# Patient Record
Sex: Male | Born: 1935 | Race: White | Hispanic: No | Marital: Married | Smoking: Former smoker
Health system: Southern US, Community
[De-identification: ages and names within clinical notes are randomized; demographics above are authoritative.]

## PROBLEM LIST (undated history)

## (undated) DIAGNOSIS — E119 Type 2 diabetes mellitus without complications: Secondary | ICD-10-CM

## (undated) DIAGNOSIS — R112 Nausea with vomiting, unspecified: Secondary | ICD-10-CM

## (undated) DIAGNOSIS — I714 Abdominal aortic aneurysm, without rupture, unspecified: Secondary | ICD-10-CM

## (undated) DIAGNOSIS — I639 Cerebral infarction, unspecified: Secondary | ICD-10-CM

## (undated) DIAGNOSIS — I10 Essential (primary) hypertension: Secondary | ICD-10-CM

## (undated) DIAGNOSIS — C801 Malignant (primary) neoplasm, unspecified: Secondary | ICD-10-CM

## (undated) DIAGNOSIS — I219 Acute myocardial infarction, unspecified: Secondary | ICD-10-CM

## (undated) DIAGNOSIS — I2510 Atherosclerotic heart disease of native coronary artery without angina pectoris: Secondary | ICD-10-CM

## (undated) DIAGNOSIS — Z9889 Other specified postprocedural states: Secondary | ICD-10-CM

## (undated) DIAGNOSIS — N189 Chronic kidney disease, unspecified: Secondary | ICD-10-CM

## (undated) DIAGNOSIS — I739 Peripheral vascular disease, unspecified: Secondary | ICD-10-CM

## (undated) DIAGNOSIS — S02609A Fracture of mandible, unspecified, initial encounter for closed fracture: Secondary | ICD-10-CM

## (undated) DIAGNOSIS — I701 Atherosclerosis of renal artery: Secondary | ICD-10-CM

## (undated) HISTORY — PX: MANDIBLE FRACTURE SURGERY: SHX706

## (undated) HISTORY — PX: RENAL ARTERY STENT: SHX2321

---

## 1974-11-14 DIAGNOSIS — C801 Malignant (primary) neoplasm, unspecified: Secondary | ICD-10-CM

## 1974-11-14 HISTORY — DX: Malignant (primary) neoplasm, unspecified: C80.1

## 2008-11-14 HISTORY — PX: CORONARY ARTERY BYPASS GRAFT: SHX141

## 2009-06-11 ENCOUNTER — Inpatient Hospital Stay: Payer: Self-pay | Admitting: Internal Medicine

## 2009-09-23 ENCOUNTER — Encounter: Payer: Self-pay | Admitting: Internal Medicine

## 2009-10-14 ENCOUNTER — Encounter: Payer: Self-pay | Admitting: Internal Medicine

## 2009-11-14 ENCOUNTER — Encounter: Payer: Self-pay | Admitting: Internal Medicine

## 2009-11-14 DIAGNOSIS — N189 Chronic kidney disease, unspecified: Secondary | ICD-10-CM

## 2009-11-14 HISTORY — DX: Chronic kidney disease, unspecified: N18.9

## 2012-04-04 DIAGNOSIS — I739 Peripheral vascular disease, unspecified: Secondary | ICD-10-CM | POA: Insufficient documentation

## 2012-04-04 DIAGNOSIS — M1990 Unspecified osteoarthritis, unspecified site: Secondary | ICD-10-CM | POA: Insufficient documentation

## 2012-04-04 DIAGNOSIS — E785 Hyperlipidemia, unspecified: Secondary | ICD-10-CM | POA: Insufficient documentation

## 2012-04-04 DIAGNOSIS — Z85820 Personal history of malignant melanoma of skin: Secondary | ICD-10-CM | POA: Insufficient documentation

## 2012-04-04 DIAGNOSIS — I2510 Atherosclerotic heart disease of native coronary artery without angina pectoris: Secondary | ICD-10-CM | POA: Insufficient documentation

## 2012-04-04 DIAGNOSIS — J329 Chronic sinusitis, unspecified: Secondary | ICD-10-CM | POA: Insufficient documentation

## 2012-04-04 DIAGNOSIS — I714 Abdominal aortic aneurysm, without rupture: Secondary | ICD-10-CM | POA: Insufficient documentation

## 2012-04-04 DIAGNOSIS — K5790 Diverticulosis of intestine, part unspecified, without perforation or abscess without bleeding: Secondary | ICD-10-CM | POA: Insufficient documentation

## 2012-04-04 DIAGNOSIS — I701 Atherosclerosis of renal artery: Secondary | ICD-10-CM | POA: Insufficient documentation

## 2012-04-04 DIAGNOSIS — Z87891 Personal history of nicotine dependence: Secondary | ICD-10-CM | POA: Insufficient documentation

## 2012-05-14 ENCOUNTER — Other Ambulatory Visit: Payer: Self-pay | Admitting: Sports Medicine

## 2012-05-14 LAB — SYNOVIAL FLUID, CRYSTAL: Crystals, Joint Fluid: NONE SEEN

## 2012-05-14 LAB — BODY FLUID CELL COUNT WITH DIFFERENTIAL
Basophil: 0 %
Eosinophil: 0 %
Lymphocytes: 9 %
Neutrophils: 78 %
Nucleated Cell Count: 918 /mm3
Other Cells BF: 0 %
Other Mononuclear Cells: 13 %

## 2012-05-18 LAB — BODY FLUID CULTURE

## 2012-09-24 ENCOUNTER — Other Ambulatory Visit: Payer: Self-pay | Admitting: Sports Medicine

## 2012-09-28 LAB — WOUND CULTURE

## 2012-11-14 HISTORY — PX: CATARACT EXTRACTION: SUR2

## 2012-12-04 ENCOUNTER — Other Ambulatory Visit: Payer: Self-pay | Admitting: Podiatry

## 2012-12-08 LAB — WOUND CULTURE

## 2015-07-02 ENCOUNTER — Encounter
Admission: RE | Admit: 2015-07-02 | Discharge: 2015-07-02 | Disposition: A | Discharge status: Home / Self Care | Payer: Medicare Other | Source: Ambulatory Visit | Attending: Surgery | Admitting: Surgery

## 2015-07-02 DIAGNOSIS — K4090 Unilateral inguinal hernia, without obstruction or gangrene, not specified as recurrent: Secondary | ICD-10-CM | POA: Insufficient documentation

## 2015-07-02 DIAGNOSIS — Z01818 Encounter for other preprocedural examination: Secondary | ICD-10-CM | POA: Diagnosis not present

## 2015-07-02 HISTORY — DX: Abdominal aortic aneurysm, without rupture: I71.4

## 2015-07-02 HISTORY — DX: Type 2 diabetes mellitus without complications: E11.9

## 2015-07-02 HISTORY — DX: Other specified postprocedural states: Z98.890

## 2015-07-02 HISTORY — DX: Fracture of mandible, unspecified, initial encounter for closed fracture: S02.609A

## 2015-07-02 HISTORY — DX: Acute myocardial infarction, unspecified: I21.9

## 2015-07-02 HISTORY — DX: Peripheral vascular disease, unspecified: I73.9

## 2015-07-02 HISTORY — DX: Malignant (primary) neoplasm, unspecified: C80.1

## 2015-07-02 HISTORY — DX: Essential (primary) hypertension: I10

## 2015-07-02 HISTORY — DX: Chronic kidney disease, unspecified: N18.9

## 2015-07-02 HISTORY — DX: Nausea with vomiting, unspecified: R11.2

## 2015-07-02 HISTORY — DX: Atherosclerotic heart disease of native coronary artery without angina pectoris: I25.10

## 2015-07-02 HISTORY — DX: Atherosclerosis of renal artery: I70.1

## 2015-07-02 HISTORY — DX: Abdominal aortic aneurysm, without rupture, unspecified: I71.40

## 2015-07-02 LAB — CBC
HCT: 45.8 % (ref 40.0–52.0)
HEMOGLOBIN: 15 g/dL (ref 13.0–18.0)
MCH: 29.4 pg (ref 26.0–34.0)
MCHC: 32.8 g/dL (ref 32.0–36.0)
MCV: 89.7 fL (ref 80.0–100.0)
PLATELETS: 141 10*3/uL — AB (ref 150–440)
RBC: 5.11 MIL/uL (ref 4.40–5.90)
RDW: 15.9 % — ABNORMAL HIGH (ref 11.5–14.5)
WBC: 6.7 10*3/uL (ref 3.8–10.6)

## 2015-07-02 LAB — BASIC METABOLIC PANEL
Anion gap: 7 (ref 5–15)
BUN: 25 mg/dL — AB (ref 6–20)
CHLORIDE: 106 mmol/L (ref 101–111)
CO2: 28 mmol/L (ref 22–32)
Calcium: 9.3 mg/dL (ref 8.9–10.3)
Creatinine, Ser: 1.55 mg/dL — ABNORMAL HIGH (ref 0.61–1.24)
GFR calc non Af Amer: 42 mL/min — ABNORMAL LOW (ref >60)
GFR, EST AFRICAN AMERICAN: 48 mL/min — AB (ref >60)
Glucose, Bld: 129 mg/dL — ABNORMAL HIGH (ref 65–99)
POTASSIUM: 4.2 mmol/L (ref 3.5–5.1)
SODIUM: 141 mmol/L (ref 135–145)

## 2015-07-02 LAB — DIFFERENTIAL
Basophils Absolute: 0 10*3/uL (ref 0–0.1)
Basophils Relative: 1 %
Eosinophils Absolute: 0.1 10*3/uL (ref 0–0.7)
Eosinophils Relative: 2 %
LYMPHS PCT: 27 %
Lymphs Abs: 1.8 10*3/uL (ref 1.0–3.6)
MONO ABS: 0.7 10*3/uL (ref 0.2–1.0)
Monocytes Relative: 10 %
NEUTROS ABS: 4.1 10*3/uL (ref 1.4–6.5)
Neutrophils Relative %: 60 %

## 2015-07-02 NOTE — Patient Instructions (Signed)
  Your procedure is scheduled on: @ADMITDT2 @ August 25 Report to .[facility] Entrance To find out your arrival time please call [phone] between 1PM - 3PM on August 24.  Remember: Instructions that are not followed completely may result in serious medical risk, up to and including death, or upon the discretion of your surgeon and anesthesiologist your surgery may need to be rescheduled.    ___x_ 1. Do not eat food or drink liquids after midnight. No gum chewing or hard candies.     ___x_ 2. No Alcohol for 24 hours before or after surgery.   ____ 3. Bring all medications with you on the day of surgery if instructed.    ___x_ 4. Notify your doctor if there is any change in your medical condition     (cold, fever, infections).     Do not wear jewelry, make-up, hairpins, clips or nail polish.  Do not wear lotions, powders, or perfumes. You may wear deodorant.  Do not shave 48 hours prior to surgery. Men may shave face and neck.  Do not bring valuables to the hospital.    [facility] is not responsible for any belongings or valuables.               Contacts, dentures or bridgework may not be worn into surgery.  Leave your suitcase in the car. After surgery it may be brought to your room.  For patients admitted to the hospital, discharge time is determined by your                treatment team.   Patients discharged the day of surgery will not be allowed to drive home.   Please read over the following fact sheets that you were given:      ___x_ Take these medicines the morning of surgery with A SIP OF WATER:    1. Amlodipine  2. Carvedilol  3.   4.  5.  6.  ____ Fleet Enema (as directed)   __x__ Use CHG Soap as directed  ____ Use inhalers on the day of surgery  ____ Stop metformin 2 days prior to surgery    ____ Take 1/2 of usual insulin dose the night before surgery and none on the morning of surgery.   ___x_ Stop Coumadin/Plavix/aspirin on July 02 2015  ___x_  Stop Anti-inflammatories on July 02 2015   __x__ Stop supplements until after surgery.  Ocuvite  ____ Bring C-Pap to the hospital.

## 2015-07-02 NOTE — OR Nursing (Signed)
CLEARED BY DR Ubaldo Glassing 06/19/15

## 2015-07-09 ENCOUNTER — Encounter: Payer: Self-pay | Admitting: *Deleted

## 2015-07-09 ENCOUNTER — Ambulatory Visit: Payer: Medicare Other | Admitting: Anesthesiology

## 2015-07-09 ENCOUNTER — Ambulatory Visit
Admission: RE | Admit: 2015-07-09 | Discharge: 2015-07-09 | Disposition: A | Discharge status: Home / Self Care | Payer: Medicare Other | Source: Ambulatory Visit | Attending: Surgery | Admitting: Surgery

## 2015-07-09 ENCOUNTER — Encounter
Admission: RE | Disposition: A | Discharge status: Home / Self Care | Payer: Self-pay | Source: Ambulatory Visit | Attending: Surgery

## 2015-07-09 DIAGNOSIS — E119 Type 2 diabetes mellitus without complications: Secondary | ICD-10-CM | POA: Insufficient documentation

## 2015-07-09 DIAGNOSIS — I2510 Atherosclerotic heart disease of native coronary artery without angina pectoris: Secondary | ICD-10-CM | POA: Insufficient documentation

## 2015-07-09 DIAGNOSIS — K4090 Unilateral inguinal hernia, without obstruction or gangrene, not specified as recurrent: Secondary | ICD-10-CM | POA: Diagnosis not present

## 2015-07-09 DIAGNOSIS — Z87891 Personal history of nicotine dependence: Secondary | ICD-10-CM | POA: Diagnosis not present

## 2015-07-09 DIAGNOSIS — I10 Essential (primary) hypertension: Secondary | ICD-10-CM | POA: Insufficient documentation

## 2015-07-09 DIAGNOSIS — I252 Old myocardial infarction: Secondary | ICD-10-CM | POA: Insufficient documentation

## 2015-07-09 DIAGNOSIS — I701 Atherosclerosis of renal artery: Secondary | ICD-10-CM | POA: Insufficient documentation

## 2015-07-09 HISTORY — PX: INGUINAL HERNIA REPAIR: SHX194

## 2015-07-09 LAB — GLUCOSE, CAPILLARY
GLUCOSE-CAPILLARY: 81 mg/dL (ref 65–99)
GLUCOSE-CAPILLARY: 111 mg/dL — AB (ref 65–99)

## 2015-07-09 SURGERY — REPAIR, HERNIA, INGUINAL, ADULT
Anesthesia: General | Site: Groin | Laterality: Left | Wound class: Clean

## 2015-07-09 MED ORDER — BUPIVACAINE-EPINEPHRINE (PF) 0.5% -1:200000 IJ SOLN
INTRAMUSCULAR | Status: AC
  Filled 2015-07-09: qty 30

## 2015-07-09 MED ORDER — ROCURONIUM BROMIDE 100 MG/10ML IV SOLN
INTRAVENOUS | Status: DC | PRN
  Administered 2015-07-09: 5 mg via INTRAVENOUS

## 2015-07-09 MED ORDER — EPHEDRINE SULFATE 50 MG/ML IJ SOLN
INTRAMUSCULAR | Status: DC | PRN
  Administered 2015-07-09: 5 mg via INTRAVENOUS

## 2015-07-09 MED ORDER — FAMOTIDINE 20 MG PO TABS
ORAL_TABLET | ORAL | Status: AC
  Administered 2015-07-09: 20 mg via ORAL
  Filled 2015-07-09: qty 1

## 2015-07-09 MED ORDER — FAMOTIDINE 20 MG PO TABS
20.0000 mg | ORAL_TABLET | Freq: Once | ORAL | Status: AC
  Administered 2015-07-09: 20 mg via ORAL

## 2015-07-09 MED ORDER — ONDANSETRON HCL 4 MG/2ML IJ SOLN
INTRAMUSCULAR | Status: DC | PRN
  Administered 2015-07-09: 4 mg via INTRAVENOUS

## 2015-07-09 MED ORDER — SODIUM CHLORIDE 0.9 % IV SOLN
INTRAVENOUS | Status: DC
  Administered 2015-07-09: 14:00:00 via INTRAVENOUS

## 2015-07-09 MED ORDER — BUPIVACAINE-EPINEPHRINE 0.5% -1:200000 IJ SOLN
INTRAMUSCULAR | Status: DC | PRN
  Administered 2015-07-09: 20 mL

## 2015-07-09 MED ORDER — CEFAZOLIN SODIUM 1-5 GM-% IV SOLN
INTRAVENOUS | Status: AC
  Filled 2015-07-09: qty 50

## 2015-07-09 MED ORDER — PROPOFOL 10 MG/ML IV BOLUS
INTRAVENOUS | Status: DC | PRN
  Administered 2015-07-09: 40 mg via INTRAVENOUS
  Administered 2015-07-09: 120 mg via INTRAVENOUS

## 2015-07-09 MED ORDER — SUCCINYLCHOLINE CHLORIDE 20 MG/ML IJ SOLN
INTRAMUSCULAR | Status: DC | PRN
  Administered 2015-07-09: 100 mg via INTRAVENOUS

## 2015-07-09 MED ORDER — FENTANYL CITRATE (PF) 100 MCG/2ML IJ SOLN
25.0000 ug | INTRAMUSCULAR | Status: DC | PRN
  Administered 2015-07-09 (×4): 25 ug via INTRAVENOUS

## 2015-07-09 MED ORDER — GLYCOPYRROLATE 0.2 MG/ML IJ SOLN
INTRAMUSCULAR | Status: DC | PRN
  Administered 2015-07-09: 0.1 mg via INTRAVENOUS

## 2015-07-09 MED ORDER — FENTANYL CITRATE (PF) 100 MCG/2ML IJ SOLN
INTRAMUSCULAR | Status: AC
  Administered 2015-07-09: 25 ug via INTRAVENOUS
  Filled 2015-07-09: qty 2

## 2015-07-09 MED ORDER — ONDANSETRON HCL 4 MG/2ML IJ SOLN: 4.0000 mg | Freq: Once | INTRAMUSCULAR | Status: DC | PRN

## 2015-07-09 MED ORDER — LIDOCAINE HCL (CARDIAC) 20 MG/ML IV SOLN
INTRAVENOUS | Status: DC | PRN
  Administered 2015-07-09: 100 mg via INTRAVENOUS

## 2015-07-09 MED ORDER — FENTANYL CITRATE (PF) 100 MCG/2ML IJ SOLN
INTRAMUSCULAR | Status: DC | PRN
  Administered 2015-07-09: 25 ug via INTRAVENOUS
  Administered 2015-07-09: 50 ug via INTRAVENOUS
  Administered 2015-07-09: 25 ug via INTRAVENOUS
  Administered 2015-07-09: 50 ug via INTRAVENOUS

## 2015-07-09 MED ORDER — HYDROCODONE-ACETAMINOPHEN 5-325 MG PO TABS: 1.0000 | ORAL_TABLET | ORAL | Status: DC | PRN

## 2015-07-09 MED ORDER — MIDAZOLAM HCL 2 MG/2ML IJ SOLN
INTRAMUSCULAR | Status: DC | PRN
  Administered 2015-07-09: 2 mg via INTRAVENOUS

## 2015-07-09 MED ORDER — DEXTROSE 5 % IV SOLN
1000.0000 mg | Freq: Once | INTRAVENOUS | Status: AC
  Administered 2015-07-09: 1000 mg via INTRAVENOUS

## 2015-07-09 SURGICAL SUPPLY — 24 items
BLADE SURG 15 STRL LF DISP TIS (BLADE) ×2 IMPLANT
BLADE SURG 15 STRL SS (BLADE) ×4
CANISTER SUCT 1200ML W/VALVE (MISCELLANEOUS) ×3 IMPLANT
CHLORAPREP W/TINT 26ML (MISCELLANEOUS) ×3 IMPLANT
DRAIN PENROSE 5/8X18 LTX STRL (WOUND CARE) ×3 IMPLANT
DRAPE PED LAPAROTOMY (DRAPES) ×3 IMPLANT
GLOVE BIO SURGEON STRL SZ7.5 (GLOVE) ×3 IMPLANT
GOWN STRL REUS W/ TWL LRG LVL3 (GOWN DISPOSABLE) ×2 IMPLANT
GOWN STRL REUS W/TWL LRG LVL3 (GOWN DISPOSABLE) ×4
KIT RM TURNOVER STRD PROC AR (KITS) ×3 IMPLANT
LABEL OR SOLS (LABEL) ×3 IMPLANT
LIQUID BAND (GAUZE/BANDAGES/DRESSINGS) ×3 IMPLANT
MESH SYNTHETIC 4X6 SOFT BARD (Mesh General) ×1 IMPLANT
MESH SYNTHETIC SOFT BARD 4X6 (Mesh General) ×2 IMPLANT
NEEDLE HYPO 25X1 1.5 SAFETY (NEEDLE) ×3 IMPLANT
NS IRRIG 500ML POUR BTL (IV SOLUTION) ×3 IMPLANT
PACK BASIN MINOR ARMC (MISCELLANEOUS) ×3 IMPLANT
PAD GROUND ADULT SPLIT (MISCELLANEOUS) ×3 IMPLANT
SUT CHROMIC 4 0 RB 1X27 (SUTURE) ×6 IMPLANT
SUT MNCRL AB 4-0 PS2 18 (SUTURE) ×3 IMPLANT
SUT SURGILON 0 30 BLK (SUTURE) ×9 IMPLANT
SUT VIC AB 4-0 SH 27 (SUTURE) ×4
SUT VIC AB 4-0 SH 27XANBCTRL (SUTURE) ×2 IMPLANT
SYRINGE 10CC LL (SYRINGE) ×3 IMPLANT

## 2015-07-09 NOTE — Transfer of Care (Signed)
Immediate Anesthesia Transfer of Care Note  Patient: Nathaniel Gamble  Procedure(s) Performed: Procedure(s): HERNIA REPAIR INGUINAL ADULT (Left)  Patient Location: PACU  Anesthesia Type:General  Level of Consciousness: awake, alert , oriented and patient cooperative  Airway & Oxygen Therapy: Patient Spontanous Breathing and Patient connected to nasal cannula oxygen  Post-op Assessment: Report given to RN and Post -op Vital signs reviewed and stable  Post vital signs: Reviewed and stable  Last Vitals:  Filed Vitals:   07/09/15 1804  BP: 166/94  Pulse: 68  Temp:   Resp: 13    Complications: No apparent anesthesia complications

## 2015-07-09 NOTE — Anesthesia Postprocedure Evaluation (Signed)
  Anesthesia Post-op Note  Patient: Nathaniel Gamble  Procedure(s) Performed: Procedure(s): HERNIA REPAIR INGUINAL ADULT (Left)  Anesthesia type:General  Patient location: PACU  Post pain: Pain level controlled  Post assessment: Post-op Vital signs reviewed, Patient's Cardiovascular Status Stable, Respiratory Function Stable, Patent Airway and No signs of Nausea or vomiting  Post vital signs: Reviewed and stable  Last Vitals:  Filed Vitals:   07/09/15 1756  BP:   Pulse: 70  Temp:   Resp: 12    Level of consciousness: awake, alert  and patient cooperative  Complications: No apparent anesthesia complications

## 2015-07-09 NOTE — Op Note (Signed)
OPERATIVE REPORT  PREOPERATIVE DIAGNOSIS: left inguinal hernia  POSTOPERATIVE DIAGNOSIS:left  inguinal hernia  PROCEDURE:  left inguinal hernia repair  ANESTHESIA:  General  SURGEON:  Rochel Brome M.D.  INDICATIONS: He reports bulging in the left groin. A left inguinal hernia was demonstrated on physical exam and repair was recommended for definitive treatment.  With the patient on the operating table in the supine position the left lower quadrant was prepared with clippers and with ChloraPrep and draped in a sterile manner. A transversely oriented suprapubic incision was made and carried down through subcutaneous tissues. Electrocautery was used for hemostasis. The Scarpa's fascia was incised. The external oblique aponeurosis was incised along the course of its fibers to open the external ring and expose the inguinal cord structures. The cord structures were mobilized. A Penrose drain was passed around the cord structures for traction. Inspection revealed there was a large direct inguinal hernia. There was no indirect sac. The attenuated transversalis fascia was incised circumferentially and removed. The sac was approximately 7 cm in length and was inverted the fascial defect in the floor of the inguinal canal was repaired with interrupted 0 Surgilon sutures.. The conjoined tendon was sutured to the shelving edge of the inguinal ligament incorporating transversalis fascia into the repair. The last stitch led to satisfactory narrowing of the internal ring. The fascia was somewhat thin. A relaxing incision was made medially.  Bard soft mesh was cut to create an oval shape and was placed over the repair. This was sutured to the repair with interrupted 0 Surgilon sutures and also sutured medially to the deep fascia and on both sides of the internal ring. Next after seeing hemostasis was intact the cord structures were replaced along the floor of the inguinal canal. The cut edges of the external oblique  aponeurosis were closed with a running 4-0 Vicryl suture to re-create the external ring. The deep fascia superior and lateral to the repair site was infiltrated with half percent Sensorcaine with epinephrine. Subcutaneous tissues were also infiltrated. The Scarpa's fascia was closed with interrupted 4-0 Vicryl sutures. The skin was closed with running 4-0 Monocryl subcuticular suture and LiquiBand. The testicle remained in the scrotum  The patient appeared to be in satisfactory condition and was prepared for transfer to the recovery room.  Rochel Brome M.D.

## 2015-07-09 NOTE — Anesthesia Procedure Notes (Signed)
Procedure Name: Intubation Date/Time: 07/09/2015 3:50 PM Performed by: Silvana Newness Pre-anesthesia Checklist: Patient identified, Emergency Drugs available, Suction available, Patient being monitored and Timeout performed Patient Re-evaluated:Patient Re-evaluated prior to inductionOxygen Delivery Method: Circle system utilized Preoxygenation: Pre-oxygenation with 100% oxygen Intubation Type: IV induction Ventilation: Two handed mask ventilation required Laryngoscope Size: Glidescope and 4 Grade View: Grade I Tube type: Oral Tube size: 7.5 mm Number of attempts: 1 Airway Equipment and Method: Rigid stylet Placement Confirmation: ETT inserted through vocal cords under direct vision,  positive ETCO2 and breath sounds checked- equal and bilateral Secured at: 20 cm Tube secured with: Tape Dental Injury: Teeth and Oropharynx as per pre-operative assessment

## 2015-07-09 NOTE — H&P (Signed)
  He reports no change in overall condition since the day of the office visit.  The left inguinal hernia was demonstrated on physical exam.  The left side was marked yes  Plan for surgery was discussed

## 2015-07-09 NOTE — Anesthesia Preprocedure Evaluation (Addendum)
Anesthesia Evaluation  Patient identified by MRN, date of birth, ID band Patient awake    Reviewed: Allergy & Precautions, NPO status , Patient's Chart, lab work & pertinent test results, reviewed documented beta blocker date and time   History of Anesthesia Complications (+) PONV  Airway Mallampati: III  TM Distance: >3 FB   Mouth opening: Limited Mouth Opening  Dental no notable dental hx.    Pulmonary former smoker,  breath sounds clear to auscultation  Pulmonary exam normal       Cardiovascular hypertension, Pt. on medications and Pt. on home beta blockers + CAD, + Past MI and + Peripheral Vascular Disease Normal cardiovascular exam    Neuro/Psych negative neurological ROS  negative psych ROS   GI/Hepatic negative GI ROS, Neg liver ROS,   Endo/Other  diabetes, Well Controlled, Type 2  Renal/GU Renal InsufficiencyRenal diseaseRenal artery stenosis  negative genitourinary   Musculoskeletal negative musculoskeletal ROS (+)   Abdominal Normal abdominal exam  (+)   Peds negative pediatric ROS (+)  Hematology negative hematology ROS (+)   Anesthesia Other Findings   Reproductive/Obstetrics                            Anesthesia Physical Anesthesia Plan  ASA: III  Anesthesia Plan: General   Post-op Pain Management:    Induction: Intravenous  Airway Management Planned: Oral ETT  Additional Equipment:   Intra-op Plan:   Post-operative Plan: Extubation in OR  Informed Consent: I have reviewed the patients History and Physical, chart, labs and discussed the procedure including the risks, benefits and alternatives for the proposed anesthesia with the patient or authorized representative who has indicated his/her understanding and acceptance.   Dental advisory given  Plan Discussed with: CRNA and Surgeon  Anesthesia Plan Comments:         Anesthesia Quick Evaluation

## 2015-07-09 NOTE — Discharge Instructions (Addendum)
Take Tylenol or Norco if needed for pain.  Resume aspirin on Monday.  May shower.  Avoid straining and heavy lifting.general anesGeneral Anesthesia, Care After Refer to this sheet in the next few weeks. These instructions provide you with information on caring for yourself after your procedure. Your health care provider may also give you more specific instructions. Your treatment has been planned according to current medical practices, but problems sometimes occur. Call your health care provider if you have any problems or questions after your procedure. WHAT TO EXPECT AFTER THE PROCEDURE After the procedure, it is typical to experience:  Sleepiness.  Nausea and vomiting. HOME CARE INSTRUCTIONS  For the first 24 hours after general anesthesia:  Have a responsible person with you.  Do not drive a car. If you are alone, do not take public transportation.  Do not drink alcohol.  Do not take medicine that has not been prescribed by your health care provider.  Do not sign important papers or make important decisions.  You may resume a normal diet and activities as directed by your health care provider.  Change bandages (dressings) as directed.  If you have questions or problems that seem related to general anesthesia, call the hospital and ask for the anesthetist or anesthesiologist on call. SEEK MEDICAL CARE IF:  You have nausea and vomiting that continue the day after anesthesia.  You develop a rash. SEEK IMMEDIATE MEDICAL CARE IF:   You have difficulty breathing.  You have chest pain.  You have any allergic problems. Document Released: 02/06/2001 Document Revised: 11/05/2013 Document Reviewed: 05/16/2013 [facility] Patient Information 2015 [place], [place]. This information is not intended to replace advice given to you by your health care provider. Make sure you discuss any questions you have with your health care provider.

## 2015-07-10 ENCOUNTER — Encounter: Payer: Self-pay | Admitting: Surgery

## 2016-10-31 ENCOUNTER — Other Ambulatory Visit: Payer: Self-pay | Admitting: Nephrology

## 2016-10-31 DIAGNOSIS — N2889 Other specified disorders of kidney and ureter: Secondary | ICD-10-CM

## 2016-11-04 ENCOUNTER — Ambulatory Visit
Admission: RE | Admit: 2016-11-04 | Discharge: 2016-11-04 | Disposition: A | Discharge status: Home / Self Care | Payer: BLUE CROSS/BLUE SHIELD | Source: Ambulatory Visit | Attending: Nephrology | Admitting: Nephrology

## 2016-11-04 DIAGNOSIS — I728 Aneurysm of other specified arteries: Secondary | ICD-10-CM | POA: Diagnosis not present

## 2016-11-04 DIAGNOSIS — I714 Abdominal aortic aneurysm, without rupture: Secondary | ICD-10-CM | POA: Diagnosis not present

## 2016-11-04 DIAGNOSIS — K229 Disease of esophagus, unspecified: Secondary | ICD-10-CM | POA: Insufficient documentation

## 2016-11-04 DIAGNOSIS — N2889 Other specified disorders of kidney and ureter: Secondary | ICD-10-CM | POA: Diagnosis not present

## 2018-05-10 ENCOUNTER — Inpatient Hospital Stay: Payer: BLUE CROSS/BLUE SHIELD

## 2018-05-10 ENCOUNTER — Inpatient Hospital Stay
Admission: EM | Admit: 2018-05-10 | Discharge: 2018-05-11 | DRG: 066 | Disposition: A | Discharge status: Home Health | Payer: BLUE CROSS/BLUE SHIELD | Attending: Internal Medicine | Admitting: Internal Medicine

## 2018-05-10 ENCOUNTER — Encounter: Payer: Self-pay | Admitting: Emergency Medicine

## 2018-05-10 ENCOUNTER — Other Ambulatory Visit: Payer: Self-pay

## 2018-05-10 ENCOUNTER — Emergency Department: Payer: BLUE CROSS/BLUE SHIELD

## 2018-05-10 DIAGNOSIS — I2510 Atherosclerotic heart disease of native coronary artery without angina pectoris: Secondary | ICD-10-CM | POA: Diagnosis present

## 2018-05-10 DIAGNOSIS — G8324 Monoplegia of upper limb affecting left nondominant side: Secondary | ICD-10-CM | POA: Diagnosis present

## 2018-05-10 DIAGNOSIS — N183 Chronic kidney disease, stage 3 (moderate): Secondary | ICD-10-CM | POA: Diagnosis present

## 2018-05-10 DIAGNOSIS — I252 Old myocardial infarction: Secondary | ICD-10-CM

## 2018-05-10 DIAGNOSIS — E1151 Type 2 diabetes mellitus with diabetic peripheral angiopathy without gangrene: Secondary | ICD-10-CM | POA: Diagnosis present

## 2018-05-10 DIAGNOSIS — E1122 Type 2 diabetes mellitus with diabetic chronic kidney disease: Secondary | ICD-10-CM | POA: Diagnosis present

## 2018-05-10 DIAGNOSIS — Z823 Family history of stroke: Secondary | ICD-10-CM | POA: Diagnosis not present

## 2018-05-10 DIAGNOSIS — Z85820 Personal history of malignant melanoma of skin: Secondary | ICD-10-CM

## 2018-05-10 DIAGNOSIS — I10 Essential (primary) hypertension: Secondary | ICD-10-CM | POA: Diagnosis not present

## 2018-05-10 DIAGNOSIS — Z7982 Long term (current) use of aspirin: Secondary | ICD-10-CM

## 2018-05-10 DIAGNOSIS — Z87891 Personal history of nicotine dependence: Secondary | ICD-10-CM | POA: Diagnosis not present

## 2018-05-10 DIAGNOSIS — R29810 Facial weakness: Secondary | ICD-10-CM | POA: Diagnosis present

## 2018-05-10 DIAGNOSIS — E119 Type 2 diabetes mellitus without complications: Secondary | ICD-10-CM | POA: Diagnosis not present

## 2018-05-10 DIAGNOSIS — I701 Atherosclerosis of renal artery: Secondary | ICD-10-CM | POA: Diagnosis present

## 2018-05-10 DIAGNOSIS — Z9841 Cataract extraction status, right eye: Secondary | ICD-10-CM | POA: Diagnosis not present

## 2018-05-10 DIAGNOSIS — Z951 Presence of aortocoronary bypass graft: Secondary | ICD-10-CM | POA: Diagnosis not present

## 2018-05-10 DIAGNOSIS — R29702 NIHSS score 2: Secondary | ICD-10-CM | POA: Diagnosis present

## 2018-05-10 DIAGNOSIS — Z888 Allergy status to other drugs, medicaments and biological substances status: Secondary | ICD-10-CM | POA: Diagnosis not present

## 2018-05-10 DIAGNOSIS — Z9842 Cataract extraction status, left eye: Secondary | ICD-10-CM

## 2018-05-10 DIAGNOSIS — Z8679 Personal history of other diseases of the circulatory system: Secondary | ICD-10-CM | POA: Diagnosis not present

## 2018-05-10 DIAGNOSIS — E785 Hyperlipidemia, unspecified: Secondary | ICD-10-CM | POA: Diagnosis present

## 2018-05-10 DIAGNOSIS — M6281 Muscle weakness (generalized): Secondary | ICD-10-CM | POA: Diagnosis not present

## 2018-05-10 DIAGNOSIS — Z887 Allergy status to serum and vaccine status: Secondary | ICD-10-CM

## 2018-05-10 DIAGNOSIS — I129 Hypertensive chronic kidney disease with stage 1 through stage 4 chronic kidney disease, or unspecified chronic kidney disease: Secondary | ICD-10-CM | POA: Diagnosis present

## 2018-05-10 DIAGNOSIS — I639 Cerebral infarction, unspecified: Principal | ICD-10-CM | POA: Diagnosis present

## 2018-05-10 DIAGNOSIS — I6521 Occlusion and stenosis of right carotid artery: Secondary | ICD-10-CM | POA: Diagnosis not present

## 2018-05-10 LAB — COMPREHENSIVE METABOLIC PANEL
ALT: 19 U/L (ref 0–44)
AST: 25 U/L (ref 15–41)
Albumin: 4.1 g/dL (ref 3.5–5.0)
Alkaline Phosphatase: 74 U/L (ref 38–126)
Anion gap: 9 (ref 5–15)
BUN: 32 mg/dL — AB (ref 8–23)
CHLORIDE: 107 mmol/L (ref 98–111)
CO2: 21 mmol/L — ABNORMAL LOW (ref 22–32)
Calcium: 9.1 mg/dL (ref 8.9–10.3)
Creatinine, Ser: 1.3 mg/dL — ABNORMAL HIGH (ref 0.61–1.24)
GFR calc Af Amer: 59 mL/min — ABNORMAL LOW (ref >60)
GFR calc non Af Amer: 51 mL/min — ABNORMAL LOW (ref >60)
GLUCOSE: 108 mg/dL — AB (ref 70–99)
POTASSIUM: 4.3 mmol/L (ref 3.5–5.1)
Sodium: 137 mmol/L (ref 135–145)
Total Bilirubin: 0.8 mg/dL (ref 0.3–1.2)
Total Protein: 7.3 g/dL (ref 6.5–8.1)

## 2018-05-10 LAB — URINE DRUG SCREEN, QUALITATIVE (ARMC ONLY)
Amphetamines, Ur Screen: NOT DETECTED
Benzodiazepine, Ur Scrn: NOT DETECTED
COCAINE METABOLITE, UR ~~LOC~~: NOT DETECTED
Cannabinoid 50 Ng, Ur ~~LOC~~: NOT DETECTED
MDMA (ECSTASY) UR SCREEN: NOT DETECTED
METHADONE SCREEN, URINE: NOT DETECTED
Opiate, Ur Screen: NOT DETECTED
Phencyclidine (PCP) Ur S: NOT DETECTED
Tricyclic, Ur Screen: NOT DETECTED

## 2018-05-10 LAB — DIFFERENTIAL
BASOS ABS: 0 10*3/uL (ref 0–0.1)
BASOS PCT: 1 %
EOS ABS: 0.1 10*3/uL (ref 0–0.7)
Eosinophils Relative: 2 %
Lymphocytes Relative: 29 %
Lymphs Abs: 2.1 10*3/uL (ref 1.0–3.6)
Monocytes Absolute: 0.9 10*3/uL (ref 0.2–1.0)
Monocytes Relative: 12 %
NEUTROS PCT: 56 %
Neutro Abs: 4.1 10*3/uL (ref 1.4–6.5)

## 2018-05-10 LAB — PROTIME-INR
INR: 1.05
Prothrombin Time: 13.6 seconds (ref 11.4–15.2)

## 2018-05-10 LAB — URINALYSIS, ROUTINE W REFLEX MICROSCOPIC
BACTERIA UA: NONE SEEN
BILIRUBIN URINE: NEGATIVE
GLUCOSE, UA: NEGATIVE mg/dL
HGB URINE DIPSTICK: NEGATIVE
KETONES UR: NEGATIVE mg/dL
LEUKOCYTES UA: NEGATIVE
NITRITE: NEGATIVE
PROTEIN: 30 mg/dL — AB
Specific Gravity, Urine: 1.013 (ref 1.005–1.030)
Squamous Epithelial / LPF: NONE SEEN (ref 0–5)
pH: 5 (ref 5.0–8.0)

## 2018-05-10 LAB — CBC
HCT: 43.7 % (ref 40.0–52.0)
Hemoglobin: 14.8 g/dL (ref 13.0–18.0)
MCH: 30.9 pg (ref 26.0–34.0)
MCHC: 34 g/dL (ref 32.0–36.0)
MCV: 91.1 fL (ref 80.0–100.0)
PLATELETS: 138 10*3/uL — AB (ref 150–440)
RBC: 4.79 MIL/uL (ref 4.40–5.90)
RDW: 14.8 % — ABNORMAL HIGH (ref 11.5–14.5)
WBC: 7.1 10*3/uL (ref 3.8–10.6)

## 2018-05-10 LAB — APTT: APTT: 29 s (ref 24–36)

## 2018-05-10 LAB — GLUCOSE, CAPILLARY
GLUCOSE-CAPILLARY: 99 mg/dL (ref 70–99)
Glucose-Capillary: 98 mg/dL (ref 70–99)

## 2018-05-10 LAB — TROPONIN I

## 2018-05-10 MED ORDER — SIMVASTATIN 20 MG PO TABS
40.0000 mg | ORAL_TABLET | Freq: Every day | ORAL | Status: DC
  Administered 2018-05-10: 40 mg via ORAL
  Filled 2018-05-10: qty 2

## 2018-05-10 MED ORDER — AMLODIPINE BESYLATE 10 MG PO TABS
10.0000 mg | ORAL_TABLET | Freq: Every day | ORAL | Status: DC
  Administered 2018-05-11: 10:00:00 10 mg via ORAL
  Filled 2018-05-10: qty 1

## 2018-05-10 MED ORDER — ALLOPURINOL 100 MG PO TABS
100.0000 mg | ORAL_TABLET | Freq: Every day | ORAL | Status: DC
  Administered 2018-05-11: 10:00:00 100 mg via ORAL
  Filled 2018-05-10: qty 1

## 2018-05-10 MED ORDER — ASPIRIN EC 81 MG PO TBEC
81.0000 mg | DELAYED_RELEASE_TABLET | Freq: Every day | ORAL | Status: DC
  Administered 2018-05-11: 81 mg via ORAL
  Filled 2018-05-10: qty 1

## 2018-05-10 MED ORDER — IOHEXOL 350 MG/ML SOLN
40.0000 mL | Freq: Once | INTRAVENOUS | Status: AC | PRN
  Administered 2018-05-10: 40 mL via INTRAVENOUS

## 2018-05-10 MED ORDER — ASPIRIN 81 MG PO CHEW
324.0000 mg | CHEWABLE_TABLET | Freq: Once | ORAL | Status: AC
  Administered 2018-05-10: 324 mg via ORAL
  Filled 2018-05-10: qty 4

## 2018-05-10 MED ORDER — LOSARTAN POTASSIUM 50 MG PO TABS
50.0000 mg | ORAL_TABLET | Freq: Every day | ORAL | Status: DC
  Administered 2018-05-11: 50 mg via ORAL
  Filled 2018-05-10: qty 1

## 2018-05-10 MED ORDER — POLYETHYLENE GLYCOL 3350 17 G PO PACK: 17.0000 g | PACK | Freq: Every day | ORAL | Status: DC | PRN

## 2018-05-10 MED ORDER — INSULIN ASPART 100 UNIT/ML ~~LOC~~ SOLN: 0.0000 [IU] | Freq: Three times a day (TID) | SUBCUTANEOUS | Status: DC

## 2018-05-10 MED ORDER — ACETAMINOPHEN 325 MG PO TABS
650.0000 mg | ORAL_TABLET | Freq: Four times a day (QID) | ORAL | Status: DC | PRN
  Administered 2018-05-10: 650 mg via ORAL
  Filled 2018-05-10: qty 2

## 2018-05-10 MED ORDER — ACETAMINOPHEN 650 MG RE SUPP: 650.0000 mg | Freq: Four times a day (QID) | RECTAL | Status: DC | PRN

## 2018-05-10 MED ORDER — INSULIN ASPART 100 UNIT/ML ~~LOC~~ SOLN: 0.0000 [IU] | Freq: Every day | SUBCUTANEOUS | Status: DC

## 2018-05-10 MED ORDER — ENOXAPARIN SODIUM 40 MG/0.4ML ~~LOC~~ SOLN
40.0000 mg | SUBCUTANEOUS | Status: DC
  Administered 2018-05-10: 22:00:00 40 mg via SUBCUTANEOUS
  Filled 2018-05-10: qty 0.4

## 2018-05-10 MED ORDER — IOHEXOL 350 MG/ML SOLN
75.0000 mL | Freq: Once | INTRAVENOUS | Status: AC | PRN
  Administered 2018-05-10: 75 mL via INTRAVENOUS

## 2018-05-10 MED ORDER — ALBUTEROL SULFATE (2.5 MG/3ML) 0.083% IN NEBU: 2.5000 mg | INHALATION_SOLUTION | RESPIRATORY_TRACT | Status: DC | PRN

## 2018-05-10 MED ORDER — CARVEDILOL 25 MG PO TABS
25.0000 mg | ORAL_TABLET | Freq: Two times a day (BID) | ORAL | Status: DC
  Administered 2018-05-10 – 2018-05-11 (×2): 25 mg via ORAL
  Filled 2018-05-10 (×2): qty 1

## 2018-05-10 MED ORDER — SODIUM CHLORIDE 0.9% FLUSH
3.0000 mL | Freq: Two times a day (BID) | INTRAVENOUS | Status: DC
  Administered 2018-05-10 – 2018-05-11 (×3): 3 mL via INTRAVENOUS

## 2018-05-10 NOTE — ED Provider Notes (Signed)
[facility] Department Provider Note    First MD Initiated Contact with Patient 05/10/18 1109     (approximate)  I have reviewed the triage vital signs and the nursing notes.   HISTORY  Chief Complaint Weakness    HPI Nathaniel Gamble is a 82 y.o. male with a history of AAA, CAD status post CABG presents the ER with left upper extremity weakness feeling like a "wet noodle."  Patient states last time he felt strength in his arm was last night when he was watching ball games.  Denies any pain.  Still has sensation but is having difficulty gripping anything.  Does take an aspirin.  Denies any history of A. fib.  No neck pain.  No blurry vision.  No headache.  This never happened before.   Past Medical History:  Diagnosis Date  . Abdominal aortic aneurysm ([place])    monitored by dr. Ubaldo Glassing  . Abdominal aortic aneurysm ([place])   . Cancer ([place]) 1976   melanoma, under right arm, removed  . Chronic kidney disease 2011   stent right kidney  . Coronary artery disease   . Diabetes mellitus without complication ([place])   . Hypertension   . Jaw fracture ([place])   . Myocardial infarction ([place])   . Peripheral vascular disease ([place])   . PONV (postoperative nausea and vomiting)   . Renal artery stenosis ([facility])    No family history on file. Past Surgical History:  Procedure Laterality Date  . CATARACT EXTRACTION Bilateral 2014  . CORONARY ARTERY BYPASS GRAFT  2010  . INGUINAL HERNIA REPAIR Left 07/09/2015   Procedure: HERNIA REPAIR INGUINAL ADULT;  Surgeon: Leonie Green, MD;  Location: [facility] ORS;  Service: General;  Laterality: Left;  [name] MANDIBLE FRACTURE SURGERY Left   . RENAL ARTERY STENT Right    There are no active problems to display for this patient.     Prior to Admission medications   Medication Sig Start Date End Date Taking? Authorizing Provider  allopurinol (ZYLOPRIM) 100 MG tablet Take 100 mg by mouth daily.   Yes [provider]    amLODipine (NORVASC) 10 MG tablet Take 10 mg by mouth daily.   Yes [provider]  aspirin EC 81 MG tablet Take 81 mg by mouth daily.   Yes [provider]  azelastine (ASTELIN) 0.1 % nasal spray Place into both nostrils every 12 (twelve) hours as needed for rhinitis. Use in each nostril as directed   Yes [provider]  beta carotene w/minerals (OCUVITE) tablet Take 1 tablet by mouth daily.   Yes [provider]  carvedilol (COREG) 25 MG tablet Take 25 mg by mouth 2 (two) times daily with a meal.   Yes [provider]  losartan (COZAAR) 50 MG tablet Take 50 mg by mouth daily.   Yes [provider]  simvastatin (ZOCOR) 40 MG tablet Take 40 mg by mouth at bedtime.   Yes [provider]    Allergies Lisinopril and Tetanus toxoids    Social History Social History   Tobacco Use  . Smoking status: Former Research scientist (life sciences)  . Smokeless tobacco: Former Network engineer Use Topics  . Alcohol use: Yes    Alcohol/week: 0.6 oz    Types: 1 Glasses of wine per week  . Drug use: No    Review of Systems Patient denies headaches, rhinorrhea, blurry vision, numbness, shortness of breath, chest pain, edema, cough, abdominal pain, nausea, vomiting, diarrhea, dysuria, fevers,  rashes or hallucinations unless otherwise stated above in HPI. ____________________________________________   PHYSICAL EXAM:  VITAL SIGNS: Vitals:   05/10/18 1149 05/10/18 1308  BP: (!) 157/83   Pulse: 63   Resp: 17   Temp:  98.1 F (36.7 C)  SpO2: 98%     Constitutional: Alert and oriented.  Eyes: Conjunctivae are normal.  Head: Atraumatic. Nose: No congestion/rhinnorhea. Mouth/Throat: Mucous membranes are moist.   Neck: No stridor. Painless ROM.  Cardiovascular: Normal rate, regular rhythm. Grossly normal heart sounds.  Good peripheral circulation. Respiratory: Normal respiratory effort.  No retractions. Lungs CTAB. Gastrointestinal: Soft and nontender. No  distention. No abdominal bruits. No CVA tenderness. Genitourinary:  Musculoskeletal: No lower extremity tenderness nor edema.  No joint effusions. Neurologic:  Normal speech and language. 3/5 LUE grip, bicep and tricep strenght, 5/5 RUE.  SILT No facial droop.  CNi, no neglect Skin:  Skin is warm, dry and intact. No rash noted. Psychiatric: Mood and affect are normal. Speech and behavior are normal.  ____________________________________________   LABS (all labs ordered are listed, but only abnormal results are displayed)  Results for orders placed or performed during the hospital encounter of 05/10/18 (from the past 24 hour(s))  Protime-INR     Status: None   Collection Time: 05/10/18 10:42 AM  Result Value Ref Range   Prothrombin Time 13.6 11.4 - 15.2 seconds   INR 1.05   APTT     Status: None   Collection Time: 05/10/18 10:42 AM  Result Value Ref Range   aPTT 29 24 - 36 seconds  CBC     Status: Abnormal   Collection Time: 05/10/18 10:42 AM  Result Value Ref Range   WBC 7.1 3.8 - 10.6 K/uL   RBC 4.79 4.40 - 5.90 MIL/uL   Hemoglobin 14.8 13.0 - 18.0 g/dL   HCT 43.7 40.0 - 52.0 %   MCV 91.1 80.0 - 100.0 fL   MCH 30.9 26.0 - 34.0 pg   MCHC 34.0 32.0 - 36.0 g/dL   RDW 14.8 (H) 11.5 - 14.5 %   Platelets 138 (L) 150 - 440 K/uL  Differential     Status: None   Collection Time: 05/10/18 10:42 AM  Result Value Ref Range   Neutrophils Relative % 56 %   Neutro Abs 4.1 1.4 - 6.5 K/uL   Lymphocytes Relative 29 %   Lymphs Abs 2.1 1.0 - 3.6 K/uL   Monocytes Relative 12 %   Monocytes Absolute 0.9 0.2 - 1.0 K/uL   Eosinophils Relative 2 %   Eosinophils Absolute 0.1 0 - 0.7 K/uL   Basophils Relative 1 %   Basophils Absolute 0.0 0 - 0.1 K/uL  Comprehensive metabolic panel     Status: Abnormal   Collection Time: 05/10/18 10:42 AM  Result Value Ref Range   Sodium 137 135 - 145 mmol/L   Potassium 4.3 3.5 - 5.1 mmol/L   Chloride 107 98 - 111 mmol/L   CO2 21 (L) 22 - 32 mmol/L    Glucose, Bld 108 (H) 70 - 99 mg/dL   BUN 32 (H) 8 - 23 mg/dL   Creatinine, Ser 1.30 (H) 0.61 - 1.24 mg/dL   Calcium 9.1 8.9 - 10.3 mg/dL   Total Protein 7.3 6.5 - 8.1 g/dL   Albumin 4.1 3.5 - 5.0 g/dL   AST 25 15 - 41 U/L   ALT 19 0 - 44 U/L   Alkaline Phosphatase 74 38 - 126 U/L   Total Bilirubin 0.8 0.3 -  1.2 mg/dL   GFR calc non Af Amer 51 (L) >60 mL/min   GFR calc Af Amer 59 (L) >60 mL/min   Anion gap 9 5 - 15  Troponin I     Status: None   Collection Time: 05/10/18 10:42 AM  Result Value Ref Range   Troponin I <0.03 <0.03 ng/mL  Urine Drug Screen, Qualitative     Status: Abnormal   Collection Time: 05/10/18 12:41 PM  Result Value Ref Range   Tricyclic, Ur Screen NONE DETECTED NONE DETECTED   Amphetamines, Ur Screen NONE DETECTED NONE DETECTED   MDMA (Ecstasy)Ur Screen NONE DETECTED NONE DETECTED   Cocaine Metabolite,Ur [place] NONE DETECTED NONE DETECTED   Opiate, Ur Screen NONE DETECTED NONE DETECTED   Phencyclidine (PCP) Ur S NONE DETECTED NONE DETECTED   Cannabinoid 50 Ng, Ur [place] NONE DETECTED NONE DETECTED   Barbiturates, Ur Screen (A) NONE DETECTED    Result not available. Reagent lot number recalled by manufacturer.   Benzodiazepine, Ur Scrn NONE DETECTED NONE DETECTED   Methadone Scn, Ur NONE DETECTED NONE DETECTED  Urinalysis, Routine w reflex microscopic     Status: Abnormal   Collection Time: 05/10/18 12:41 PM  Result Value Ref Range   Color, Urine YELLOW (A) YELLOW   APPearance HAZY (A) CLEAR   Specific Gravity, Urine 1.013 1.005 - 1.030   pH 5.0 5.0 - 8.0   Glucose, UA NEGATIVE NEGATIVE mg/dL   Hgb urine dipstick NEGATIVE NEGATIVE   Bilirubin Urine NEGATIVE NEGATIVE   Ketones, ur NEGATIVE NEGATIVE mg/dL   Protein, ur 30 (A) NEGATIVE mg/dL   Nitrite NEGATIVE NEGATIVE   Leukocytes, UA NEGATIVE NEGATIVE   RBC / HPF 0-5 0 - 5 RBC/hpf   WBC, UA 0-5 0 - 5 WBC/hpf   Bacteria, UA NONE SEEN NONE SEEN   Squamous Epithelial / LPF NONE SEEN 0 - 5   Mucus PRESENT     Hyaline Casts, UA PRESENT    ____________________________________________  EKG My review and personal interpretation at Time: 10:52   Indication: weakness  Rate: 60  Rhythm: sinus Axis: left Other: normal intervals, no stemi ____________________________________________  RADIOLOGY  I personally reviewed all radiographic images ordered to evaluate for the above acute complaints and reviewed radiology reports and findings.  These findings were personally discussed with the patient.  Please see medical record for radiology report.  ____________________________________________   PROCEDURES  Procedure(s) performed:  Procedures    Critical Care performed: no ____________________________________________   INITIAL IMPRESSION / ASSESSMENT AND PLAN / ED COURSE  Pertinent labs & imaging results that were available during my care of the patient were reviewed by me and considered in my medical decision making (see chart for details).   DDX: cva, tia, hypoglycemia, dehydration, electrolyte abnormality, dissection, sepsis   Nathaniel Gamble is a 82 y.o. who presents to the ED with letter extremity weakness as described above.  Last known well was at 10 PM for not a candidate for systemic TPA.  Also reporting some neck pain and therefore also concerning for dissection.  CT head does not show any evidence of acute abnormality but does show significant microvascular change therefore CTA ordered.  Denies any chest pain.  Does not seem radicular.  The patient will be placed on continuous pulse oximetry and telemetry for monitoring.  Laboratory evaluation will be sent to evaluate for the above complaints.     Clinical Course as of May 10 1438  Thu May 10, 2018  1333 I  discussed case with Dr.Reynolds who has recommended CT perfusion scan to further evaluate.  On further discussion with patient feels like he might be getting a little bit more strength though on exam it does not seem to be any  different.  Is no evidence of dissection on CTA.  Further questioning patient states that he felt like he was still having some strength at 2 AM this morning.   [PR]  1439 Perfusion CT reviewed.  Patient evaluated by Dr. Doy Mince at bedside.  At this point patient stable for admission to this facility for further stroke work-up.  No evidence of LVO.  Will need evaluation by vascular at some point as well.  Patient given aspirin.  Remains [place] dynamically stable.  Have discussed with the patient and available family all diagnostics and treatments performed thus far and all questions were answered to the best of my ability. The patient demonstrates understanding and agreement with plan.    [PR]    Clinical Course User Index [PR] Merlyn Lot, MD     As part of my medical decision making, I reviewed the following data within the [facility] notes reviewed and incorporated, Labs reviewed, notes from prior ED visits and [place] Controlled Substance Database   ____________________________________________   FINAL CLINICAL IMPRESSION(S) / ED DIAGNOSES  Final diagnoses:  Cerebrovascular accident (CVA), unspecified mechanism ([place])  Muscle weakness of left upper extremity      NEW MEDICATIONS STARTED DURING THIS VISIT:  New Prescriptions   No medications on file     Note:  This document was prepared using Dragon voice recognition software and may include unintentional dictation errors.    Merlyn Lot, MD 05/10/18 [id number]

## 2018-05-10 NOTE — ED Notes (Signed)
Patient transported to CT 

## 2018-05-10 NOTE — ED Triage Notes (Signed)
Left arm weakness. Noticed about 4am when  He got up during night. Last known well about 10pm.  Says no problem with walking or legs.  Facial--he may have very slight droop on left side. No diff with speech.

## 2018-05-10 NOTE — Consult Note (Addendum)
Referring Physician: Quentin Cornwall    Chief Complaint: LUE weakness  HPI: Nathaniel Gamble is an 82 y.o. male with a history of CAD who reports going to bed last evening and being at baseline.  Awakened at 0400 to use the bathroom and noted that his left arm was heavy and hard to use.  With no improvement as the day progressed presented for evaluation.  Initial NIHSS of 2.  Date last known well: Date: 05/09/2018 Time last known well: Time: 23:30 tPA Given: No: Outside time window  Past Medical History:  Diagnosis Date  . Abdominal aortic aneurysm ([place])    monitored by dr. Ubaldo Glassing  . Abdominal aortic aneurysm ([place])   . Cancer ([place]) 1976   melanoma, under right arm, removed  . Chronic kidney disease 2011   stent right kidney  . Coronary artery disease   . Diabetes mellitus without complication ([place])   . Hypertension   . Jaw fracture ([place])   . Myocardial infarction ([place])   . Peripheral vascular disease ([place])   . PONV (postoperative nausea and vomiting)   . Renal artery stenosis [facility])     Past Surgical History:  Procedure Laterality Date  . CATARACT EXTRACTION Bilateral 2014  . CORONARY ARTERY BYPASS GRAFT  2010  . INGUINAL HERNIA REPAIR Left 07/09/2015   Procedure: HERNIA REPAIR INGUINAL ADULT;  Surgeon: Leonie Green, MD;  Location: [facility] ORS;  Service: General;  Laterality: Left;  [name] MANDIBLE FRACTURE SURGERY Left   . RENAL ARTERY STENT Right     Family history: Parents deceased.  Mother with a stroke.   Social History:  reports that he has quit smoking. He has quit using smokeless tobacco. He reports that he drinks about 0.6 oz of alcohol per week. He reports that he does not use drugs.  Allergies:  Allergies  Allergen Reactions  . Lisinopril Swelling  . Tetanus Toxoids Swelling    Medications:  I have reviewed the patient's current medications. Prior to Admission:  Medications Prior to Admission  Medication Sig Dispense Refill Last Dose  . allopurinol (ZYLOPRIM) 100  MG tablet Take 100 mg by mouth daily.   05/10/2018 at am  . amLODipine (NORVASC) 10 MG tablet Take 10 mg by mouth daily.   05/10/2018 at am  . aspirin EC 81 MG tablet Take 81 mg by mouth daily.   05/10/2018 at 0800  . azelastine (ASTELIN) 0.1 % nasal spray Place into both nostrils every 12 (twelve) hours as needed for rhinitis. Use in each nostril as directed   PRN at PRN  . beta carotene w/minerals (OCUVITE) tablet Take 1 tablet by mouth daily.   05/10/2018 at am  . carvedilol (COREG) 25 MG tablet Take 25 mg by mouth 2 (two) times daily with a meal.   05/10/2018 at 0800  . losartan (COZAAR) 50 MG tablet Take 50 mg by mouth daily.   05/10/2018 at am  . simvastatin (ZOCOR) 40 MG tablet Take 40 mg by mouth at bedtime.   05/09/2018 at pm   Scheduled: . allopurinol  100 mg Oral Daily  . amLODipine  10 mg Oral Daily  . [START ON 05/11/2018] aspirin EC  81 mg Oral Daily  . carvedilol  25 mg Oral BID WC  . enoxaparin (LOVENOX) injection  40 mg Subcutaneous Q24H  . insulin aspart  0-5 Units Subcutaneous QHS  . insulin aspart  0-9 Units Subcutaneous TID WC  . [START ON 05/11/2018] losartan  50 mg Oral Daily  . simvastatin  40 mg Oral QHS  . sodium chloride flush  3 mL Intravenous Q12H    ROS: History obtained from the patient  General ROS: negative for - chills, fatigue, fever, night sweats, weight gain or weight loss Psychological ROS: negative for - behavioral disorder, hallucinations, memory difficulties, mood swings or suicidal ideation Ophthalmic ROS: negative for - blurry vision, double vision, eye pain or loss of vision ENT ROS: negative for - epistaxis, nasal discharge, oral lesions, sore throat, tinnitus or vertigo Allergy and Immunology ROS: negative for - hives or itchy/watery eyes Hematological and Lymphatic ROS: negative for - bleeding problems, bruising or swollen lymph nodes Endocrine ROS: negative for - galactorrhea, hair pattern changes, polydipsia/polyuria or temperature  intolerance Respiratory ROS: negative for - cough, hemoptysis, shortness of breath or wheezing Cardiovascular ROS: left leg swelling Gastrointestinal ROS: negative for - abdominal pain, diarrhea, hematemesis, nausea/vomiting or stool incontinence Genito-Urinary ROS: negative for - dysuria, hematuria, incontinence or urinary frequency/urgency Musculoskeletal ROS: negative for - joint swelling or muscular weakness Neurological ROS: as noted in HPI Dermatological ROS: negative for rash and skin lesion changes  Physical Examination: Blood pressure (!) 174/72, pulse 60, temperature (!) 97.5 F (36.4 C), temperature source Oral, resp. rate 18, height 5\' 10"  (1.778 m), weight 86.2 kg (190 lb), SpO2 97 %.  HEENT-  Normocephalic, no lesions, without obvious abnormality.  Normal external eye and conjunctiva.  Normal TM's bilaterally.  Normal auditory canals and external ears. Normal external nose, mucus membranes and septum.  Normal pharynx. Cardiovascular- S1, S2 normal, pulses palpable throughout   Lungs- chest clear, no wheezing, rales, normal symmetric air entry Abdomen- soft, non-tender; bowel sounds normal; no masses,  no organomegaly Extremities- mild LLE edema Lymph-no adenopathy palpable Musculoskeletal-no joint tenderness, deformity or swelling Skin-warm and dry, no hyperpigmentation, vitiligo, or suspicious lesions  Neurological Examination   Mental Status: Alert, oriented, thought content appropriate.  Speech fluent without evidence of aphasia.  Able to follow 3 step commands without difficulty. Cranial Nerves: II: Discs flat bilaterally; Visual fields grossly normal, pupils equal, round, reactive to light and accommodation III,IV, VI: ptosis not present, extra-ocular motions intact bilaterally V,VII: mild left facial droop, facial light touch sensation normal bilaterally VIII: hearing normal bilaterally IX,X: gag reflex present XI: bilateral shoulder shrug XII: midline tongue  extension Motor: Right : Upper extremity   5/5    Left:     Upper extremity   2-3/5  Lower extremity   5/5     Lower extremity   5/5 Tone and bulk:normal tone throughout; no atrophy noted Sensory: Pinprick and light touch intact throughout, bilaterally Deep Tendon Reflexes: 2+ and symmetric with absent AJ's bilaterally Plantars: Right: downgoing   Left: downgoing Cerebellar: Normal finger-to-nose testing on the right and normal heel-to-shin testing bilaterally.  Unable to perform on the left due to weakness Gait: not tested due to safety concerns    Laboratory Studies:  Basic Metabolic Panel: Recent Labs  Lab 05/10/18 1042  NA 137  K 4.3  CL 107  CO2 21*  GLUCOSE 108*  BUN 32*  CREATININE 1.30*  CALCIUM 9.1    Liver Function Tests: Recent Labs  Lab 05/10/18 1042  AST 25  ALT 19  ALKPHOS 74  BILITOT 0.8  PROT 7.3  ALBUMIN 4.1   No results for input(s): LIPASE, AMYLASE in the last 168 hours. No results for input(s): AMMONIA in the last 168 hours.  CBC: Recent Labs  Lab 05/10/18 1042  WBC 7.1  NEUTROABS 4.1  HGB  14.8  HCT 43.7  MCV 91.1  PLT 138*    Cardiac Enzymes: Recent Labs  Lab 05/10/18 1042  TROPONINI <0.03    BNP: Invalid input(s): POCBNP  CBG: No results for input(s): GLUCAP in the last 168 hours.  Microbiology: No results found for this or any previous visit.  Coagulation Studies: Recent Labs    05/10/18 1042  LABPROT 13.6  INR 1.05    Urinalysis:  Recent Labs  Lab 05/10/18 1241  COLORURINE YELLOW*  LABSPEC 1.013  PHURINE 5.0  GLUCOSEU NEGATIVE  HGBUR NEGATIVE  BILIRUBINUR NEGATIVE  KETONESUR NEGATIVE  PROTEINUR 30*  NITRITE NEGATIVE  LEUKOCYTESUR NEGATIVE    Lipid Panel: No results found for: CHOL, TRIG, HDL, CHOLHDL, VLDL, LDLCALC  HgbA1C: No results found for: HGBA1C  Urine Drug Screen:      Component Value Date/Time   LABOPIA NONE DETECTED 05/10/2018 1241   COCAINSCRNUR NONE DETECTED 05/10/2018 1241    LABBENZ NONE DETECTED 05/10/2018 1241   AMPHETMU NONE DETECTED 05/10/2018 1241   THCU NONE DETECTED 05/10/2018 1241   LABBARB (A) 05/10/2018 1241    Result not available. Reagent lot number recalled by manufacturer.    Alcohol Level: No results for input(s): ETH in the last 168 hours.  Other results: EKG: sinus rhythm at 59 bpm.  Imaging: Ct Angio Head W Or Wo Contrast  Result Date: 05/10/2018 CLINICAL DATA:  Left arm weakness beginning 0400 hours EXAM: CT ANGIOGRAPHY HEAD AND NECK TECHNIQUE: Multidetector CT imaging of the head and neck was performed using the standard protocol during bolus administration of intravenous contrast. Multiplanar CT image reconstructions and MIPs were obtained to evaluate the vascular anatomy. Carotid stenosis measurements (when applicable) are obtained utilizing NASCET criteria, using the distal internal carotid diameter as the denominator. CONTRAST:  42mL OMNIPAQUE IOHEXOL 350 MG/ML SOLN COMPARISON:  Head CT same day. FINDINGS: CTA NECK FINDINGS Aortic arch: Aortic atherosclerosis without aneurysm or dissection. Branching pattern of the brachiocephalic vessels is normal without origin stenosis. Right carotid system: Common carotid artery is tortuous but widely patent to the bifurcation region. There is soft and calcified plaque at the carotid bifurcation. Minimal diameter of the proximal ICA is 4 mm. Compared to a more distal cervical ICA diameter of 5 mm, this indicates a 20% stenosis. The distal cervical internal carotid artery beginning at about the C2-3 level is markedly abnormal. 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 Narrowing in this segment is estimated at 30-50%. Left carotid system: Common carotid artery is tortuous but sufficiently patent to the bifurcation. At the carotid bifurcation,  there is atherosclerotic disease with soft and calcified plaque. Minimal diameter of the proximal ICA is 3.5 mm. Compared to a more distal cervical ICA diameter of 5 mm, this indicates a 20% stenosis. Similarly on this side, the upper cervical internal carotid artery is abnormal with calcification and irregularity. This could be due to chronic dissection or simple atherosclerosis. Minimal diameter of the vessel just proximal to the skull base is 1 mm or less. This places the patient at risk left ICA complete occlusion. Vertebral arteries: Both vertebral artery origins show some atherosclerotic change but are sufficiently patent. Both vertebral arteries are patent through the cervical region to the foramen magnum. Skeleton: Mild cervical spondylosis. Other neck: No soft tissue mass or lymphadenopathy. Right submandibular gland is either atrophic or surgically absent. Upper chest: Lung  apices are clear. Review of the MIP images confirms the above findings CTA HEAD FINDINGS Anterior circulation: Both internal carotid arteries show flow through the carotid siphon region. There is extensive siphon calcification. Stenosis in that region is estimated at 50% on each side. Supraclinoid internal carotid arteries are widely patent. The anterior and middle cerebral vessels show flow without proximal stenosis, aneurysm or vascular malformation. No missing larger vessels are identified. Posterior circulation: Both vertebral arteries are patent at foramen magnum. There is a 70% stenosis of the right V4 segment, but the vessel does show flow distal to that to the basilar. There is a 50% stenosis of the left V4 segment, but the vessel is also patent to the basilar. No basilar stenosis. Posterior circulation branch vessels show flow. Venous sinuses: Patent and normal. Anatomic variants: None significant. Delayed phase: No abnormal enhancement. Review of the MIP images confirms the above findings IMPRESSION: Atherosclerotic disease at  both carotid bifurcation regions and ICA bulb. 20% stenosis in the ICA bulb region on each side. Abnormal distal cervical internal carotid arteries on each side. 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 There is narrowing and irregularity of the upper cervical internal carotid artery, narrowing in that region estimated at 30-50%. On the left, I do not see a pseudo aneurysm, but there is more severe narrowing and irregularity of the upper cervical ICA, with the lumen at 1 mm or smaller. Therefore, the patient is at considerable risk distal left ICA occlusion. Atherosclerotic disease in both carotid siphon regions with stenosis estimated at 50%. No intracranial occlusion or correctable stenosis identified. Stenotic disease in both V4 segments, estimated at 70% on the right and 50% on the left. Electronically Signed   By: Nelson Chimes M.D.   On: 05/10/2018 13:20   Ct Head Wo Contrast  Result Date: 05/10/2018 CLINICAL DATA:  Left arm weakness for several hours EXAM: CT HEAD WITHOUT CONTRAST TECHNIQUE: Contiguous axial images were obtained from the base of the skull through the vertex without intravenous contrast. COMPARISON:  None. FINDINGS: Brain: No evidence of acute infarction, hemorrhage, hydrocephalus, extra-axial collection or mass lesion/mass effect. Vascular: No hyperdense vessel or unexpected calcification. Skull: Normal. Negative for fracture or focal lesion. Sinuses/Orbits: No acute finding. Other: None. IMPRESSION: No acute intracranial abnormality noted. Electronically Signed   By: Inez Catalina M.D.   On: 05/10/2018 11:27   Ct Angio Neck W Or Wo Contrast  Result Date: 05/10/2018 CLINICAL DATA:  Left arm weakness beginning 0400 hours EXAM: CT ANGIOGRAPHY HEAD AND NECK TECHNIQUE: Multidetector CT imaging of the head and neck was performed using the standard protocol during bolus administration of intravenous contrast. Multiplanar CT image reconstructions and MIPs  were obtained to evaluate the vascular anatomy. Carotid stenosis measurements (when applicable) are obtained utilizing NASCET criteria, using the distal internal carotid diameter as the denominator. CONTRAST:  79mL OMNIPAQUE IOHEXOL 350 MG/ML SOLN COMPARISON:  Head CT same day. FINDINGS: CTA NECK FINDINGS Aortic arch: Aortic atherosclerosis without aneurysm or dissection. Branching pattern of the brachiocephalic vessels is normal without origin stenosis. Right carotid system: Common carotid artery is tortuous but widely patent to the bifurcation region. There is soft and calcified plaque at the carotid bifurcation. Minimal diameter of the proximal ICA is 4 mm. Compared to a more distal cervical ICA diameter of 5 mm, this indicates a 20% stenosis. The distal cervical internal carotid artery beginning at about the C2-3 level is markedly abnormal. There appears to be a partially  calcified pseudoaneurysm followed by irregularity of the upper cervical internal carotid artery to the skull base level, consistent with atherosclerotic change or healed dissection. Narrowing in this segment is estimated at 30-50%. Left carotid system: Common carotid artery is tortuous but sufficiently patent to the bifurcation. At the carotid bifurcation, there is atherosclerotic disease with soft and calcified plaque. Minimal diameter of the proximal ICA is 3.5 mm. Compared to a more distal cervical ICA diameter of 5 mm, this indicates a 20% stenosis. Similarly on this side, the upper cervical internal carotid artery is abnormal with calcification and irregularity. This could be due to chronic dissection or simple atherosclerosis. Minimal diameter of the vessel just proximal to the skull base is 1 mm or less. This places the patient at risk left ICA complete occlusion. Vertebral arteries: Both vertebral artery origins show some atherosclerotic change but are sufficiently patent. Both vertebral arteries are patent through the cervical region  to the foramen magnum. Skeleton: Mild cervical spondylosis. Other neck: No soft tissue mass or lymphadenopathy. Right submandibular gland is either atrophic or surgically absent. Upper chest: Lung apices are clear. Review of the MIP images confirms the above findings CTA HEAD FINDINGS Anterior circulation: Both internal carotid arteries show flow through the carotid siphon region. There is extensive siphon calcification. Stenosis in that region is estimated at 50% on each side. Supraclinoid internal carotid arteries are widely patent. The anterior and middle cerebral vessels show flow without proximal stenosis, aneurysm or vascular malformation. No missing larger vessels are identified. Posterior circulation: Both vertebral arteries are patent at foramen magnum. There is a 70% stenosis of the right V4 segment, but the vessel does show flow distal to that to the basilar. There is a 50% stenosis of the left V4 segment, but the vessel is also patent to the basilar. No basilar stenosis. Posterior circulation branch vessels show flow. Venous sinuses: Patent and normal. Anatomic variants: None significant. Delayed phase: No abnormal enhancement. 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 20% stenosis in the ICA bulb region on each side. Abnormal distal cervical internal carotid arteries on each side. 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 There is narrowing and irregularity of the upper cervical internal carotid artery, narrowing in that region estimated at 30-50%. On the left, I do not see a pseudo aneurysm, but there is more severe narrowing and irregularity of the upper cervical ICA, with the lumen at 1 mm or smaller. Therefore, the patient is at considerable risk distal left ICA occlusion. Atherosclerotic disease in both carotid siphon regions with stenosis estimated at 50%. No  intracranial occlusion or correctable stenosis identified. Stenotic disease in both V4 segments, estimated at 70% on the right and 50% on the left. Electronically Signed   By: Nelson Chimes M.D.   On: 05/10/2018 13:20   Ct Cerebral Perfusion W Contrast  Result Date: 05/10/2018 CLINICAL DATA:  Left arm weakness. EXAM: CT PERFUSION BRAIN TECHNIQUE: Multiphase CT imaging of the brain was performed following IV bolus contrast injection. Subsequent parametric perfusion maps were calculated using RAPID software. CONTRAST:  90mL OMNIPAQUE IOHEXOL 350 MG/ML SOLN COMPARISON:  Head CT and CTA from earlier today. FINDINGS: CT Brain Perfusion Findings: CBF (<30%) Volume: 66mL Perfusion (Tmax>6.0s) volume: 63mL ASPECTS on noncontrast CT Head: 10 at 10:55 a.m. today . When perfusion thresholds are liberalized to 4 seconds, there is a nearly 21 cc area of apparent ischemia  in the right frontal parietal region. IMPRESSION: 1. No infarct or penumbra by standard CT perfusion values. There is suggestion of slightly delayed perfusion in the right frontal parietal region of note given left arm symptoms. No resolved underlying vascular lesion by preceding CTA. 2. Motion degraded. Electronically Signed   By: Monte Fantasia M.D.   On: 05/10/2018 14:29    Assessment: 82 y.o. male presenting with LUE weakness.  Head CT reviewed and shows no acute changes.  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   Left ICA with severe stenosis.   Patient on ASA prior to admission.  Acute infarct suspected.  Further work up recommended.  Patient currently asymptomatic from critical left ICA stenosis.    Stroke Risk Factors - diabetes mellitus and hypertension  Plan: 1. HgbA1c, fasting lipid panel 2. MRI of the brain without contrast 3. PT consult, OT consult, Speech consult 4. Echocardiogram 5. Vascular consult 6. Prophylactic therapy-Continue ASA at 81mg  and start Plavix at 75mg  daily 7. NPO until RN  stroke swallow screen 8. Telemetry monitoring 9. Frequent neuro checks   Alexis Goodell, MD Neurology [phone] 05/10/2018, 4:43 PM

## 2018-05-10 NOTE — Progress Notes (Signed)
Purpose of Encounter Discussed regarding acute stroke, CODE STATUS discussion  Parties in Attendance Patient and his healthcare power of attorney his wife  Patients Decisional capacity Patient is alert and oriented x3.  Able to make medical decisions  Discussed with patient regarding acute stroke, treatment plan and prognosis  Patient has his wife documented as healthcare power of attorney.  They have made a decision to pursue CPR and intubation if needed but no prolonged resuscitative measures.  Full code  Time spent - 17 minutes

## 2018-05-10 NOTE — Progress Notes (Signed)
Spoke to Dr. Jerelyn Charles about conflicting Neuro/VS orders. See new orders.  Kyla Balzarine, LPN

## 2018-05-10 NOTE — H&P (Signed)
[facility] NAME: Nathaniel Gamble    MR#:  [id number]  DATE OF BIRTH:  01-May-1936  DATE OF ADMISSION:  05/10/2018  PRIMARY CARE PHYSICIAN: Madelyn Brunner, MD   REQUESTING/REFERRING PHYSICIAN: Dr. Quentin Cornwall  CHIEF COMPLAINT:   Chief Complaint  Patient presents with  . Weakness    HISTORY OF PRESENT ILLNESS:  Nathaniel Gamble  is a 82 y.o. male with a known history of hypertension, diabetes, CAD status post CABG, hyperlipidemia, CKD stage III presents to the emergency room due to weakness of the left upper extremity.  Patient noticed this when he woke up from sleep in the middle of the night at 4 AM.  Last well-known time was 10 PM.  Patient was outside [facility] window.  Seen by neurology in the emergency room.  Patient had CT scan of the head which was negative.  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 Patient continues to have weakness of left upper extremity.  No other focal signs.  No dysphagia or change in vision.  No change in speech.  No history of strokes.  Patient is being admitted for acute CVA.  PAST MEDICAL HISTORY:   Past Medical History:  Diagnosis Date  . Abdominal aortic aneurysm ([place])    monitored by dr. Ubaldo Glassing  . Abdominal aortic aneurysm ([place])   . Cancer ([place]) 1976   melanoma, under right arm, removed  . Chronic kidney disease 2011   stent right kidney  . Coronary artery disease   . Diabetes mellitus without complication ([place])   . Hypertension   . Jaw fracture ([place])   . Myocardial infarction ([place])   . Peripheral vascular disease ([place])   . PONV (postoperative nausea and vomiting)   . Renal artery stenosis ([place])     PAST SURGICAL HISTORY:   Past Surgical History:  Procedure Laterality Date  . CATARACT EXTRACTION Bilateral 2014  . CORONARY ARTERY BYPASS GRAFT  2010  . INGUINAL HERNIA REPAIR Left 07/09/2015    Procedure: HERNIA REPAIR INGUINAL ADULT;  Surgeon: Leonie Green, MD;  Location: [facility] ORS;  Service: General;  Laterality: Left;  [name] MANDIBLE FRACTURE SURGERY Left   . RENAL ARTERY STENT Right     SOCIAL HISTORY:   Social History   Tobacco Use  . Smoking status: Former Research scientist (life sciences)  . Smokeless tobacco: Former Network engineer Use Topics  . Alcohol use: Yes    Alcohol/week: 0.6 oz    Types: 1 Glasses of wine per week    FAMILY HISTORY:  No family history on file.  DRUG ALLERGIES:   Allergies  Allergen Reactions  . Lisinopril Swelling  . Tetanus Toxoids Swelling    REVIEW OF SYSTEMS:   Review of Systems  Constitutional: Positive for malaise/fatigue. Negative for chills, fever and weight loss.  HENT: Negative for hearing loss and nosebleeds.   Eyes: Negative for blurred vision, double vision and pain.  Respiratory: Negative for cough, hemoptysis, sputum production, shortness of breath and wheezing.   Cardiovascular: Negative for chest pain, palpitations, orthopnea and leg swelling.  Gastrointestinal: Negative for abdominal pain, constipation, diarrhea, nausea and vomiting.  Genitourinary: Negative for dysuria and hematuria.  Musculoskeletal: Negative for back pain, falls and myalgias.  Skin: Negative for rash.  Neurological: Positive for focal weakness. Negative for dizziness, tremors, sensory change, speech change, seizures and headaches.  Endo/Heme/Allergies:  Does not bruise/bleed easily.  Psychiatric/Behavioral: Negative for depression and memory loss. The patient is not nervous/anxious.     MEDICATIONS AT HOME:   Prior to Admission medications   Medication Sig Start Date End Date Taking? Authorizing Provider  allopurinol (ZYLOPRIM) 100 MG tablet Take 100 mg by mouth daily.   Yes [provider]  amLODipine (NORVASC) 10 MG tablet Take 10 mg by mouth daily.   Yes [provider]  aspirin EC 81 MG tablet Take 81 mg by mouth daily.   Yes [provider]  azelastine (ASTELIN) 0.1 % nasal spray Place into both nostrils every 12 (twelve) hours as needed for rhinitis. Use in each nostril as directed   Yes [provider]  beta carotene w/minerals (OCUVITE) tablet Take 1 tablet by mouth daily.   Yes [provider]  carvedilol (COREG) 25 MG tablet Take 25 mg by mouth 2 (two) times daily with a meal.   Yes [provider]  losartan (COZAAR) 50 MG tablet Take 50 mg by mouth daily.   Yes [provider]  simvastatin (ZOCOR) 40 MG tablet Take 40 mg by mouth at bedtime.   Yes [provider]     VITAL SIGNS:  Blood pressure (!) 157/83, pulse 63, temperature 98.1 F (36.7 C), resp. rate 17, height 5\' 10"  (1.778 m), weight 86.2 kg (190 lb), SpO2 98 %.  PHYSICAL EXAMINATION:  Physical Exam  GENERAL:  82 y.o.-year-old patient lying in the bed with no acute distress.  EYES: Pupils equal, round, reactive to light and accommodation. No scleral icterus. Extraocular muscles intact.  HEENT: Head atraumatic, normocephalic. Oropharynx and nasopharynx clear. No oropharyngeal erythema, moist oral mucosa  NECK:  Supple, no jugular venous distention. No thyroid enlargement, no tenderness.  LUNGS: Normal breath sounds bilaterally, no wheezing, rales, rhonchi. No use of accessory muscles of respiration.  CARDIOVASCULAR: S1, S2 normal. No murmurs, rubs, or gallops.  ABDOMEN: Soft, nontender, nondistended. Bowel sounds present. No organomegaly or mass.  EXTREMITIES: No pedal edema, cyanosis, or clubbing. + 2 pedal & radial pulses b/l.   NEUROLOGIC: Cranial nerves II through XII are intact.  Left upper extremity 3/5.  Other extremities strength 5/5. PSYCHIATRIC: The patient is alert and oriented x 3. Good affect.  SKIN: No obvious rash, lesion, or ulcer.   LABORATORY PANEL:   CBC Recent Labs  Lab 05/10/18 1042  WBC 7.1  HGB 14.8  HCT 43.7  PLT 138*    ------------------------------------------------------------------------------------------------------------------  Chemistries  Recent Labs  Lab 05/10/18 1042  NA 137  K 4.3  CL 107  CO2 21*  GLUCOSE 108*  BUN 32*  CREATININE 1.30*  CALCIUM 9.1  AST 25  ALT 19  ALKPHOS 74  BILITOT 0.8   ------------------------------------------------------------------------------------------------------------------  Cardiac Enzymes Recent Labs  Lab 05/10/18 1042  TROPONINI <0.03   ------------------------------------------------------------------------------------------------------------------  RADIOLOGY:  Ct Angio Head W Or Wo Contrast  Result Date: 05/10/2018 CLINICAL DATA:  Left arm weakness beginning 0400 hours EXAM: CT ANGIOGRAPHY HEAD AND NECK TECHNIQUE: Multidetector CT imaging of the head and neck was performed using the standard protocol during bolus administration of intravenous contrast. Multiplanar CT image reconstructions and MIPs were obtained to evaluate the vascular anatomy. Carotid stenosis measurements (when applicable) are obtained utilizing NASCET criteria, using the distal internal carotid diameter as the denominator. CONTRAST:  40mL OMNIPAQUE IOHEXOL 350 MG/ML SOLN COMPARISON:  Head CT same day. FINDINGS: CTA NECK FINDINGS Aortic arch: Aortic atherosclerosis without aneurysm or dissection. Branching pattern of  the brachiocephalic vessels is normal without origin stenosis. Right carotid system: Common carotid artery is tortuous but widely patent to the bifurcation region. There is soft and calcified plaque at the carotid bifurcation. Minimal diameter of the proximal ICA is 4 mm. Compared to a more distal cervical ICA diameter of 5 mm, this indicates a 20% stenosis. The distal cervical internal carotid artery beginning at about the C2-3 level is markedly abnormal. There appears to be a partially calcified pseudoaneurysm followed by irregularity of the upper cervical internal  carotid artery to the skull base level, consistent with atherosclerotic change or healed dissection. Narrowing in this segment is estimated at 30-50%. Left carotid system: Common carotid artery is tortuous but sufficiently patent to the bifurcation. At the carotid bifurcation, there is atherosclerotic disease with soft and calcified plaque. Minimal diameter of the proximal ICA is 3.5 mm. Compared to a more distal cervical ICA diameter of 5 mm, this indicates a 20% stenosis. Similarly on this side, the upper cervical internal carotid artery is abnormal with calcification and irregularity. This could be due to chronic dissection or simple atherosclerosis. Minimal diameter of the vessel just proximal to the skull base is 1 mm or less. This places the patient at risk left ICA complete occlusion. Vertebral arteries: Both vertebral artery origins show some atherosclerotic change but are sufficiently patent. Both vertebral arteries are patent through the cervical region to the foramen magnum. Skeleton: Mild cervical spondylosis. Other neck: No soft tissue mass or lymphadenopathy. Right submandibular gland is either atrophic or surgically absent. Upper chest: Lung apices are clear. Review of the MIP images confirms the above findings CTA HEAD FINDINGS Anterior circulation: Both internal carotid arteries show flow through the carotid siphon region. There is extensive siphon calcification. Stenosis in that region is estimated at 50% on each side. Supraclinoid internal carotid arteries are widely patent. The anterior and middle cerebral vessels show flow without proximal stenosis, aneurysm or vascular malformation. No missing larger vessels are identified. Posterior circulation: Both vertebral arteries are patent at foramen magnum. There is a 70% stenosis of the right V4 segment, but the vessel does show flow distal to that to the basilar. There is a 50% stenosis of the left V4 segment, but the vessel is also patent to the  basilar. No basilar stenosis. Posterior circulation branch vessels show flow. Venous sinuses: Patent and normal. Anatomic variants: None significant. Delayed phase: No abnormal enhancement. 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 20% stenosis in the ICA bulb region on each side. Abnormal distal cervical internal carotid arteries on each side. 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 There is narrowing and irregularity of the upper cervical internal carotid artery, narrowing in that region estimated at 30-50%. On the left, I do not see a pseudo aneurysm, but there is more severe narrowing and irregularity of the upper cervical ICA, with the lumen at 1 mm or smaller. Therefore, the patient is at considerable risk distal left ICA occlusion. Atherosclerotic disease in both carotid siphon regions with stenosis estimated at 50%. No intracranial occlusion or correctable stenosis identified. Stenotic disease in both V4 segments, estimated at 70% on the right and 50% on the left. Electronically Signed   By: Nelson Chimes M.D.   On: 05/10/2018 13:20   Ct Head Wo Contrast  Result Date: 05/10/2018 CLINICAL DATA:  Left arm weakness for several hours EXAM: CT HEAD WITHOUT CONTRAST  TECHNIQUE: Contiguous axial images were obtained from the base of the skull through the vertex without intravenous contrast. COMPARISON:  None. FINDINGS: Brain: No evidence of acute infarction, hemorrhage, hydrocephalus, extra-axial collection or mass lesion/mass effect. Vascular: No hyperdense vessel or unexpected calcification. Skull: Normal. Negative for fracture or focal lesion. Sinuses/Orbits: No acute finding. Other: None. IMPRESSION: No acute intracranial abnormality noted. Electronically Signed   By: Inez Catalina M.D.   On: 05/10/2018 11:27   Ct Angio Neck W Or Wo Contrast  Result Date:  05/10/2018 CLINICAL DATA:  Left arm weakness beginning 0400 hours EXAM: CT ANGIOGRAPHY HEAD AND NECK TECHNIQUE: Multidetector CT imaging of the head and neck was performed using the standard protocol during bolus administration of intravenous contrast. Multiplanar CT image reconstructions and MIPs were obtained to evaluate the vascular anatomy. Carotid stenosis measurements (when applicable) are obtained utilizing NASCET criteria, using the distal internal carotid diameter as the denominator. CONTRAST:  42mL OMNIPAQUE IOHEXOL 350 MG/ML SOLN COMPARISON:  Head CT same day. FINDINGS: CTA NECK FINDINGS Aortic arch: Aortic atherosclerosis without aneurysm or dissection. Branching pattern of the brachiocephalic vessels is normal without origin stenosis. Right carotid system: Common carotid artery is tortuous but widely patent to the bifurcation region. There is soft and calcified plaque at the carotid bifurcation. Minimal diameter of the proximal ICA is 4 mm. Compared to a more distal cervical ICA diameter of 5 mm, this indicates a 20% stenosis. The distal cervical internal carotid artery beginning at about the C2-3 level is markedly abnormal. 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 Narrowing in this segment is estimated at 30-50%. Left carotid system: Common carotid artery is tortuous but sufficiently patent to the bifurcation. At the carotid bifurcation, there is atherosclerotic disease with soft and calcified plaque. Minimal diameter of the proximal ICA is 3.5 mm. Compared to a more distal cervical ICA diameter of 5 mm, this indicates a 20% stenosis. Similarly on this side, the upper cervical internal carotid artery is abnormal with calcification and irregularity. This could be due to chronic dissection or simple atherosclerosis. Minimal diameter  of the vessel just proximal to the skull base is 1 mm or less. This places the patient at risk left ICA complete occlusion. Vertebral arteries: Both vertebral artery origins show some atherosclerotic change but are sufficiently patent. Both vertebral arteries are patent through the cervical region to the foramen magnum. Skeleton: Mild cervical spondylosis. Other neck: No soft tissue mass or lymphadenopathy. Right submandibular gland is either atrophic or surgically absent. Upper chest: Lung apices are clear. Review of the MIP images confirms the above findings CTA HEAD FINDINGS Anterior circulation: Both internal carotid arteries show flow through the carotid siphon region. There is extensive siphon calcification. Stenosis in that region is estimated at 50% on each side. Supraclinoid internal carotid arteries are widely patent. The anterior and middle cerebral vessels show flow without proximal stenosis, aneurysm or vascular malformation. No missing larger vessels are identified. Posterior circulation: Both vertebral arteries are patent at foramen magnum. There is a 70% stenosis of the right V4 segment, but the vessel does show flow distal to that to the basilar. There is a 50% stenosis of the left V4 segment, but the vessel is also patent to the basilar. No basilar stenosis. Posterior circulation branch vessels show flow. Venous sinuses: Patent and normal. Anatomic variants: None significant. Delayed phase: No abnormal  enhancement. 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 20% stenosis in the ICA bulb region on each side. Abnormal distal cervical internal carotid arteries on each side. 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 There is narrowing and irregularity of the upper cervical internal carotid artery, narrowing in that region estimated at 30-50%. On the left, I do not see a  pseudo aneurysm, but there is more severe narrowing and irregularity of the upper cervical ICA, with the lumen at 1 mm or smaller. Therefore, the patient is at considerable risk distal left ICA occlusion. Atherosclerotic disease in both carotid siphon regions with stenosis estimated at 50%. No intracranial occlusion or correctable stenosis identified. Stenotic disease in both V4 segments, estimated at 70% on the right and 50% on the left. Electronically Signed   By: Nelson Chimes M.D.   On: 05/10/2018 13:20   Ct Cerebral Perfusion W Contrast  Result Date: 05/10/2018 CLINICAL DATA:  Left arm weakness. EXAM: CT PERFUSION BRAIN TECHNIQUE: Multiphase CT imaging of the brain was performed following IV bolus contrast injection. Subsequent parametric perfusion maps were calculated using RAPID software. CONTRAST:  45mL OMNIPAQUE IOHEXOL 350 MG/ML SOLN COMPARISON:  Head CT and CTA from earlier today. FINDINGS: CT Brain Perfusion Findings: CBF (<30%) Volume: 34mL Perfusion (Tmax>6.0s) volume: 55mL ASPECTS on noncontrast CT Head: 10 at 10:55 a.m. today . When perfusion thresholds are liberalized to 4 seconds, there is a nearly 21 cc area of apparent ischemia in the right frontal parietal region. IMPRESSION: 1. No infarct or penumbra by standard CT perfusion values. There is suggestion of slightly delayed perfusion in the right frontal parietal region of note given left arm symptoms. No resolved underlying vascular lesion by preceding CTA. 2. Motion degraded. Electronically Signed   By: Monte Fantasia M.D.   On: 05/10/2018 14:29     IMPRESSION AND PLAN:   * Acute CVA -Check MRI of the brain, Echo - Start aspirin and statin. - Lovenox for DVT prophylaxis. - PT/OT/Speech consult as needed per symptoms - Neuro checks every 4 hours for 24 hours. - Consult neurology.  * HTN Continue home medications  *Diabetes mellitus.  Sliding scale insulin.  All the records are reviewed and case discussed with ED  provider. Management plans discussed with the patient, family and they are in agreement.  CODE STATUS: FULL CODE  TOTAL TIME TAKING CARE OF THIS PATIENT: 40 minutes.   Leia Alf Cameshia Cressman M.D on 05/10/2018 at 3:02 PM  Between 7am to 6pm - Pager - [phone]  After 6pm go to [email or web address] - password [email or web address] [facility]  Office  [phone]  CC: Primary care physician; Madelyn Brunner, MD  Note: This dictation was prepared with Dragon dictation along with smaller phrase technology. Any transcriptional errors that result from this process are unintentional.

## 2018-05-11 ENCOUNTER — Inpatient Hospital Stay
Admit: 2018-05-11 | Discharge: 2018-05-11 | Disposition: A | Discharge status: Home / Self Care | Payer: BLUE CROSS/BLUE SHIELD | Attending: Internal Medicine | Admitting: Internal Medicine

## 2018-05-11 DIAGNOSIS — M6281 Muscle weakness (generalized): Secondary | ICD-10-CM

## 2018-05-11 DIAGNOSIS — E119 Type 2 diabetes mellitus without complications: Secondary | ICD-10-CM

## 2018-05-11 DIAGNOSIS — I6521 Occlusion and stenosis of right carotid artery: Secondary | ICD-10-CM

## 2018-05-11 DIAGNOSIS — I10 Essential (primary) hypertension: Secondary | ICD-10-CM

## 2018-05-11 LAB — HEMOGLOBIN A1C
Hgb A1c MFr Bld: 5.5 % (ref 4.8–5.6)
Mean Plasma Glucose: 111.15 mg/dL

## 2018-05-11 LAB — LIPID PANEL
CHOL/HDL RATIO: 3.5 ratio
CHOLESTEROL: 160 mg/dL (ref 0–200)
HDL: 46 mg/dL (ref >40)
LDL Cholesterol: 76 mg/dL (ref 0–99)
Triglycerides: 192 mg/dL — ABNORMAL HIGH (ref <150)
VLDL: 38 mg/dL (ref 0–40)

## 2018-05-11 LAB — GLUCOSE, CAPILLARY
Glucose-Capillary: 102 mg/dL — ABNORMAL HIGH (ref 70–99)
Glucose-Capillary: 107 mg/dL — ABNORMAL HIGH (ref 70–99)

## 2018-05-11 MED ORDER — CLOPIDOGREL BISULFATE 75 MG PO TABS: 75.0000 mg | ORAL_TABLET | Freq: Every day | ORAL | 11 refills | Status: AC

## 2018-05-11 NOTE — Evaluation (Signed)
Occupational Therapy Evaluation Patient Details Name: Nathaniel Gamble MRN: [id number] DOB: 1936/08/06 Today's Date: 05/11/2018    History of Present Illness Nathaniel Gamble is an 82 y.o. male with a history of CAD, hypertension, diabetes, CAD status post CABG, hyperlipidemia, CKD stage III  who reports going to bed last evening and being at baseline.  Awakened at 0400 to use the bathroom and noted that his left arm was heavy and hard to use.  MRI indicated several punctate acute/early subacute infarctions in a watershed distribution in addition to chronic microvascular ischemic changes and parenchymal volume loss of brain. CT angio of neck indicated severe narrowing and irregularity of the upper cervical ICA,.   Clinical Impression   Pt is 81 year old male who presents with new onset CVA (see above for presenting problems).  Pt is eager to regain use of LUE and hand and was active prior to admission.  He is slightly impulsive but has awareness of L side but needs cues to protect LUE when doing self ROM so arm does not drop from flexion to his lap.  He required CGA with mod cues to transition from supine to sitting EOB and min assist when standing for balance.  Pt is able to move L UE and hand but has decreased coordination and stamina for even hand to mouth.  Sensation is intact and see below for details of active movement in LUE and hand.  Rec continued OT while in hospital to continue to work on increasing independence in ADLs, balance and functional mobility training, coordination exercises and family ed and training and OT [facility] after discharge.    Follow Up Recommendations  Home health OT    Equipment Recommendations       Recommendations for Other Services       Precautions / Restrictions Precautions Precautions: Fall Restrictions Weight Bearing Restrictions: No      Mobility Bed Mobility                  Transfers                      Balance                                            ADL either performed or assessed with clinical judgement   ADL Overall ADL's : Needs assistance/impaired Eating/Feeding: Minimal assistance;Set up;Sitting Eating/Feeding Details (indicate cue type and reason): assist for cutting up meat and opening containers; needs training in how to use LUE and hand as a gross assist  Grooming: Wash/dry hands;Wash/dry face;Oral care;Set up;Minimal assistance;Sitting Grooming Details (indicate cue type and reason): cueing to use LUE and hand to help with R hand         Upper Body Dressing : Minimal assistance;Set up;Cueing for sequencing;Sitting Upper Body Dressing Details (indicate cue type and reason): min assist with mod cues for dressing L side first and  Lower Body Dressing: Minimal assistance;Set up;Supervision/safety;Sit to/from stand Lower Body Dressing Details (indicate cue type and reason): min assist for balance standing and for position of LUE and hand                     Vision Patient Visual Report: No change from baseline Additional Comments: Hx of macular degeneration---fair to poor convergence and upward gaze but appears to have full visual fields  Perception     Praxis      Pertinent Vitals/Pain Pain Assessment: No/denies pain     Hand Dominance Right   Extremity/Trunk Assessment Upper Extremity Assessment Upper Extremity Assessment: LUE deficits/detail LUE Deficits / Details: Pt has AROM of LUE to about 100 degrees with fair control, active elbow flexion and extension but also with poor control, no wrist flexion or extension, active flexion and extension of L hand with poor coordination but intact sensation.   LUE Sensation: WNL LUE Coordination: decreased gross motor;decreased fine motor   Lower Extremity Assessment Lower Extremity Assessment: Defer to PT evaluation       Communication Communication Communication: No difficulties   Cognition Arousal/Alertness:  Awake/alert Behavior During Therapy: WFL for tasks assessed/performed Overall Cognitive Status: Within Functional Limits for tasks assessed                                 General Comments: Cues to protect LUE and hand and educated him and his wife Nathaniel Gamble about positioning and protection of LUE and hand.   General Comments       Exercises     Shoulder Instructions      Home Living Family/patient expects to be discharged to:: Private residence Living Arrangements: Spouse/significant other;Children Available Help at Discharge: Family Type of Home: House Home Access: Stairs to enter Technical brewer of Steps: 3 Entrance Stairs-Rails: Right Home Layout: Multi-level;Able to live on main level with bedroom/bathroom Alternate Level Stairs-Number of Steps: 3 Alternate Level Stairs-Rails: Right Bathroom Shower/Tub: Occupational psychologist: Standard Bathroom Accessibility: Yes How Accessible: Accessible via walker Home Equipment: [place] - 2 wheels;Bedside commode;Shower seat - built in;Hand held shower head;Hospital bed          Prior Functioning/Environment Level of Independence: Independent        Comments: Pt reports he was independent in all ADLs and was active.        OT Problem List: Decreased strength;Decreased range of motion;Decreased activity tolerance;Decreased coordination;Impaired balance (sitting and/or standing);Impaired UE functional use      OT Treatment/Interventions: Self-care/ADL training;Balance training;Patient/family education;Neuromuscular education;Therapeutic activities;Therapeutic exercise    OT Goals(Current goals can be found in the care plan section) Acute Rehab OT Goals Patient Stated Goal: "to go home today if I can" OT Goal Formulation: With patient/family Time For Goal Achievement: 05/25/18 Potential to Achieve Goals: Good ADL Goals Pt Will Perform Lower Body Dressing: with min assist;sit to/from  stand;with set-up Pt Will Transfer to Toilet: with min assist;with supervision;stand pivot transfer;bedside commode;regular height toilet Pt/caregiver will Perform Home Exercise Program: Left upper extremity;With written HEP provided;With Supervision  OT Frequency: Min 2X/week   Barriers to D/C:            Co-evaluation              AM-PAC PT "6 Clicks" Daily Activity     Outcome Measure Help from another person eating meals?: A Little Help from another person taking care of personal grooming?: A Little Help from another person toileting, which includes using toliet, bedpan, or urinal?: A Little Help from another person bathing (including washing, rinsing, drying)?: A Little Help from another person to put on and taking off regular upper body clothing?: A Little Help from another person to put on and taking off regular lower body clothing?: A Little 6 Click Score: 18   End of Session    Activity Tolerance: Patient tolerated  treatment well Patient left: in bed;with family/visitor present;Other (comment)(PT present with pt sitting EOB)  OT Visit Diagnosis: Unsteadiness on feet (R26.81);Muscle weakness (generalized) (M62.81);Hemiplegia and hemiparesis Hemiplegia - Right/Left: Left Hemiplegia - dominant/non-dominant: Non-Dominant Hemiplegia - caused by: Cerebral infarction                Time: 3244-0102 OT Time Calculation (min): 44 min Charges:  OT General Charges $OT Visit: 1 Visit OT Evaluation $OT Eval Low Complexity: 1 Low OT Treatments $Self Care/Home Management : 8-22 mins $Neuromuscular Re-education: 8-22 mins G-Codes:     Chrys Racer, OTR/L ascom [phone] 05/11/18, 12:03 PM

## 2018-05-11 NOTE — Evaluation (Signed)
Physical Therapy Evaluation Patient Details Name: Nathaniel Gamble MRN: [id number] DOB: October 07, 1936 Today's Date: 05/11/2018   History of Present Illness  Nathaniel Gamble is an 82 y.o. male with a history of CAD, hypertension, diabetes, CAD status post CABG, hyperlipidemia, CKD stage III  who reports going to bed last evening and being at baseline.  Awakened at 0400 to use the bathroom and noted that his left arm was heavy and hard to use.  MRI indicated several punctate acute/early subacute infarctions in a watershed distribution in addition to chronic microvascular ischemic changes and parenchymal volume loss of brain. CT angio of neck indicated severe narrowing and irregularity of the upper cervical ICA,.    Clinical Impression  Pt is a pleasant 82 year old male who was admitted for a L paramedian midbrain infarct. Pt performs transfers and ambulation with CGA and no AD. Pt demonstrates deficits with strength, ROM, and mobility. Pt sensation WNL on B upper and LE. Pt  able to perform AROM of all LE motions including extension of toes. Pt demonstrates at least 4/5 strength in LEs that is equal bilaterally. Pt declines pain onset of therapy. Pt amb 250' with CGA and no AD with chair follow. Pt instructed verbally and through demonstration HEP. Pt tolerated treatment well. According to patient and patients family mobility status is consistent with pre admission. PT would benefit from continued skilled therapy to improve toward PLOF. PT will continue to work with pt 7x/week during admission. At this time PT recommends d/c home with outpatient PT to prevent future falls     Follow Up Recommendations Outpatient PT    Equipment Recommendations       Recommendations for Other Services       Precautions / Restrictions Precautions Precautions: Fall Restrictions Weight Bearing Restrictions: No      Mobility  Bed Mobility               General bed mobility comments: Not assessed at this  time sitting EOB at beginning of session prefers to remain up in chair at end of session  Transfers Overall transfer level: Needs assistance Equipment used: None Transfers: Sit to/from Stand Sit to Stand: Min guard         General transfer comment: Pt min guard sit<>stand however does not have any gross LOB or difficulty with transfers  Ambulation/Gait Ambulation/Gait assistance: Min guard Gait Distance (Feet): 250 Feet Assistive device: None Gait Pattern/deviations: Decreased step length - right;Decreased step length - left;Step-through pattern     General Gait Details: pt amb 250' with CGA and a chair follow. Pt and family decline difference from baseline with gait. Pt amb with narrow BOS and slightly flexed knees which he states is due to knee pain.  Stairs            Wheelchair Mobility    Modified Rankin (Stroke Patients Only)       Balance Overall balance assessment: Independent                                           Pertinent Vitals/Pain Pain Assessment: No/denies pain    Home Living Family/patient expects to be discharged to:: Private residence Living Arrangements: Spouse/significant other;Children Available Help at Discharge: Family Type of Home: House Home Access: Stairs to enter Entrance Stairs-Rails: Right Entrance Stairs-Number of Steps: 3 Home Layout: Multi-level;Able to live on main level  with bedroom/bathroom Home Equipment: [place] - 2 wheels;Bedside commode;Shower seat - built in;Hand held shower head;Hospital bed      Prior Function Level of Independence: Independent         Comments: Pt reports he was independent in all ADLs and was active including amb 2 miles per day. One recent fall slipping in mud while doing yard work.     Hand Dominance   Dominant Hand: Right    Extremity/Trunk Assessment   Upper Extremity Assessment Upper Extremity Assessment: RUE deficits/detail;LUE deficits/detail RUE Deficits /  Details: WNL(Grossly at least 4/5) RUE Sensation: WNL LUE Deficits / Details: AROM of L UE to about 100 degrees of shoulder flexion. Unable to control motions at the elbow or hand LUE Sensation: WNL LUE Coordination: decreased gross motor;decreased fine motor    Lower Extremity Assessment Lower Extremity Assessment: RLE deficits/detail;LLE deficits/detail RLE Deficits / Details: Grossly at least 4/5. 4+/5 knee flex/ext, DF, hip flexion RLE Sensation: WNL LLE Deficits / Details: Grossly at least 4/5. 4+/5 knee flex/ext, DF, hip flexion LLE Sensation: WNL       Communication   Communication: No difficulties  Cognition Arousal/Alertness: Awake/alert Behavior During Therapy: WFL for tasks assessed/performed Overall Cognitive Status: Within Functional Limits for tasks assessed                                 General Comments: Cues to protect LUE and hand and educated him and his wife Nathaniel Gamble about positioning and protection of LUE and hand.      General Comments      Exercises Other Exercises Other Exercises: Pt instructed in LE exercises including LAQ, standing heel raises, standing reaching outside BOS x 10 reps each   Assessment/Plan    PT Assessment Patient needs continued PT services  PT Problem List Decreased strength;Decreased range of motion;Decreased activity tolerance;Decreased balance;Decreased mobility;Decreased coordination;Decreased knowledge of use of DME       PT Treatment Interventions DME instruction;Gait training;Stair training;Functional mobility training;Therapeutic activities;Therapeutic exercise;Balance training    PT Goals (Current goals can be found in the Care Plan section)  Acute Rehab PT Goals Patient Stated Goal: "to go home today if I can" PT Goal Formulation: With patient Time For Goal Achievement: 05/25/18 Potential to Achieve Goals: Good    Frequency 7X/week   Barriers to discharge        Co-evaluation                AM-PAC PT "6 Clicks" Daily Activity  Outcome Measure Difficulty turning over in bed (including adjusting bedclothes, sheets and blankets)?: None Difficulty moving from lying on back to sitting on the side of the bed? : A Little Difficulty sitting down on and standing up from a chair with arms (e.g., wheelchair, bedside commode, etc,.)?: None Help needed moving to and from a bed to chair (including a wheelchair)?: None Help needed walking in hospital room?: None Help needed climbing 3-5 steps with a railing? : None 6 Click Score: 23    End of Session Equipment Utilized During Treatment: Gait belt Activity Tolerance: Patient tolerated treatment well;No increased pain(pt functioning at baseline according to pt and family) Patient left: in chair;with call bell/phone within reach;with chair alarm set;with nursing/sitter in room;with family/visitor present Nurse Communication: Mobility status PT Visit Diagnosis: Unsteadiness on feet (R26.81);Other abnormalities of gait and mobility (R26.89);Muscle weakness (generalized) (M62.81);History of falling (Z91.81);Difficulty in walking, not elsewhere classified (R26.2)  Time: 1093-2355 PT Time Calculation (min) (ACUTE ONLY): 25 min   Charges:         PT G Codes:        Aurie Harroun, SPT   Amery Minasyan 05/11/2018, 12:50 PM

## 2018-05-11 NOTE — Plan of Care (Signed)
  Problem: Education: Goal: Knowledge of disease or condition will improve Outcome: Adequate for Discharge   Problem: Education: Goal: Knowledge of General Education information will improve Outcome: Adequate for Discharge Patient waiting for vascular consult prior to discharge as per MD order, no active S/S and symptom noted at this time, family at bedside.

## 2018-05-11 NOTE — Progress Notes (Signed)
Subjective: No new neurological complaints  Objective: Current vital signs: BP (!) 166/69 (BP Location: Right Arm)   Pulse (!) 57   Temp (!) 97.5 F (36.4 C) (Oral)   Resp 18   Ht 5\' 10"  (1.778 m)   Wt 86.2 kg (190 lb)   SpO2 100%   BMI 27.26 kg/m  Vital signs in last 24 hours: Temp:  [97.5 F (36.4 C)-98.4 F (36.9 C)] 97.5 F (36.4 C) (06/28 0807) Pulse Rate:  [57-68] 57 (06/28 0807) Resp:  [17-18] 18 (06/28 0807) BP: (148-182)/(69-89) 166/69 (06/28 0807) SpO2:  [97 %-100 %] 100 % (06/28 0807)  Intake/Output from previous day: 06/27 0701 - 06/28 0700 In: 120 [P.O.:120] Out: 250 [Urine:250] Intake/Output this shift: Total I/O In: 50 [P.O.:50] Out: 300 [Urine:300] Nutritional status:  Diet Order           Diet - low sodium heart healthy        Diet Carb Modified Fluid consistency: Thin; Room service appropriate? Yes  Diet effective now          Neurologic Exam: Mental Status: Alert, oriented, thought content appropriate.  Speech fluent without evidence of aphasia.  Able to follow 3 step commands without difficulty. Cranial Nerves: II: Discs flat bilaterally; Visual fields grossly normal, pupils equal, round, reactive to light and accommodation III,IV, VI: ptosis not present, extra-ocular motions intact bilaterally V,VII: mild left facial droop, facial light touch sensation normal bilaterally VIII: hearing normal bilaterally IX,X: gag reflex present XI: bilateral shoulder shrug XII: midline tongue extension Motor: Right :  Upper extremity   5/5                                      Left:     Upper extremity   2-3/5             Lower extremity   5/5                                                  Lower extremity   5/5 Tone and bulk:normal tone throughout; no atrophy noted Sensory: Pinprick and light touch intact throughout, bilaterally  Lab Results: Basic Metabolic Panel: Recent Labs  Lab 05/10/18 1042  NA 137  K 4.3  CL 107  CO2 21*  GLUCOSE 108*   BUN 32*  CREATININE 1.30*  CALCIUM 9.1    Liver Function Tests: Recent Labs  Lab 05/10/18 1042  AST 25  ALT 19  ALKPHOS 74  BILITOT 0.8  PROT 7.3  ALBUMIN 4.1   No results for input(s): LIPASE, AMYLASE in the last 168 hours. No results for input(s): AMMONIA in the last 168 hours.  CBC: Recent Labs  Lab 05/10/18 1042  WBC 7.1  NEUTROABS 4.1  HGB 14.8  HCT 43.7  MCV 91.1  PLT 138*    Cardiac Enzymes: Recent Labs  Lab 05/10/18 1042  TROPONINI <0.03    Lipid Panel: Recent Labs  Lab 05/11/18 0549  CHOL 160  TRIG 192*  HDL 46  CHOLHDL 3.5  VLDL 38  LDLCALC 76    CBG: Recent Labs  Lab 05/10/18 1653 05/10/18 2130 05/11/18 0741 05/11/18 1141  GLUCAP 98 99 102* 107*    Microbiology: No results found for this or any previous  visit.  Coagulation Studies: Recent Labs    05/10/18 1042  LABPROT 13.6  INR 1.05    Imaging: Ct Angio Head W Or Wo Contrast  Result Date: 05/10/2018 CLINICAL DATA:  Left arm weakness beginning 0400 hours EXAM: CT ANGIOGRAPHY HEAD AND NECK TECHNIQUE: Multidetector CT imaging of the head and neck was performed using the standard protocol during bolus administration of intravenous contrast. Multiplanar CT image reconstructions and MIPs were obtained to evaluate the vascular anatomy. Carotid stenosis measurements (when applicable) are obtained utilizing NASCET criteria, using the distal internal carotid diameter as the denominator. CONTRAST:  53mL OMNIPAQUE IOHEXOL 350 MG/ML SOLN COMPARISON:  Head CT same day. FINDINGS: CTA NECK FINDINGS Aortic arch: Aortic atherosclerosis without aneurysm or dissection. Branching pattern of the brachiocephalic vessels is normal without origin stenosis. Right carotid system: Common carotid artery is tortuous but widely patent to the bifurcation region. There is soft and calcified plaque at the carotid bifurcation. Minimal diameter of the proximal ICA is 4 mm. Compared to a more distal cervical ICA  diameter of 5 mm, this indicates a 20% stenosis. The distal cervical internal carotid artery beginning at about the C2-3 level is markedly abnormal. 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 Narrowing in this segment is estimated at 30-50%. Left carotid system: Common carotid artery is tortuous but sufficiently patent to the bifurcation. At the carotid bifurcation, there is atherosclerotic disease with soft and calcified plaque. Minimal diameter of the proximal ICA is 3.5 mm. Compared to a more distal cervical ICA diameter of 5 mm, this indicates a 20% stenosis. Similarly on this side, the upper cervical internal carotid artery is abnormal with calcification and irregularity. This could be due to chronic dissection or simple atherosclerosis. Minimal diameter of the vessel just proximal to the skull base is 1 mm or less. This places the patient at risk left ICA complete occlusion. Vertebral arteries: Both vertebral artery origins show some atherosclerotic change but are sufficiently patent. Both vertebral arteries are patent through the cervical region to the foramen magnum. Skeleton: Mild cervical spondylosis. Other neck: No soft tissue mass or lymphadenopathy. Right submandibular gland is either atrophic or surgically absent. Upper chest: Lung apices are clear. Review of the MIP images confirms the above findings CTA HEAD FINDINGS Anterior circulation: Both internal carotid arteries show flow through the carotid siphon region. There is extensive siphon calcification. Stenosis in that region is estimated at 50% on each side. Supraclinoid internal carotid arteries are widely patent. The anterior and middle cerebral vessels show flow without proximal stenosis, aneurysm or vascular malformation. No missing larger vessels are identified. Posterior  circulation: Both vertebral arteries are patent at foramen magnum. There is a 70% stenosis of the right V4 segment, but the vessel does show flow distal to that to the basilar. There is a 50% stenosis of the left V4 segment, but the vessel is also patent to the basilar. No basilar stenosis. Posterior circulation branch vessels show flow. Venous sinuses: Patent and normal. Anatomic variants: None significant. Delayed phase: No abnormal enhancement. 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 20% stenosis in the ICA bulb region on each side. Abnormal distal cervical internal carotid arteries on each side. 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 There is narrowing and irregularity of  the upper cervical internal carotid artery, narrowing in that region estimated at 30-50%. On the left, I do not see a pseudo aneurysm, but there is more severe narrowing and irregularity of the upper cervical ICA, with the lumen at 1 mm or smaller. Therefore, the patient is at considerable risk distal left ICA occlusion. Atherosclerotic disease in both carotid siphon regions with stenosis estimated at 50%. No intracranial occlusion or correctable stenosis identified. Stenotic disease in both V4 segments, estimated at 70% on the right and 50% on the left. Electronically Signed   By: Nelson Chimes M.D.   On: 05/10/2018 13:20   Ct Head Wo Contrast  Result Date: 05/10/2018 CLINICAL DATA:  Left arm weakness for several hours EXAM: CT HEAD WITHOUT CONTRAST TECHNIQUE: Contiguous axial images were obtained from the base of the skull through the vertex without intravenous contrast. COMPARISON:  None. FINDINGS: Brain: No evidence of acute infarction, hemorrhage, hydrocephalus, extra-axial collection or mass lesion/mass effect. Vascular: No hyperdense vessel or unexpected calcification. Skull: Normal. Negative for  fracture or focal lesion. Sinuses/Orbits: No acute finding. Other: None. IMPRESSION: No acute intracranial abnormality noted. Electronically Signed   By: Inez Catalina M.D.   On: 05/10/2018 11:27   Ct Angio Neck W Or Wo Contrast  Result Date: 05/10/2018 CLINICAL DATA:  Left arm weakness beginning 0400 hours EXAM: CT ANGIOGRAPHY HEAD AND NECK TECHNIQUE: Multidetector CT imaging of the head and neck was performed using the standard protocol during bolus administration of intravenous contrast. Multiplanar CT image reconstructions and MIPs were obtained to evaluate the vascular anatomy. Carotid stenosis measurements (when applicable) are obtained utilizing NASCET criteria, using the distal internal carotid diameter as the denominator. CONTRAST:  42mL OMNIPAQUE IOHEXOL 350 MG/ML SOLN COMPARISON:  Head CT same day. FINDINGS: CTA NECK FINDINGS Aortic arch: Aortic atherosclerosis without aneurysm or dissection. Branching pattern of the brachiocephalic vessels is normal without origin stenosis. Right carotid system: Common carotid artery is tortuous but widely patent to the bifurcation region. There is soft and calcified plaque at the carotid bifurcation. Minimal diameter of the proximal ICA is 4 mm. Compared to a more distal cervical ICA diameter of 5 mm, this indicates a 20% stenosis. The distal cervical internal carotid artery beginning at about the C2-3 level is markedly abnormal. 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 Narrowing in this segment is estimated at 30-50%. Left carotid system: Common carotid artery is tortuous but sufficiently patent to the bifurcation. At the carotid bifurcation, there is atherosclerotic disease with soft and calcified plaque. Minimal diameter of the proximal ICA is 3.5 mm. Compared to a more distal cervical ICA  diameter of 5 mm, this indicates a 20% stenosis. Similarly on this side, the upper cervical internal carotid artery is abnormal with calcification and irregularity. This could be due to chronic dissection or simple atherosclerosis. Minimal diameter of the vessel just proximal to the skull base is 1 mm or less. This places the patient at risk left ICA complete occlusion. Vertebral arteries: Both vertebral artery origins show some atherosclerotic change but are sufficiently patent. Both vertebral arteries are patent through the cervical region to the foramen magnum. Skeleton: Mild cervical spondylosis. Other neck: No soft tissue mass or lymphadenopathy. Right submandibular gland is either atrophic or surgically absent. Upper chest: Lung apices are clear. Review of the MIP images confirms the above findings CTA HEAD FINDINGS Anterior  circulation: Both internal carotid arteries show flow through the carotid siphon region. There is extensive siphon calcification. Stenosis in that region is estimated at 50% on each side. Supraclinoid internal carotid arteries are widely patent. The anterior and middle cerebral vessels show flow without proximal stenosis, aneurysm or vascular malformation. No missing larger vessels are identified. Posterior circulation: Both vertebral arteries are patent at foramen magnum. There is a 70% stenosis of the right V4 segment, but the vessel does show flow distal to that to the basilar. There is a 50% stenosis of the left V4 segment, but the vessel is also patent to the basilar. No basilar stenosis. Posterior circulation branch vessels show flow. Venous sinuses: Patent and normal. Anatomic variants: None significant. Delayed phase: No abnormal enhancement. 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 20% stenosis in the ICA bulb region on each side. Abnormal distal cervical internal carotid arteries on  each side. 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 There is narrowing and irregularity of the upper cervical internal carotid artery, narrowing in that region estimated at 30-50%. On the left, I do not see a pseudo aneurysm, but there is more severe narrowing and irregularity of the upper cervical ICA, with the lumen at 1 mm or smaller. Therefore, the patient is at considerable risk distal left ICA occlusion. Atherosclerotic disease in both carotid siphon regions with stenosis estimated at 50%. No intracranial occlusion or correctable stenosis identified. Stenotic disease in both V4 segments, estimated at 70% on the right and 50% on the left. Electronically Signed   By: Nelson Chimes M.D.   On: 05/10/2018 13:20   Mr Brain Wo Contrast  Result Date: 05/11/2018 CLINICAL DATA:  82 y/o  M; left upper extremity weakness. EXAM: MRI HEAD WITHOUT CONTRAST TECHNIQUE: Multiplanar, multiecho pulse sequences of the brain and surrounding structures were obtained without intravenous contrast. COMPARISON:  05/10/2018 CT head, CTA head, CT perfusion head. FINDINGS: Brain: Several punctate foci of reduced diffusion compatible with acute/early subacute infarction are present scattered throughout the right frontal parietal cortices, right caudate head, right temporal periventricular white matter, and the right temporal occipital junction. The distribution suggest watershed infarct. No associated hemorrhage or mass effect. Several nonspecific foci of T2 FLAIR hyperintense signal abnormality in subcortical and periventricular white matter are compatible with moderate chronic microvascular ischemic changes for age. Moderate brain parenchymal volume loss. Small chronic lacunar infarcts within the bilateral caudate heads. No focal mass effect, extra-axial collection, hydrocephalus, or herniation. Vascular: Normal flow voids. Skull and upper cervical spine: Normal marrow signal. Sinuses/Orbits:  Negative. Other: None. IMPRESSION: 1. Several punctate acute/early subacute infarctions in a right-sided probable watershed distribution. No associated hemorrhage or mass effect. 2. Moderate chronic microvascular ischemic changes and parenchymal volume loss of the brain. Electronically Signed   By: Kristine Garbe M.D.   On: 05/11/2018 00:08   Ct Cerebral Perfusion W Contrast  Result Date: 05/10/2018 CLINICAL DATA:  Left arm weakness. EXAM: CT PERFUSION BRAIN TECHNIQUE: Multiphase CT imaging of the brain was performed following IV bolus contrast injection. Subsequent parametric perfusion maps were calculated using RAPID software. CONTRAST:  48mL OMNIPAQUE IOHEXOL 350 MG/ML SOLN COMPARISON:  Head CT and CTA from earlier today. FINDINGS: CT Brain Perfusion Findings: CBF (<30%) Volume: 10mL Perfusion (Tmax>6.0s) volume: 79mL ASPECTS on noncontrast CT Head: 10 at 10:55 a.m. today . When perfusion thresholds are liberalized to 4 seconds, there is a nearly  21 cc area of apparent ischemia in the right frontal parietal region. IMPRESSION: 1. No infarct or penumbra by standard CT perfusion values. There is suggestion of slightly delayed perfusion in the right frontal parietal region of note given left arm symptoms. No resolved underlying vascular lesion by preceding CTA. 2. Motion degraded. Electronically Signed   By: Monte Fantasia M.D.   On: 05/10/2018 14:29    Medications:  I have reviewed the patient's current medications. Scheduled: . allopurinol  100 mg Oral Daily  . amLODipine  10 mg Oral Daily  . aspirin EC  81 mg Oral Daily  . carvedilol  25 mg Oral BID WC  . enoxaparin (LOVENOX) injection  40 mg Subcutaneous Q24H  . insulin aspart  0-5 Units Subcutaneous QHS  . insulin aspart  0-9 Units Subcutaneous TID WC  . losartan  50 mg Oral Daily  . simvastatin  40 mg Oral QHS  . sodium chloride flush  3 mL Intravenous Q12H    Assessment/Plan: Patient with continued left arm weakness.  MRI of  the brain reviewed and shows multiple, small, punctate acute infarcts on the right.  Embolic etiology suspected.  Echocardiogram shows no cardiac source of emboli.  A1c 5.5, LDL 7.6.  Vascular has evaluated.  Recommendations: 1. Continue ASA and Plavix 2. Aggressive lipid management with target LDL<70. 3. Follow up with neurology on an outpatient basis.    LOS: 1 day   Alexis Goodell, MD Neurology [phone] 05/11/2018  2:19 PM

## 2018-05-11 NOTE — Consult Note (Addendum)
Nathaniel Gamble  MRN : [id number]  Nathaniel Gamble is a 81 y.o. (16-Dec-1935) male who presents with chief complaint of  Chief Complaint  Patient presents with  . Weakness  .  History of Present Illness: I am asked by Dr. Benjie Karvonen to see the patient regarding stroke and carotid disease.  The patient is admitted with left arm weakness.  This is improved from his admission, but far from normal.  He still has difficulty lifting that arm and doing fine motor with the left arm.  No speech or swallowing symptoms.  No visual symptoms.  No real leg symptoms.  He was found to have an MRI positive for stroke in the right hemisphere.  He also has had a CT angiogram of the carotid arteries which I have independently reviewed.  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  The typical cervical carotid stenosis on the left is in the 20 to 30% range.  At the base of the skull on the left, there is a more high-grade lesion that would appear to be at least 70%.  There is also some intracranial disease bilaterally.  Current Facility-Administered Medications  Medication Dose Route Frequency Provider Last Rate Last Dose  . acetaminophen (TYLENOL) tablet 650 mg  650 mg Oral Q6H PRN Hillary Bow, MD   650 mg at 05/10/18 2035   Or  . acetaminophen (TYLENOL) suppository 650 mg  650 mg Rectal Q6H PRN Sudini, Alveta Heimlich, MD      . albuterol (PROVENTIL) (2.5 MG/3ML) 0.083% nebulizer solution 2.5 mg  2.5 mg Nebulization Q2H PRN Sudini, Srikar, MD      . allopurinol (ZYLOPRIM) tablet 100 mg  100 mg Oral Daily Hillary Bow, MD   100 mg at 05/11/18 0949  . amLODipine (NORVASC) tablet 10 mg  10 mg Oral Daily Hillary Bow, MD   10 mg at 05/11/18 0947  . aspirin EC tablet 81 mg  81 mg Oral Daily Hillary Bow, MD   81 mg at 05/11/18 0947  . carvedilol  (COREG) tablet 25 mg  25 mg Oral BID WC Hillary Bow, MD   25 mg at 05/11/18 0809  . enoxaparin (LOVENOX) injection 40 mg  40 mg Subcutaneous Q24H Hillary Bow, MD   40 mg at 05/10/18 2140  . insulin aspart (novoLOG) injection 0-5 Units  0-5 Units Subcutaneous QHS Sudini, Srikar, MD      . insulin aspart (novoLOG) injection 0-9 Units  0-9 Units Subcutaneous TID WC Sudini, Srikar, MD      . losartan (COZAAR) tablet 50 mg  50 mg Oral Daily Hillary Bow, MD   50 mg at 05/11/18 0947  . polyethylene glycol (MIRALAX / GLYCOLAX) packet 17 g  17 g Oral Daily PRN Sudini, Alveta Heimlich, MD      . simvastatin (ZOCOR) tablet 40 mg  40 mg Oral QHS Hillary Bow, MD   40 mg at 05/10/18 2139  . sodium chloride flush (NS) 0.9 % injection 3 mL  3 mL Intravenous Q12H Hillary Bow, MD   3 mL at 05/11/18 0947    Past Medical History:  Diagnosis Date  . Abdominal aortic aneurysm ([place])    monitored by dr. Ubaldo Glassing  . Abdominal aortic aneurysm ([place])   . Cancer ([place]) 1976   melanoma, under right arm, removed  . Chronic kidney disease 2011  stent right kidney  . Coronary artery disease   . Diabetes mellitus without complication ([place])   . Hypertension   . Jaw fracture ([place])   . Myocardial infarction ([place])   . Peripheral vascular disease ([place])   . PONV (postoperative nausea and vomiting)   . Renal artery stenosis [facility])     Past Surgical History:  Procedure Laterality Date  . CATARACT EXTRACTION Bilateral 2014  . CORONARY ARTERY BYPASS GRAFT  2010  . INGUINAL HERNIA REPAIR Left 07/09/2015   Procedure: HERNIA REPAIR INGUINAL ADULT;  Surgeon: Leonie Green, MD;  Location: [facility] ORS;  Service: General;  Laterality: Left;  [name] MANDIBLE FRACTURE SURGERY Left   . RENAL ARTERY STENT Right     Social History Social History   Tobacco Use  . Smoking status: Former Research scientist (life sciences)  . Smokeless tobacco: Former Network engineer Use Topics  . Alcohol use: Yes    Alcohol/week: 0.6 oz    Types: 1 Glasses of wine per week   . Drug use: No    Family History No bleeding disorders, clotting disorders, aneurysms, or autoimmune diseases  Allergies  Allergen Reactions  . Lisinopril Swelling  . Tetanus Toxoids Swelling     REVIEW OF SYSTEMS (Negative unless checked)  Constitutional: [] Weight loss  [] Fever  [] Chills Cardiac: [] Chest pain   [] Chest pressure   [x] Palpitations   [] Shortness of breath when laying flat   [] Shortness of breath at rest   [x] Shortness of breath with exertion. Vascular:  [] Pain in legs with walking   [] Pain in legs at rest   [] Pain in legs when laying flat   [] Claudication   [] Pain in feet when walking  [] Pain in feet at rest  [] Pain in feet when laying flat   [] History of DVT   [] Phlebitis   [] Swelling in legs   [] Varicose veins   [] Non-healing ulcers Pulmonary:   [] Uses home oxygen   [] Productive cough   [] Hemoptysis   [] Wheeze  [] COPD   [] Asthma Neurologic:  [] Dizziness  [] Blackouts   [] Seizures   [x] History of stroke   [x] History of TIA  [] Aphasia   [] Temporary blindness   [] Dysphagia   [] Weakness or numbness in arms   [] Weakness or numbness in legs Musculoskeletal:  [x] Arthritis   [] Joint swelling   [x] Joint pain   [] Low back pain Hematologic:  [] Easy bruising  [] Easy bleeding   [] Hypercoagulable state   [] Anemic  [] Hepatitis Gastrointestinal:  [] Blood in stool   [] Vomiting blood  [x] Gastroesophageal reflux/heartburn   [] Difficulty swallowing. Genitourinary:  [] Chronic kidney disease   [] Difficult urination  [] Frequent urination  [] Burning with urination   [] Blood in urine Skin:  [] Rashes   [] Ulcers   [] Wounds Psychological:  [] History of anxiety   []  History of major depression.  Physical Examination  Vitals:   05/11/18 0049 05/11/18 0201 05/11/18 0331 05/11/18 0807  BP: (!) 150/84  (!) 157/80 (!) 166/69  Pulse: 64 65 62 (!) 57  Resp: 18  18 18   Temp: 98.4 F (36.9 C)  97.7 F (36.5 C) (!) 97.5 F (36.4 C)  TempSrc: Oral  Oral Oral  SpO2: 99% 99% 98% 100%  Weight:       Height:       Body mass index is 27.26 kg/m. Gen:  WD/WN, NAD.  Appears younger than stated age Head: [place]/AT, No temporalis wasting.  Ear/Nose/Throat: Hearing grossly intact, nares w/o erythema or drainage, oropharynx w/o Erythema/Exudate Eyes: Sclera non-icteric, conjunctiva clear Neck: Trachea midline.  No JVD.  Pulmonary:  Good air movement, respirations not labored, equal bilaterally.  Cardiac: RRR, no JVD Vascular:  Vessel Right Left  Radial Palpable Palpable                                    Musculoskeletal:   Extremities without ischemic changes.  No deformity or atrophy. No edema.  Strength is 5 out of 5 in the right upper extremity and bilateral lower extremities.  Strength is 2-3 out of 5 in the left upper extremity. Neurologic:  Left upper extremity weakness is present and the sensation is decreased in the left upper extremity as well speech is fluent. Motor exam as listed above. Psychiatric: Judgment intact, Mood & affect appropriate for pt's clinical situation. Dermatologic: No rashes or ulcers noted.  No cellulitis or open wounds.       CBC Lab Results  Component Value Date   WBC 7.1 05/10/2018   HGB 14.8 05/10/2018   HCT 43.7 05/10/2018   MCV 91.1 05/10/2018   PLT 138 (L) 05/10/2018    BMET    Component Value Date/Time   NA 137 05/10/2018 1042   K 4.3 05/10/2018 1042   CL 107 05/10/2018 1042   CO2 21 (L) 05/10/2018 1042   GLUCOSE 108 (H) 05/10/2018 1042   BUN 32 (H) 05/10/2018 1042   CREATININE 1.30 (H) 05/10/2018 1042   CALCIUM 9.1 05/10/2018 1042   GFRNONAA 51 (L) 05/10/2018 1042   GFRAA 59 (L) 05/10/2018 1042   Estimated Creatinine Clearance: 47.6 mL/min (A) (by C-G formula based on SCr of 1.3 mg/dL (H)).  COAG Lab Results  Component Value Date   INR 1.05 05/10/2018    Radiology Ct Angio Head W Or Wo Contrast  Result Date: 05/10/2018 CLINICAL DATA:  Left arm weakness beginning 0400 hours EXAM: CT ANGIOGRAPHY HEAD AND NECK  TECHNIQUE: Multidetector CT imaging of the head and neck was performed using the standard protocol during bolus administration of intravenous contrast. Multiplanar CT image reconstructions and MIPs were obtained to evaluate the vascular anatomy. Carotid stenosis measurements (when applicable) are obtained utilizing NASCET criteria, using the distal internal carotid diameter as the denominator. CONTRAST:  58mL OMNIPAQUE IOHEXOL 350 MG/ML SOLN COMPARISON:  Head CT same day. FINDINGS: CTA NECK FINDINGS Aortic arch: Aortic atherosclerosis without aneurysm or dissection. Branching pattern of the brachiocephalic vessels is normal without origin stenosis. Right carotid system: Common carotid artery is tortuous but widely patent to the bifurcation region. There is soft and calcified plaque at the carotid bifurcation. Minimal diameter of the proximal ICA is 4 mm. Compared to a more distal cervical ICA diameter of 5 mm, this indicates a 20% stenosis. The distal cervical internal carotid artery beginning at about the C2-3 level is markedly abnormal. 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 Narrowing in this segment is estimated at 30-50%. Left carotid system: Common carotid artery is tortuous but sufficiently patent to the bifurcation. At the carotid bifurcation, there is atherosclerotic disease with soft and calcified plaque. Minimal diameter of the proximal ICA is 3.5 mm. Compared to a more distal cervical ICA diameter of 5 mm, this indicates a 20% stenosis. Similarly on this side, the upper cervical internal carotid artery is abnormal with calcification and irregularity. This could be due to chronic dissection or simple atherosclerosis. Minimal diameter  of the vessel just proximal to the skull base is 1 mm or less. This places the patient at risk left  ICA complete occlusion. Vertebral arteries: Both vertebral artery origins show some atherosclerotic change but are sufficiently patent. Both vertebral arteries are patent through the cervical region to the foramen magnum. Skeleton: Mild cervical spondylosis. Other neck: No soft tissue mass or lymphadenopathy. Right submandibular gland is either atrophic or surgically absent. Upper chest: Lung apices are clear. Review of the MIP images confirms the above findings CTA HEAD FINDINGS Anterior circulation: Both internal carotid arteries show flow through the carotid siphon region. There is extensive siphon calcification. Stenosis in that region is estimated at 50% on each side. Supraclinoid internal carotid arteries are widely patent. The anterior and middle cerebral vessels show flow without proximal stenosis, aneurysm or vascular malformation. No missing larger vessels are identified. Posterior circulation: Both vertebral arteries are patent at foramen magnum. There is a 70% stenosis of the right V4 segment, but the vessel does show flow distal to that to the basilar. There is a 50% stenosis of the left V4 segment, but the vessel is also patent to the basilar. No basilar stenosis. Posterior circulation branch vessels show flow. Venous sinuses: Patent and normal. Anatomic variants: None significant. Delayed phase: No abnormal enhancement. 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 20% stenosis in the ICA bulb region on each side. Abnormal distal cervical internal carotid arteries on each side. 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 There is narrowing and irregularity of the upper cervical internal carotid artery, narrowing in that region estimated at 30-50%. On the left, I do not see a pseudo aneurysm, but there is more severe narrowing and irregularity of the upper cervical ICA, with  the lumen at 1 mm or smaller. Therefore, the patient is at considerable risk distal left ICA occlusion. Atherosclerotic disease in both carotid siphon regions with stenosis estimated at 50%. No intracranial occlusion or correctable stenosis identified. Stenotic disease in both V4 segments, estimated at 70% on the right and 50% on the left. Electronically Signed   By: Nelson Chimes M.D.   On: 05/10/2018 13:20   Ct Head Wo Contrast  Result Date: 05/10/2018 CLINICAL DATA:  Left arm weakness for several hours EXAM: CT HEAD WITHOUT CONTRAST TECHNIQUE: Contiguous axial images were obtained from the base of the skull through the vertex without intravenous contrast. COMPARISON:  None. FINDINGS: Brain: No evidence of acute infarction, hemorrhage, hydrocephalus, extra-axial collection or mass lesion/mass effect. Vascular: No hyperdense vessel or unexpected calcification. Skull: Normal. Negative for fracture or focal lesion. Sinuses/Orbits: No acute finding. Other: None. IMPRESSION: No acute intracranial abnormality noted. Electronically Signed   By: Inez Catalina M.D.   On: 05/10/2018 11:27   Ct Angio Neck W Or Wo Contrast  Result Date: 05/10/2018 CLINICAL DATA:  Left arm weakness beginning 0400 hours EXAM: CT ANGIOGRAPHY HEAD AND NECK TECHNIQUE: Multidetector CT imaging of the head and neck was performed using the standard protocol during bolus administration of intravenous contrast. Multiplanar CT image reconstructions and MIPs were obtained to evaluate the vascular anatomy. Carotid stenosis measurements (when applicable) are obtained utilizing NASCET criteria, using the distal internal carotid diameter as the denominator. CONTRAST:  48mL OMNIPAQUE IOHEXOL 350 MG/ML SOLN COMPARISON:  Head CT same day. FINDINGS: CTA NECK FINDINGS Aortic arch: Aortic atherosclerosis without aneurysm or dissection. Branching pattern of the brachiocephalic vessels is  normal without origin stenosis. Right carotid system: Common carotid  artery is tortuous but widely patent to the bifurcation region. There is soft and calcified plaque at the carotid bifurcation. Minimal diameter of the proximal ICA is 4 mm. Compared to a more distal cervical ICA diameter of 5 mm, this indicates a 20% stenosis. The distal cervical internal carotid artery beginning at about the C2-3 level is markedly abnormal. 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 Narrowing in this segment is estimated at 30-50%. Left carotid system: Common carotid artery is tortuous but sufficiently patent to the bifurcation. At the carotid bifurcation, there is atherosclerotic disease with soft and calcified plaque. Minimal diameter of the proximal ICA is 3.5 mm. Compared to a more distal cervical ICA diameter of 5 mm, this indicates a 20% stenosis. Similarly on this side, the upper cervical internal carotid artery is abnormal with calcification and irregularity. This could be due to chronic dissection or simple atherosclerosis. Minimal diameter of the vessel just proximal to the skull base is 1 mm or less. This places the patient at risk left ICA complete occlusion. Vertebral arteries: Both vertebral artery origins show some atherosclerotic change but are sufficiently patent. Both vertebral arteries are patent through the cervical region to the foramen magnum. Skeleton: Mild cervical spondylosis. Other neck: No soft tissue mass or lymphadenopathy. Right submandibular gland is either atrophic or surgically absent. Upper chest: Lung apices are clear. Review of the MIP images confirms the above findings CTA HEAD FINDINGS Anterior circulation: Both internal carotid arteries show flow through the carotid siphon region. There is extensive siphon calcification. Stenosis in that region is estimated at 50% on each side. Supraclinoid  internal carotid arteries are widely patent. The anterior and middle cerebral vessels show flow without proximal stenosis, aneurysm or vascular malformation. No missing larger vessels are identified. Posterior circulation: Both vertebral arteries are patent at foramen magnum. There is a 70% stenosis of the right V4 segment, but the vessel does show flow distal to that to the basilar. There is a 50% stenosis of the left V4 segment, but the vessel is also patent to the basilar. No basilar stenosis. Posterior circulation branch vessels show flow. Venous sinuses: Patent and normal. Anatomic variants: None significant. Delayed phase: No abnormal enhancement. 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 20% stenosis in the ICA bulb region on each side. Abnormal distal cervical internal carotid arteries on each side. 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 There is narrowing and irregularity of the upper cervical internal carotid artery, narrowing in that region estimated at 30-50%. On the left, I do not see a pseudo aneurysm, but there is more severe narrowing and irregularity of the upper cervical ICA, with the lumen at 1 mm or smaller. Therefore, the patient is at considerable risk distal left ICA occlusion. Atherosclerotic disease in both carotid siphon regions with stenosis estimated at 50%. No intracranial occlusion or correctable stenosis identified. Stenotic disease in both V4 segments, estimated at 70% on the right and 50% on the left. Electronically Signed   By: Nelson Chimes M.D.   On: 05/10/2018 13:20   Mr Brain Wo Contrast  Result Date: 05/11/2018 CLINICAL DATA:  82 y/o  M; left upper extremity weakness. EXAM: MRI HEAD WITHOUT CONTRAST TECHNIQUE: Multiplanar,  multiecho pulse sequences of the brain and surrounding structures were obtained without intravenous contrast.  COMPARISON:  05/10/2018 CT head, CTA head, CT perfusion head. FINDINGS: Brain: Several punctate foci of reduced diffusion compatible with acute/early subacute infarction are present scattered throughout the right frontal parietal cortices, right caudate head, right temporal periventricular white matter, and the right temporal occipital junction. The distribution suggest watershed infarct. No associated hemorrhage or mass effect. Several nonspecific foci of T2 FLAIR hyperintense signal abnormality in subcortical and periventricular white matter are compatible with moderate chronic microvascular ischemic changes for age. Moderate brain parenchymal volume loss. Small chronic lacunar infarcts within the bilateral caudate heads. No focal mass effect, extra-axial collection, hydrocephalus, or herniation. Vascular: Normal flow voids. Skull and upper cervical spine: Normal marrow signal. Sinuses/Orbits: Negative. Other: None. IMPRESSION: 1. Several punctate acute/early subacute infarctions in a right-sided probable watershed distribution. No associated hemorrhage or mass effect. 2. Moderate chronic microvascular ischemic changes and parenchymal volume loss of the brain. Electronically Signed   By: Kristine Garbe M.D.   On: 05/11/2018 00:08   Ct Cerebral Perfusion W Contrast  Result Date: 05/10/2018 CLINICAL DATA:  Left arm weakness. EXAM: CT PERFUSION BRAIN TECHNIQUE: Multiphase CT imaging of the brain was performed following IV bolus contrast injection. Subsequent parametric perfusion maps were calculated using RAPID software. CONTRAST:  61mL OMNIPAQUE IOHEXOL 350 MG/ML SOLN COMPARISON:  Head CT and CTA from earlier today. FINDINGS: CT Brain Perfusion Findings: CBF (<30%) Volume: 46mL Perfusion (Tmax>6.0s) volume: 61mL ASPECTS on noncontrast CT Head: 10 at 10:55 a.m. today . When perfusion thresholds are liberalized to 4 seconds, there is a nearly 21 cc area of apparent ischemia in the right frontal parietal  region. IMPRESSION: 1. No infarct or penumbra by standard CT perfusion values. There is suggestion of slightly delayed perfusion in the right frontal parietal region of Gamble given left arm symptoms. No resolved underlying vascular lesion by preceding CTA. 2. Motion degraded. Electronically Signed   By: Monte Fantasia M.D.   On: 05/10/2018 14:29      Assessment/Plan 1.  Recent stroke.  This was right-sided.  His carotid disease on the right is actually quite mild and he would not likely benefit from any intervention on that side for stroke risk reduction.  He is going continue outpatient therapy 2.  Carotid disease.  Right-sided carotid lesions are quite mild.  His more significant left-sided lesion that appears to be at least 70% is at and just into the base of the skull and would not be amenable to open surgical therapy.  Particularly for more distal lesions and the intracranial disease he has as well, medical management with aspirin, Plavix, and a statin agent would be generally recommended.  No surgery or stenting would be planned at this time.  I will plan on seeing him back in the office in 2 to 3 months in follow-up. 3.  Diabetes.  Stable on outpatient medications and blood glucose control important in reducing the progression of atherosclerotic disease. Also, involved in wound healing. On appropriate medications. 4.  Hypertension. Stable on outpatient medications and blood pressure control important in reducing the progression of atherosclerotic disease. On appropriate oral medications.    Leotis Pain, MD  05/11/2018 2:24 PM    This Gamble was created with Dragon medical transcription system.  Any error is purely unintentional

## 2018-05-11 NOTE — Progress Notes (Signed)
*  PRELIMINARY RESULTS* Echocardiogram 2D Echocardiogram has been performed.  Nathaniel Gamble Nathaniel Gamble 05/11/2018, 9:28 AM

## 2018-05-11 NOTE — Care Management Note (Signed)
Case Management Note  Patient Details  Name: Nathaniel Gamble MRN: [id number] Date of Birth: 1935/11/29  Subjective/Objective:                  Admitted to [facility] with the diagnosis of acute CVA. Lives with wife, Meryl Crutch [phone]). Last seen Dr. Amaryllis Dyke about a month ago. Prescriptions are filled at [facility]. No home health. No skilled nursing. No home oxygen, Rolling walker, raised toilet seat, cane, and shower chair in the home. Takes care of all basic and instrumental activities of daily living himself, no driving at this time. Last fall was May 1st, Good appetite.   Action/Plan: Physical therapy, occupational therapy, and aide ordered. Discussed agencies at the bedside. Wife has had [facility] in the past. Mr. Shiller chose [facility]. Floydene Flock, [facility] representative updated   Expected Discharge Date:  05/11/18               Expected Discharge Plan:     In-House Referral:     Discharge planning Services   yes  Post Acute Care Choice:   yes Choice offered to:   Mr Isip  DME Arranged:    DME Agency:     [facility] Arranged:   yes [place] Agency:   [facility]   Status of Service:     If discussed at [place] of Stay Meetings, dates discussed:    Additional Comments:  Shelbie Ammons, RN MSN CCM Care Management [phone] 05/11/2018, 1:38 PM

## 2018-05-11 NOTE — Progress Notes (Signed)
OT Cancellation Note  Patient Details Name: Nathaniel Gamble MRN: [id number] DOB: Jul 11, 1936   Cancelled Treatment:    Reason Eval/Treat Not Completed: Patient at procedure or test/ unavailable.  Order received and chart reviewed.  Pt eating breakfast first attempt and assisted with cutting meat for him and second attempt he was out of room getting and Echocardiogram.  Will attempt again later today.  Chrys Racer, OTR/L ascom [phone] 05/11/18, 9:37 AM

## 2018-05-11 NOTE — Discharge Summary (Addendum)
[facility] [place] NAME: Nathaniel Gamble    MR#:  [id number]  DATE OF BIRTH:  [birth year]  DATE OF ADMISSION:  05/10/2018 ADMITTING PHYSICIAN: Hillary Bow, MD  DATE OF DISCHARGE: 05/11/2018  PRIMARY CARE PHYSICIAN: Madelyn Brunner, MD    ADMISSION DIAGNOSIS:  Muscle weakness of left upper extremity [M62.81] Cerebrovascular accident (CVA), unspecified mechanism ([place]) [I63.9]  DISCHARGE DIAGNOSIS:  Active Problems:   Acute CVA (cerebrovascular accident) ([place])   SECONDARY DIAGNOSIS:   Past Medical History:  Diagnosis Date  . Abdominal aortic aneurysm ([place])    monitored by dr. Ubaldo Glassing  . Abdominal aortic aneurysm ([place])   . Cancer ([place]) 1976   melanoma, under right arm, removed  . Chronic kidney disease 2011   stent right kidney  . Coronary artery disease   . Diabetes mellitus without complication ([place])   . Hypertension   . Jaw fracture ([place])   . Myocardial infarction ([place])   . Peripheral vascular disease ([place])   . PONV (postoperative nausea and vomiting)   . Renal artery stenosis [facility])     HOSPITAL COURSE:  82 year old male with history of diabetes, CAD/CABG, chronic kidney disease stage III who presented to the emergency room due to left upper extremity weakness.  1.  Acute right sided watershed distribution stroke with left ICA occlusion: Patient was evaluated by neurology.  Patient will be discharged on aspirin and statin.  Patient will also be discharged on Plavix.   Symptoms are improving. ECHo showed nml Ef with no source of thrombus.   2.  Left ICA stenosis: Patient will need vascular procedure in approximately 2 weeks.  Patient will have cardiac clearance prior to procedure as per recommendations by vascular surgery.  3.  Diabetes: Continue outpatient regimen  4.  Chronic kidney disease status post right kidney stent with stable creatinine stage III  5.  CAD/CABG: Continue aspirin, Coreg, statin He will follow-up  with Dr. Ubaldo Glassing  6.  Essential hypertension: Patient may continue losartan, Coreg and Norvasc   DISCHARGE CONDITIONS AND DIET:  Stable Cardiac diabetic diet  CONSULTS OBTAINED:  Treatment Team:  Catarina Hartshorn, MD Alexis Goodell, MD Bettey Costa, MD Schnier, Dolores Lory, MD  DRUG ALLERGIES:   Allergies  Allergen Reactions  . Lisinopril Swelling  . Tetanus Toxoids Swelling    DISCHARGE MEDICATIONS:   Allergies as of 05/11/2018      Reactions   Lisinopril Swelling   Tetanus Toxoids Swelling      Medication List    STOP taking these medications   azelastine 0.1 % nasal spray Commonly known as:  ASTELIN     TAKE these medications   allopurinol 100 MG tablet Commonly known as:  ZYLOPRIM Take 100 mg by mouth daily.   amLODipine 10 MG tablet Commonly known as:  NORVASC Take 10 mg by mouth daily.   aspirin EC 81 MG tablet Take 81 mg by mouth daily.   beta carotene w/minerals tablet Take 1 tablet by mouth daily.   carvedilol 25 MG tablet Commonly known as:  COREG Take 25 mg by mouth 2 (two) times daily with a meal.   clopidogrel 75 MG tablet Commonly known as:  PLAVIX Take 1 tablet (75 mg total) by mouth daily.   losartan 50 MG tablet Commonly known as:  COZAAR Take 50 mg by mouth daily.   simvastatin 40 MG tablet Commonly known as:  ZOCOR Take 40 mg by mouth at bedtime.  Today   CHIEF COMPLAINT:   Patient doing better this am still with some left sided weakness in arm but improved No headache   VITAL SIGNS:  Blood pressure (!) 166/69, pulse (!) 57, temperature (!) 97.5 F (36.4 C), temperature source Oral, resp. rate 18, height 5\' 10"  (1.778 m), weight 86.2 kg (190 lb), SpO2 100 %.   REVIEW OF SYSTEMS:  Review of Systems  Constitutional: Negative.  Negative for chills, fever and malaise/fatigue.  HENT: Negative.  Negative for ear discharge, ear pain, hearing loss, nosebleeds and sore throat.   Eyes: Negative.  Negative for  blurred vision and pain.  Respiratory: Negative.  Negative for cough, hemoptysis, shortness of breath and wheezing.   Cardiovascular: Negative.  Negative for chest pain, palpitations and leg swelling.  Gastrointestinal: Negative.  Negative for abdominal pain, blood in stool, diarrhea, nausea and vomiting.  Genitourinary: Negative.  Negative for dysuria.  Musculoskeletal: Negative.  Negative for back pain.  Skin: Negative.   Neurological: Positive for focal weakness. Negative for dizziness, tremors, speech change, seizures and headaches.  Endo/Heme/Allergies: Negative.  Does not bruise/bleed easily.  Psychiatric/Behavioral: Negative.  Negative for depression, hallucinations and suicidal ideas.     PHYSICAL EXAMINATION:  GENERAL:  82 y.o.-year-old patient lying in the bed with no acute distress.  NECK:  Supple, no jugular venous distention. No thyroid enlargement, no tenderness.  LUNGS: Normal breath sounds bilaterally, no wheezing, rales,rhonchi  No use of accessory muscles of respiration.  CARDIOVASCULAR: S1, S2 normal. No murmurs, rubs, or gallops.  ABDOMEN: Soft, non-tender, non-distended. Bowel sounds present. No organomegaly or mass.  EXTREMITIES: No pedal edema, cyanosis, or clubbing.  PSYCHIATRIC: The patient is alert and oriented x 3.  SKIN: No obvious rash, lesion, or ulcer.  NEURO Cn 2-12 intact with LUE 4/5 DATA REVIEW:   CBC Recent Labs  Lab 05/10/18 1042  WBC 7.1  HGB 14.8  HCT 43.7  PLT 138*    Chemistries  Recent Labs  Lab 05/10/18 1042  NA 137  K 4.3  CL 107  CO2 21*  GLUCOSE 108*  BUN 32*  CREATININE 1.30*  CALCIUM 9.1  AST 25  ALT 19  ALKPHOS 74  BILITOT 0.8    Cardiac Enzymes Recent Labs  Lab 05/10/18 1042  TROPONINI <0.03    Microbiology Results  @[email or web address] @  RADIOLOGY:  Ct Angio Head W Or Wo Contrast  Result Date: 05/10/2018 CLINICAL DATA:  Left arm weakness beginning 0400 hours EXAM: CT ANGIOGRAPHY HEAD AND NECK TECHNIQUE:  Multidetector CT imaging of the head and neck was performed using the standard protocol during bolus administration of intravenous contrast. Multiplanar CT image reconstructions and MIPs were obtained to evaluate the vascular anatomy. Carotid stenosis measurements (when applicable) are obtained utilizing NASCET criteria, using the distal internal carotid diameter as the denominator. CONTRAST:  62mL OMNIPAQUE IOHEXOL 350 MG/ML SOLN COMPARISON:  Head CT same day. FINDINGS: CTA NECK FINDINGS Aortic arch: Aortic atherosclerosis without aneurysm or dissection. Branching pattern of the brachiocephalic vessels is normal without origin stenosis. Right carotid system: Common carotid artery is tortuous but widely patent to the bifurcation region. There is soft and calcified plaque at the carotid bifurcation. Minimal diameter of the proximal ICA is 4 mm. Compared to a more distal cervical ICA diameter of 5 mm, this indicates a 20% stenosis. The distal cervical internal carotid artery beginning at about the C2-3 level is markedly abnormal. There appears to be a partially calcified pseudoaneurysm followed by irregularity of the upper cervical internal  carotid artery to the skull base level, consistent with atherosclerotic change or healed dissection. Narrowing in this segment is estimated at 30-50%. Left carotid system: Common carotid artery is tortuous but sufficiently patent to the bifurcation. At the carotid bifurcation, there is atherosclerotic disease with soft and calcified plaque. Minimal diameter of the proximal ICA is 3.5 mm. Compared to a more distal cervical ICA diameter of 5 mm, this indicates a 20% stenosis. Similarly on this side, the upper cervical internal carotid artery is abnormal with calcification and irregularity. This could be due to chronic dissection or simple atherosclerosis. Minimal diameter of the vessel just proximal to the skull base is 1 mm or less. This places the patient at risk left ICA complete  occlusion. Vertebral arteries: Both vertebral artery origins show some atherosclerotic change but are sufficiently patent. Both vertebral arteries are patent through the cervical region to the foramen magnum. Skeleton: Mild cervical spondylosis. Other neck: No soft tissue mass or lymphadenopathy. Right submandibular gland is either atrophic or surgically absent. Upper chest: Lung apices are clear. Review of the MIP images confirms the above findings CTA HEAD FINDINGS Anterior circulation: Both internal carotid arteries show flow through the carotid siphon region. There is extensive siphon calcification. Stenosis in that region is estimated at 50% on each side. Supraclinoid internal carotid arteries are widely patent. The anterior and middle cerebral vessels show flow without proximal stenosis, aneurysm or vascular malformation. No missing larger vessels are identified. Posterior circulation: Both vertebral arteries are patent at foramen magnum. There is a 70% stenosis of the right V4 segment, but the vessel does show flow distal to that to the basilar. There is a 50% stenosis of the left V4 segment, but the vessel is also patent to the basilar. No basilar stenosis. Posterior circulation branch vessels show flow. Venous sinuses: Patent and normal. Anatomic variants: None significant. Delayed phase: No abnormal enhancement. 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 20% stenosis in the ICA bulb region on each side. Abnormal distal cervical internal carotid arteries on each side. 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 There is narrowing and irregularity of the upper cervical internal carotid artery, narrowing in that region estimated at 30-50%. On the left, I do not see a pseudo aneurysm, but there is more severe narrowing and irregularity of the upper cervical ICA, with the lumen at  1 mm or smaller. Therefore, the patient is at considerable risk distal left ICA occlusion. Atherosclerotic disease in both carotid siphon regions with stenosis estimated at 50%. No intracranial occlusion or correctable stenosis identified. Stenotic disease in both V4 segments, estimated at 70% on the right and 50% on the left. Electronically Signed   By: Nelson Chimes M.D.   On: 05/10/2018 13:20   Ct Head Wo Contrast  Result Date: 05/10/2018 CLINICAL DATA:  Left arm weakness for several hours EXAM: CT HEAD WITHOUT CONTRAST TECHNIQUE: Contiguous axial images were obtained from the base of the skull through the vertex without intravenous contrast. COMPARISON:  None. FINDINGS: Brain: No evidence of acute infarction, hemorrhage, hydrocephalus, extra-axial collection or mass lesion/mass effect. Vascular: No hyperdense vessel or unexpected calcification. Skull: Normal. Negative for fracture or focal lesion. Sinuses/Orbits: No acute finding. Other: None. IMPRESSION: No acute intracranial abnormality noted. Electronically Signed   By: Inez Catalina M.D.   On: 05/10/2018 11:27   Ct Angio Neck W Or Wo Contrast  Result Date:  05/10/2018 CLINICAL DATA:  Left arm weakness beginning 0400 hours EXAM: CT ANGIOGRAPHY HEAD AND NECK TECHNIQUE: Multidetector CT imaging of the head and neck was performed using the standard protocol during bolus administration of intravenous contrast. Multiplanar CT image reconstructions and MIPs were obtained to evaluate the vascular anatomy. Carotid stenosis measurements (when applicable) are obtained utilizing NASCET criteria, using the distal internal carotid diameter as the denominator. CONTRAST:  69mL OMNIPAQUE IOHEXOL 350 MG/ML SOLN COMPARISON:  Head CT same day. FINDINGS: CTA NECK FINDINGS Aortic arch: Aortic atherosclerosis without aneurysm or dissection. Branching pattern of the brachiocephalic vessels is normal without origin stenosis. Right carotid system: Common carotid artery is  tortuous but widely patent to the bifurcation region. There is soft and calcified plaque at the carotid bifurcation. Minimal diameter of the proximal ICA is 4 mm. Compared to a more distal cervical ICA diameter of 5 mm, this indicates a 20% stenosis. The distal cervical internal carotid artery beginning at about the C2-3 level is markedly abnormal. 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 Narrowing in this segment is estimated at 30-50%. Left carotid system: Common carotid artery is tortuous but sufficiently patent to the bifurcation. At the carotid bifurcation, there is atherosclerotic disease with soft and calcified plaque. Minimal diameter of the proximal ICA is 3.5 mm. Compared to a more distal cervical ICA diameter of 5 mm, this indicates a 20% stenosis. Similarly on this side, the upper cervical internal carotid artery is abnormal with calcification and irregularity. This could be due to chronic dissection or simple atherosclerosis. Minimal diameter of the vessel just proximal to the skull base is 1 mm or less. This places the patient at risk left ICA complete occlusion. Vertebral arteries: Both vertebral artery origins show some atherosclerotic change but are sufficiently patent. Both vertebral arteries are patent through the cervical region to the foramen magnum. Skeleton: Mild cervical spondylosis. Other neck: No soft tissue mass or lymphadenopathy. Right submandibular gland is either atrophic or surgically absent. Upper chest: Lung apices are clear. Review of the MIP images confirms the above findings CTA HEAD FINDINGS Anterior circulation: Both internal carotid arteries show flow through the carotid siphon region. There is extensive siphon calcification. Stenosis in that region is estimated at 50% on each side. Supraclinoid internal  carotid arteries are widely patent. The anterior and middle cerebral vessels show flow without proximal stenosis, aneurysm or vascular malformation. No missing larger vessels are identified. Posterior circulation: Both vertebral arteries are patent at foramen magnum. There is a 70% stenosis of the right V4 segment, but the vessel does show flow distal to that to the basilar. There is a 50% stenosis of the left V4 segment, but the vessel is also patent to the basilar. No basilar stenosis. Posterior circulation branch vessels show flow. Venous sinuses: Patent and normal. Anatomic variants: None significant. Delayed phase: No abnormal enhancement. 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 20% stenosis in the ICA bulb region on each side. Abnormal distal cervical internal carotid arteries on each side. 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 There is narrowing and irregularity of the upper cervical internal carotid artery, narrowing in that region estimated at 30-50%. On the left, I do not see a pseudo aneurysm, but there is more severe narrowing and irregularity  of the upper cervical ICA, with the lumen at 1 mm or smaller. Therefore, the patient is at considerable risk distal left ICA occlusion. Atherosclerotic disease in both carotid siphon regions with stenosis estimated at 50%. No intracranial occlusion or correctable stenosis identified. Stenotic disease in both V4 segments, estimated at 70% on the right and 50% on the left. Electronically Signed   By: Nelson Chimes M.D.   On: 05/10/2018 13:20   Mr Brain Wo Contrast  Result Date: 05/11/2018 CLINICAL DATA:  82 y/o  M; left upper extremity weakness. EXAM: MRI HEAD WITHOUT CONTRAST TECHNIQUE: Multiplanar, multiecho pulse sequences of the brain and surrounding structures were obtained without intravenous contrast. COMPARISON:   05/10/2018 CT head, CTA head, CT perfusion head. FINDINGS: Brain: Several punctate foci of reduced diffusion compatible with acute/early subacute infarction are present scattered throughout the right frontal parietal cortices, right caudate head, right temporal periventricular white matter, and the right temporal occipital junction. The distribution suggest watershed infarct. No associated hemorrhage or mass effect. Several nonspecific foci of T2 FLAIR hyperintense signal abnormality in subcortical and periventricular white matter are compatible with moderate chronic microvascular ischemic changes for age. Moderate brain parenchymal volume loss. Small chronic lacunar infarcts within the bilateral caudate heads. No focal mass effect, extra-axial collection, hydrocephalus, or herniation. Vascular: Normal flow voids. Skull and upper cervical spine: Normal marrow signal. Sinuses/Orbits: Negative. Other: None. IMPRESSION: 1. Several punctate acute/early subacute infarctions in a right-sided probable watershed distribution. No associated hemorrhage or mass effect. 2. Moderate chronic microvascular ischemic changes and parenchymal volume loss of the brain. Electronically Signed   By: Kristine Garbe M.D.   On: 05/11/2018 00:08   Ct Cerebral Perfusion W Contrast  Result Date: 05/10/2018 CLINICAL DATA:  Left arm weakness. EXAM: CT PERFUSION BRAIN TECHNIQUE: Multiphase CT imaging of the brain was performed following IV bolus contrast injection. Subsequent parametric perfusion maps were calculated using RAPID software. CONTRAST:  20mL OMNIPAQUE IOHEXOL 350 MG/ML SOLN COMPARISON:  Head CT and CTA from earlier today. FINDINGS: CT Brain Perfusion Findings: CBF (<30%) Volume: 17mL Perfusion (Tmax>6.0s) volume: 69mL ASPECTS on noncontrast CT Head: 10 at 10:55 a.m. today . When perfusion thresholds are liberalized to 4 seconds, there is a nearly 21 cc area of apparent ischemia in the right frontal parietal region.  IMPRESSION: 1. No infarct or penumbra by standard CT perfusion values. There is suggestion of slightly delayed perfusion in the right frontal parietal region of note given left arm symptoms. No resolved underlying vascular lesion by preceding CTA. 2. Motion degraded. Electronically Signed   By: Monte Fantasia M.D.   On: 05/10/2018 14:29      Allergies as of 05/11/2018      Reactions   Lisinopril Swelling   Tetanus Toxoids Swelling      Medication List    STOP taking these medications   azelastine 0.1 % nasal spray Commonly known as:  ASTELIN     TAKE these medications   allopurinol 100 MG tablet Commonly known as:  ZYLOPRIM Take 100 mg by mouth daily.   amLODipine 10 MG tablet Commonly known as:  NORVASC Take 10 mg by mouth daily.   aspirin EC 81 MG tablet Take 81 mg by mouth daily.   beta carotene w/minerals tablet Take 1 tablet by mouth daily.   carvedilol 25 MG tablet Commonly known as:  COREG Take 25 mg by mouth 2 (two) times daily with a meal.   clopidogrel 75 MG tablet Commonly known as:  PLAVIX Take  1 tablet (75 mg total) by mouth daily.   losartan 50 MG tablet Commonly known as:  COZAAR Take 50 mg by mouth daily.   simvastatin 40 MG tablet Commonly known as:  ZOCOR Take 40 mg by mouth at bedtime.          Management plans discussed with the patient and wife and they are in agreement. Stable for discharge home with [facility]  Patient should follow up with pcp  CODE STATUS:     Code Status Orders  (From admission, onward)        Start     Ordered   05/10/18 1500  Full code  Continuous     05/10/18 1501    Code Status History    This patient has a current code status but no historical code status.    Advance Directive Documentation     Most Recent Value  Type of Advance Directive  Healthcare Power of Attorney, Living will  Pre-existing out of facility DNR order (yellow form or pink MOST form)  -  "MOST" Form in Place?  -      TOTAL TIME  TAKING CARE OF THIS PATIENT: 38 minutes.    Note: This dictation was prepared with Dragon dictation along with smaller phrase technology. Any transcriptional errors that result from this process are unintentional.  Doneisha Ivey M.D on 05/11/2018 at 11:40 AM  Between 7am to 6pm - Pager - [phone] After 6pm go to [email or web address] - password [email or web address]  Office  [phone]  CC: Primary care physician; Madelyn Brunner, MD

## 2018-05-14 ENCOUNTER — Telehealth: Payer: Self-pay

## 2018-05-14 NOTE — Telephone Encounter (Signed)
EMMI Follow-up: Noted on the report that patient had several questions regarding discharge paperwork.  Question of who to call if condition changes and has unfilled Rx's.  I talked with Nathaniel Gamble and he said his wife may have taken the call and she wasn't home right now.  I asked him if they had an opportunity to review discharge paperwork and he was aware of appointments and had one this afternoon with Dr. Ubaldo Glassing. Reminded him to contact PCP if any change in condition.  Asked him about unfilled Rx. And he said the only new one was Plavix and they had that filled.  Wife & son questioned one medication he had for gout and he only kept in case of a flair up and they had taken all his medications to pharmacy for them to review and they kept what he needed. I let him to know there would be another automated call with a different series of questions and to let us know if he had any concerns at that time.

## 2018-05-25 ENCOUNTER — Ambulatory Visit (INDEPENDENT_AMBULATORY_CARE_PROVIDER_SITE_OTHER): Payer: BLUE CROSS/BLUE SHIELD | Admitting: Vascular Surgery

## 2018-05-25 ENCOUNTER — Encounter (INDEPENDENT_AMBULATORY_CARE_PROVIDER_SITE_OTHER): Payer: Self-pay | Admitting: Vascular Surgery

## 2018-05-25 VITALS — BP 132/74 | HR 56 | Resp 15 | Ht 70.0 in | Wt 189.0 lb

## 2018-05-25 DIAGNOSIS — I6529 Occlusion and stenosis of unspecified carotid artery: Secondary | ICD-10-CM | POA: Insufficient documentation

## 2018-05-25 DIAGNOSIS — E119 Type 2 diabetes mellitus without complications: Secondary | ICD-10-CM | POA: Diagnosis not present

## 2018-05-25 DIAGNOSIS — I639 Cerebral infarction, unspecified: Secondary | ICD-10-CM

## 2018-05-25 DIAGNOSIS — I6523 Occlusion and stenosis of bilateral carotid arteries: Secondary | ICD-10-CM

## 2018-05-25 DIAGNOSIS — I10 Essential (primary) hypertension: Secondary | ICD-10-CM

## 2018-05-25 NOTE — Assessment & Plan Note (Signed)
blood pressure control important in reducing the progression of atherosclerotic disease. On appropriate oral medications.  

## 2018-05-25 NOTE — Assessment & Plan Note (Signed)
blood glucose control important in reducing the progression of atherosclerotic disease. Also, involved in wound healing. On appropriate medications.  

## 2018-05-25 NOTE — Assessment & Plan Note (Signed)
He was found to have mild cervical carotid artery stenosis with more significant disease at the base of the skull and in the intracranial circulation although still in the moderate range. Continue medical management with aspirin, Plavix, and Zocor.  No intervention of benefit currently.  Recheck in 6 months

## 2018-05-25 NOTE — Assessment & Plan Note (Signed)
Intracranial disease predominantly.  Medical management recommended.

## 2018-05-25 NOTE — Progress Notes (Signed)
MRN : [id number]  Nathaniel Gamble is a 82 y.o. ([birth year]) male who presents with chief complaint of  Chief Complaint  Patient presents with  . Follow-up    2 week [facility] f/u  .  History of Present Illness: Patient returns today in follow up of carotid disease and recent neurologic event after hospital consultation.  Since he has gone home, no further episodes or events have occurred.  He is doing well today without specific complaints.  He was found to have mild cervical carotid artery stenosis with more significant disease at the base of the skull and in the intracranial circulation although still in the moderate range.  Medical management alone was recommended which he has tolerated well thus far  Current Outpatient Medications  Medication Sig Dispense Refill  . azelastine (ASTELIN) 0.1 % nasal spray Place into the nose.    . allopurinol (ZYLOPRIM) 100 MG tablet Take 100 mg by mouth daily.    [name] amLODipine (NORVASC) 10 MG tablet Take 10 mg by mouth daily.    [name] aspirin EC 81 MG tablet Take 81 mg by mouth daily.    . beta carotene w/minerals (OCUVITE) tablet Take 1 tablet by mouth daily.    . carvedilol (COREG) 25 MG tablet Take 25 mg by mouth 2 (two) times daily with a meal.    . clopidogrel (PLAVIX) 75 MG tablet Take 1 tablet (75 mg total) by mouth daily. 30 tablet 11  . losartan (COZAAR) 50 MG tablet Take 50 mg by mouth daily.    . simvastatin (ZOCOR) 40 MG tablet Take 40 mg by mouth at bedtime.    [name] tobramycin (TOBREX) 0.3 % ophthalmic solution   5  . Vitamin D, Ergocalciferol, (DRISDOL) 50000 units CAPS capsule   1   No current facility-administered medications for this visit.     Past Medical History:  Diagnosis Date  . Abdominal aortic aneurysm ([place])    monitored by dr. Ubaldo Glassing  . Abdominal aortic aneurysm ([place])   . Cancer ([place]) 1976   melanoma, under right arm, removed  . Chronic kidney disease 2011   stent right kidney  . Coronary artery disease   . Diabetes mellitus  without complication ([place])   . Hypertension   . Jaw fracture ([place])   . Myocardial infarction ([place])   . Peripheral vascular disease ([place])   . PONV (postoperative nausea and vomiting)   . Renal artery stenosis [facility])     Past Surgical History:  Procedure Laterality Date  . CATARACT EXTRACTION Bilateral 2014  . CORONARY ARTERY BYPASS GRAFT  2010  . INGUINAL HERNIA REPAIR Left 07/09/2015   Procedure: HERNIA REPAIR INGUINAL ADULT;  Surgeon: Leonie Green, MD;  Location: [facility] ORS;  Service: General;  Laterality: Left;  [name] MANDIBLE FRACTURE SURGERY Left   . RENAL ARTERY STENT Right    Social History        Tobacco Use  . Smoking status: Former Research scientist (life sciences)  . Smokeless tobacco: Former Network engineer Use Topics  . Alcohol use: Yes    Alcohol/week: 0.6 oz    Types: 1 Glasses of wine per week  . Drug use: No    Family History No bleeding disorders, clotting disorders, aneurysms, or autoimmune diseases      Allergies  Allergen Reactions  . Lisinopril Swelling  . Tetanus Toxoids Swelling     REVIEW OF SYSTEMS (Negative unless checked)  Constitutional: [] Weight loss  [] Fever  [] Chills Cardiac: [] Chest pain   [] Chest  pressure   [x] Palpitations   [] Shortness of breath when laying flat   [] Shortness of breath at rest   [x] Shortness of breath with exertion. Vascular:  [] Pain in legs with walking   [] Pain in legs at rest   [] Pain in legs when laying flat   [] Claudication   [] Pain in feet when walking  [] Pain in feet at rest  [] Pain in feet when laying flat   [] History of DVT   [] Phlebitis   [] Swelling in legs   [] Varicose veins   [] Non-healing ulcers Pulmonary:   [] Uses home oxygen   [] Productive cough   [] Hemoptysis   [] Wheeze  [] COPD   [] Asthma Neurologic:  [] Dizziness  [] Blackouts   [] Seizures   [x] History of stroke   [x] History of TIA  [] Aphasia   [] Temporary blindness   [] Dysphagia   [] Weakness or numbness in arms   [] Weakness or numbness in legs Musculoskeletal:   [x] Arthritis   [] Joint swelling   [x] Joint pain   [] Low back pain Hematologic:  [] Easy bruising  [] Easy bleeding   [] Hypercoagulable state   [] Anemic  [] Hepatitis Gastrointestinal:  [] Blood in stool   [] Vomiting blood  [x] Gastroesophageal reflux/heartburn   [] Difficulty swallowing. Genitourinary:  [] Chronic kidney disease   [] Difficult urination  [] Frequent urination  [] Burning with urination   [] Blood in urine Skin:  [] Rashes   [] Ulcers   [] Wounds Psychological:  [] History of anxiety   []  History of major depression.     Physical Examination  BP 132/74 (BP Location: Right Arm, Patient Position: Sitting)   Pulse (!) 56   Resp 15   Ht 5' 10"  (1.778 m)   Wt 189 lb (85.7 kg)   BMI 27.12 kg/m  Gen:  WD/WN, NAD. Appears younger than stated age. Head: [place]/AT, No temporalis wasting. Ear/Nose/Throat: Hearing grossly intact, nares w/o erythema or drainage Eyes: Conjunctiva clear. Sclera non-icteric Neck: Supple.  Trachea midline Pulmonary:  Good air movement, no use of accessory muscles.  Cardiac: RRR, no JVD Vascular:  Vessel Right Left  Radial Palpable Palpable                                    Musculoskeletal: M/S 5/5 throughout.  No deformity or atrophy. No LE edema. Neurologic: Sensation grossly intact in extremities.  Symmetrical.  Speech is fluent.  Psychiatric: Judgment intact, Mood & affect appropriate for pt's clinical situation. Dermatologic: No rashes or ulcers noted.  No cellulitis or open wounds.       Labs Recent Results (from the past 2160 hour(s))  Protime-INR     Status: None   Collection Time: 05/10/18 10:42 AM  Result Value Ref Range   Prothrombin Time 13.6 11.4 - 15.2 seconds   INR 1.05     Comment: Performed at [facility], [address] [place], [place] [address]  APTT     Status: None   Collection Time: 05/10/18 10:42 AM  Result Value Ref Range   aPTT 29 24 - 36 seconds    Comment: Performed at [facility], [address] [place], [place] [address]  CBC     Status: Abnormal   Collection Time: 05/10/18 10:42 AM  Result Value Ref Range   WBC 7.1 3.8 - 10.6 K/uL   RBC 4.79 4.40 - 5.90 MIL/uL   Hemoglobin 14.8 13.0 - 18.0 g/dL   HCT 43.7 40.0 - 52.0 %   MCV 91.1 80.0 - 100.0 fL  MCH 30.9 26.0 - 34.0 pg   MCHC 34.0 32.0 - 36.0 g/dL   RDW 14.8 (H) 11.5 - 14.5 %   Platelets 138 (L) 150 - 440 K/uL    Comment: Performed at [facility], [address] [place], [place] [address]  Differential     Status: None   Collection Time: 05/10/18 10:42 AM  Result Value Ref Range   Neutrophils Relative % 56 %   Neutro Abs 4.1 1.4 - 6.5 K/uL   Lymphocytes Relative 29 %   Lymphs Abs 2.1 1.0 - 3.6 K/uL   Monocytes Relative 12 %   Monocytes Absolute 0.9 0.2 - 1.0 K/uL   Eosinophils Relative 2 %   Eosinophils Absolute 0.1 0 - 0.7 K/uL   Basophils Relative 1 %   Basophils Absolute 0.0 0 - 0.1 K/uL    Comment: Performed at [facility], [address] [place], [place] [address]  Comprehensive metabolic panel     Status: Abnormal   Collection Time: 05/10/18 10:42 AM  Result Value Ref Range   Sodium 137 135 - 145 mmol/L   Potassium 4.3 3.5 - 5.1 mmol/L   Chloride 107 98 - 111 mmol/L    Comment: Please note change in reference range.   CO2 21 (L) 22 - 32 mmol/L   Glucose, Bld 108 (H) 70 - 99 mg/dL    Comment: Please note change in reference range.   BUN 32 (H) 8 - 23 mg/dL    Comment: Please note change in reference range.   Creatinine, Ser 1.30 (H) 0.61 - 1.24 mg/dL   Calcium 9.1 8.9 - 10.3 mg/dL   Total Protein 7.3 6.5 - 8.1 g/dL   Albumin 4.1 3.5 - 5.0 g/dL   AST 25 15 - 41 U/L   ALT 19 0 - 44 U/L    Comment: Please note change in reference range.   Alkaline Phosphatase 74 38 - 126 U/L   Total Bilirubin 0.8 0.3 - 1.2 mg/dL   GFR calc non Af Amer 51 (L) >60 mL/min   GFR calc Af Amer 59 (L) >60 mL/min    Comment: (NOTE) The eGFR has been calculated using the CKD EPI  equation. This calculation has not been validated in all clinical situations. eGFR's persistently <60 mL/min signify possible Chronic Kidney Disease.    Anion gap 9 5 - 15    Comment: Performed at [facility], [address] [place], [place] [address]  Troponin I     Status: None   Collection Time: 05/10/18 10:42 AM  Result Value Ref Range   Troponin I <0.03 <0.03 ng/mL    Comment: Performed at [facility], [address] [place], [place] [address]  Hemoglobin A1c     Status: None   Collection Time: 05/10/18 11:19 AM  Result Value Ref Range   Hgb A1c MFr Bld 5.5 4.8 - 5.6 %    Comment: (NOTE) Pre diabetes:          5.7%-6.4% Diabetes:              >6.4% Glycemic control for   <7.0% adults with diabetes    Mean Plasma Glucose 111.15 mg/dL    Comment: Performed at [facility] [address], [place], [place] [address]  Urine Drug Screen, Qualitative     Status: Abnormal   Collection Time: 05/10/18 12:41 PM  Result Value Ref Range   Tricyclic, Ur Screen NONE DETECTED NONE DETECTED  Amphetamines, Ur Screen NONE DETECTED NONE DETECTED   MDMA (Ecstasy)Ur Screen NONE DETECTED NONE DETECTED   Cocaine Metabolite,Ur [place] NONE DETECTED NONE DETECTED   Opiate, Ur Screen NONE DETECTED NONE DETECTED   Phencyclidine (PCP) Ur S NONE DETECTED NONE DETECTED   Cannabinoid 50 Ng, Ur  NONE DETECTED NONE DETECTED   Barbiturates, Ur Screen (A) NONE DETECTED    Result not available. Reagent lot number recalled by manufacturer.   Benzodiazepine, Ur Scrn NONE DETECTED NONE DETECTED   Methadone Scn, Ur NONE DETECTED NONE DETECTED    Comment: (NOTE) Tricyclics + metabolites, urine    Cutoff 1000 ng/mL Amphetamines + metabolites, urine  Cutoff 1000 ng/mL MDMA (Ecstasy), urine              Cutoff 500 ng/mL Cocaine Metabolite, urine          Cutoff 300 ng/mL Opiate + metabolites, urine        Cutoff 300 ng/mL Phencyclidine (PCP), urine         Cutoff 25  ng/mL Cannabinoid, urine                 Cutoff 50 ng/mL Barbiturates + metabolites, urine  Cutoff 200 ng/mL Benzodiazepine, urine              Cutoff 200 ng/mL Methadone, urine                   Cutoff 300 ng/mL The urine drug screen provides only a preliminary, unconfirmed analytical test result and should not be used for non-medical purposes. Clinical consideration and professional judgment should be applied to any positive drug screen result due to possible interfering substances. A more specific alternate chemical method must be used in order to obtain a confirmed analytical result. Gas chromatography / mass spectrometry (GC/MS) is the preferred confirmat ory method. Performed at [facility], [address] [place], [place] [address]   Urinalysis, Routine w reflex microscopic     Status: Abnormal   Collection Time: 05/10/18 12:41 PM  Result Value Ref Range   Color, Urine YELLOW (A) YELLOW   APPearance HAZY (A) CLEAR   Specific Gravity, Urine 1.013 1.005 - 1.030   pH 5.0 5.0 - 8.0   Glucose, UA NEGATIVE NEGATIVE mg/dL   Hgb urine dipstick NEGATIVE NEGATIVE   Bilirubin Urine NEGATIVE NEGATIVE   Ketones, ur NEGATIVE NEGATIVE mg/dL   Protein, ur 30 (A) NEGATIVE mg/dL   Nitrite NEGATIVE NEGATIVE   Leukocytes, UA NEGATIVE NEGATIVE   RBC / HPF 0-5 0 - 5 RBC/hpf   WBC, UA 0-5 0 - 5 WBC/hpf   Bacteria, UA NONE SEEN NONE SEEN   Squamous Epithelial / LPF NONE SEEN 0 - 5   Mucus PRESENT    Hyaline Casts, UA PRESENT     Comment: Performed at [facility], [address] [place],  [address]  Glucose, capillary     Status: None   Collection Time: 05/10/18  4:53 PM  Result Value Ref Range   Glucose-Capillary 98 70 - 99 mg/dL  Glucose, capillary     Status: None   Collection Time: 05/10/18  9:30 PM  Result Value Ref Range   Glucose-Capillary 99 70 - 99 mg/dL  Lipid panel     Status: Abnormal   Collection Time: 05/11/18  5:49 AM  Result Value Ref Range    Cholesterol 160 0 - 200 mg/dL   Triglycerides 192 (H) <150 mg/dL   HDL 46 >40 mg/dL  Total CHOL/HDL Ratio 3.5 RATIO   VLDL 38 0 - 40 mg/dL   LDL Cholesterol 76 0 - 99 mg/dL    Comment:        Total Cholesterol/HDL:CHD Risk Coronary Heart Disease Risk Table                     Men   Women  1/2 Average Risk   3.4   3.3  Average Risk       5.0   4.4  2 X Average Risk   9.6   7.1  3 X Average Risk  23.4   11.0        Use the calculated Patient Ratio above and the CHD Risk Table to determine the patient's CHD Risk.        ATP III CLASSIFICATION (LDL):  <100     mg/dL   Optimal  100-129  mg/dL   Near or Above                    Optimal  130-159  mg/dL   Borderline  160-189  mg/dL   High  >190     mg/dL   Very High Performed at [facility], [place], [place] [address]   Glucose, capillary     Status: Abnormal   Collection Time: 05/11/18  7:41 AM  Result Value Ref Range   Glucose-Capillary 102 (H) 70 - 99 mg/dL  Glucose, capillary     Status: Abnormal   Collection Time: 05/11/18 11:41 AM  Result Value Ref Range   Glucose-Capillary 107 (H) 70 - 99 mg/dL    Radiology Ct Angio Head W Or Wo Contrast  Result Date: 05/10/2018 CLINICAL DATA:  Left arm weakness beginning 0400 hours EXAM: CT ANGIOGRAPHY HEAD AND NECK TECHNIQUE: Multidetector CT imaging of the head and neck was performed using the standard protocol during bolus administration of intravenous contrast. Multiplanar CT image reconstructions and MIPs were obtained to evaluate the vascular anatomy. Carotid stenosis measurements (when applicable) are obtained utilizing NASCET criteria, using the distal internal carotid diameter as the denominator. CONTRAST:  60m OMNIPAQUE IOHEXOL 350 MG/ML SOLN COMPARISON:  Head CT same day. FINDINGS: CTA NECK FINDINGS Aortic arch: Aortic atherosclerosis without aneurysm or dissection. Branching pattern of the brachiocephalic vessels is normal without origin stenosis.  Right carotid system: Common carotid artery is tortuous but widely patent to the bifurcation region. There is soft and calcified plaque at the carotid bifurcation. Minimal diameter of the proximal ICA is 4 mm. Compared to a more distal cervical ICA diameter of 5 mm, this indicates a 20% stenosis. The distal cervical internal carotid artery beginning at about the C2-3 level is markedly abnormal. 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 Narrowing in this segment is estimated at 30-50%. Left carotid system: Common carotid artery is tortuous but sufficiently patent to the bifurcation. At the carotid bifurcation, there is atherosclerotic disease with soft and calcified plaque. Minimal diameter of the proximal ICA is 3.5 mm. Compared to a more distal cervical ICA diameter of 5 mm, this indicates a 20% stenosis. Similarly on this side, the upper cervical internal carotid artery is abnormal with calcification and irregularity. This could be due to chronic dissection or simple atherosclerosis. Minimal diameter of the vessel just proximal to the skull base is 1 mm or less.  This places the patient at risk left ICA complete occlusion. Vertebral arteries: Both vertebral artery origins show some atherosclerotic change but are sufficiently patent. Both vertebral arteries are patent through the cervical region to the foramen magnum. Skeleton: Mild cervical spondylosis. Other neck: No soft tissue mass or lymphadenopathy. Right submandibular gland is either atrophic or surgically absent. Upper chest: Lung apices are clear. Review of the MIP images confirms the above findings CTA HEAD FINDINGS Anterior circulation: Both internal carotid arteries show flow through the carotid siphon region. There is extensive siphon calcification. Stenosis in that region is  estimated at 50% on each side. Supraclinoid internal carotid arteries are widely patent. The anterior and middle cerebral vessels show flow without proximal stenosis, aneurysm or vascular malformation. No missing larger vessels are identified. Posterior circulation: Both vertebral arteries are patent at foramen magnum. There is a 70% stenosis of the right V4 segment, but the vessel does show flow distal to that to the basilar. There is a 50% stenosis of the left V4 segment, but the vessel is also patent to the basilar. No basilar stenosis. Posterior circulation branch vessels show flow. Venous sinuses: Patent and normal. Anatomic variants: None significant. Delayed phase: No abnormal enhancement. 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 20% stenosis in the ICA bulb region on each side. Abnormal distal cervical internal carotid arteries on each side. 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 There is narrowing and irregularity of the upper cervical internal carotid artery, narrowing in that region estimated at 30-50%. On the left, I do not see a pseudo aneurysm, but there is more severe narrowing and irregularity of the upper cervical ICA, with the lumen at 1 mm or smaller. Therefore, the patient is at considerable risk distal left ICA occlusion. Atherosclerotic disease in both carotid siphon regions with stenosis estimated at 50%. No intracranial occlusion or correctable stenosis identified. Stenotic disease in both V4 segments, estimated at 70% on the right and 50% on the left. Electronically Signed   By: Nelson Chimes M.D.   On: 05/10/2018 13:20   Ct Head Wo Contrast  Result Date: 05/10/2018 CLINICAL DATA:  Left arm weakness for several hours EXAM: CT HEAD WITHOUT CONTRAST TECHNIQUE: Contiguous axial images were obtained from the base of the skull through the vertex without  intravenous contrast. COMPARISON:  None. FINDINGS: Brain: No evidence of acute infarction, hemorrhage, hydrocephalus, extra-axial collection or mass lesion/mass effect. Vascular: No hyperdense vessel or unexpected calcification. Skull: Normal. Negative for fracture or focal lesion. Sinuses/Orbits: No acute finding. Other: None. IMPRESSION: No acute intracranial abnormality noted. Electronically Signed   By: Inez Catalina M.D.   On: 05/10/2018 11:27   Ct Angio Neck W Or Wo Contrast  Result Date: 05/10/2018 CLINICAL DATA:  Left arm weakness beginning 0400 hours EXAM: CT ANGIOGRAPHY HEAD AND NECK TECHNIQUE: Multidetector CT imaging of the head and neck was performed using the standard protocol during bolus administration of intravenous contrast. Multiplanar CT image reconstructions and MIPs were obtained to evaluate the vascular anatomy. Carotid stenosis measurements (when applicable) are obtained utilizing NASCET criteria, using the distal internal carotid diameter as the denominator. CONTRAST:  1m OMNIPAQUE IOHEXOL 350 MG/ML SOLN COMPARISON:  Head CT same day. FINDINGS: CTA NECK FINDINGS Aortic arch: Aortic atherosclerosis without aneurysm or dissection. Branching pattern of the brachiocephalic vessels is normal without origin stenosis. Right carotid system: Common carotid artery is tortuous but widely  patent to the bifurcation region. There is soft and calcified plaque at the carotid bifurcation. Minimal diameter of the proximal ICA is 4 mm. Compared to a more distal cervical ICA diameter of 5 mm, this indicates a 20% stenosis. The distal cervical internal carotid artery beginning at about the C2-3 level is markedly abnormal. 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 Narrowing in this segment is estimated at 30-50%. Left carotid system:  Common carotid artery is tortuous but sufficiently patent to the bifurcation. At the carotid bifurcation, there is atherosclerotic disease with soft and calcified plaque. Minimal diameter of the proximal ICA is 3.5 mm. Compared to a more distal cervical ICA diameter of 5 mm, this indicates a 20% stenosis. Similarly on this side, the upper cervical internal carotid artery is abnormal with calcification and irregularity. This could be due to chronic dissection or simple atherosclerosis. Minimal diameter of the vessel just proximal to the skull base is 1 mm or less. This places the patient at risk left ICA complete occlusion. Vertebral arteries: Both vertebral artery origins show some atherosclerotic change but are sufficiently patent. Both vertebral arteries are patent through the cervical region to the foramen magnum. Skeleton: Mild cervical spondylosis. Other neck: No soft tissue mass or lymphadenopathy. Right submandibular gland is either atrophic or surgically absent. Upper chest: Lung apices are clear. Review of the MIP images confirms the above findings CTA HEAD FINDINGS Anterior circulation: Both internal carotid arteries show flow through the carotid siphon region. There is extensive siphon calcification. Stenosis in that region is estimated at 50% on each side. Supraclinoid internal carotid arteries are widely patent. The anterior and middle cerebral vessels show flow without proximal stenosis, aneurysm or vascular malformation. No missing larger vessels are identified. Posterior circulation: Both vertebral arteries are patent at foramen magnum. There is a 70% stenosis of the right V4 segment, but the vessel does show flow distal to that to the basilar. There is a 50% stenosis of the left V4 segment, but the vessel is also patent to the basilar. No basilar stenosis. Posterior circulation branch vessels show flow. Venous sinuses: Patent and normal. Anatomic variants: None significant. Delayed phase: No  abnormal enhancement. 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 20% stenosis in the ICA bulb region on each side. Abnormal distal cervical internal carotid arteries on each side. 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 There is narrowing and irregularity of the upper cervical internal carotid artery, narrowing in that region estimated at 30-50%. On the left, I do not see a pseudo aneurysm, but there is more severe narrowing and irregularity of the upper cervical ICA, with the lumen at 1 mm or smaller. Therefore, the patient is at considerable risk distal left ICA occlusion. Atherosclerotic disease in both carotid siphon regions with stenosis estimated at 50%. No intracranial occlusion or correctable stenosis identified. Stenotic disease in both V4 segments, estimated at 70% on the right and 50% on the left. Electronically Signed   By: Nelson Chimes M.D.   On: 05/10/2018 13:20   Mr Brain Wo Contrast  Result Date: 05/11/2018 CLINICAL DATA:  82 y/o  M; left upper extremity weakness. EXAM: MRI HEAD WITHOUT CONTRAST TECHNIQUE: Multiplanar, multiecho pulse sequences of the brain and surrounding structures were obtained without intravenous contrast.  COMPARISON:  05/10/2018 CT head, CTA head, CT perfusion head. FINDINGS: Brain: Several punctate foci of reduced diffusion compatible with acute/early subacute infarction are present scattered throughout the right frontal parietal cortices, right caudate head, right temporal periventricular white matter, and the right temporal occipital junction. The distribution suggest watershed infarct. No associated hemorrhage or mass effect. Several nonspecific foci of T2 FLAIR hyperintense signal abnormality in subcortical and periventricular white matter are compatible with moderate chronic microvascular ischemic changes for age. Moderate brain  parenchymal volume loss. Small chronic lacunar infarcts within the bilateral caudate heads. No focal mass effect, extra-axial collection, hydrocephalus, or herniation. Vascular: Normal flow voids. Skull and upper cervical spine: Normal marrow signal. Sinuses/Orbits: Negative. Other: None. IMPRESSION: 1. Several punctate acute/early subacute infarctions in a right-sided probable watershed distribution. No associated hemorrhage or mass effect. 2. Moderate chronic microvascular ischemic changes and parenchymal volume loss of the brain. Electronically Signed   By: Kristine Garbe M.D.   On: 05/11/2018 00:08   Ct Cerebral Perfusion W Contrast  Result Date: 05/10/2018 CLINICAL DATA:  Left arm weakness. EXAM: CT PERFUSION BRAIN TECHNIQUE: Multiphase CT imaging of the brain was performed following IV bolus contrast injection. Subsequent parametric perfusion maps were calculated using RAPID software. CONTRAST:  76m OMNIPAQUE IOHEXOL 350 MG/ML SOLN COMPARISON:  Head CT and CTA from earlier today. FINDINGS: CT Brain Perfusion Findings: CBF (<30%) Volume: 054mPerfusion (Tmax>6.0s) volume: 36m77mSPECTS on noncontrast CT Head: 10 at 10:55 a.m. today . When perfusion thresholds are liberalized to 4 seconds, there is a nearly 21 cc area of apparent ischemia in the right frontal parietal region. IMPRESSION: 1. No infarct or penumbra by standard CT perfusion values. There is suggestion of slightly delayed perfusion in the right frontal parietal region of note given left arm symptoms. No resolved underlying vascular lesion by preceding CTA. 2. Motion degraded. Electronically Signed   By: JonMonte FantasiaD.   On: 05/10/2018 14:29    Assessment/Plan  Acute CVA [place].  Medical management recommended.  Hypertension blood pressure control important in reducing the progression of atherosclerotic disease. On appropriate oral medications.   Diabetes  mellitus without complication (HCC) blood glucose control important in reducing the progression of atherosclerotic disease. Also, involved in wound healing. On appropriate medications.   Carotid stenosis He was found to have mild cervical carotid artery stenosis with more significant disease at the base of the skull and in the intracranial circulation although still in the moderate range. Continue medical management with aspirin, Plavix, and Zocor.  No intervention of benefit currently.  Recheck in 6 months    JasLeotis PainD  05/25/2018 11:10 AM    This note was created with Dragon medical transcription system.  Any errors from dictation are purely unintentional

## 2018-05-25 NOTE — Patient Instructions (Signed)
Carotid Artery Disease The carotid arteries are arteries on both sides of the neck. They carry blood to the brain. Carotid artery disease is when the arteries get smaller (narrow) or get blocked. If these arteries get smaller or get blocked, you are more likely to have a stroke or warning stroke (transient ischemic attack). Follow these instructions at home:  Take medicines as told by your doctor. Make sure you understand all your medicine instructions. Do not stop your medicines without talking to your doctor first.  Follow your doctor's diet instructions. It is important to eat a healthy diet that includes plenty of: ? Fresh fruits. ? Vegetables. ? Lean meats.  Avoid: ? High-fat foods. ? High-sodium foods. ? Foods that are fried, overly processed, or have poor nutritional value.  Stay a healthy weight.  Stay active. Get at least 30 minutes of activity every day.  Do not smoke.  Limit alcohol use to: ? No more than 2 drinks a day for men. ? No more than 1 drink a day for women who are not pregnant.  Do not use illegal drugs.  Keep all doctor visits as told. Get help right away if:  You have sudden weakness or loss of feeling (numbness) on one side of the body, such as the face, arm, or leg.  You have sudden confusion.  You have trouble speaking (aphasia) or understanding.  You have sudden trouble seeing out of one or both eyes.  You have sudden trouble walking.  You have dizziness or feel like you might pass out (faint).  You have a loss of balance or your movements are not steady (uncoordinated).  You have a sudden, severe headache with no known cause.  You have trouble swallowing (dysphagia). Call your local emergency services (911 in [place] Do notdrive yourself to the clinic or hospital. This information is not intended to replace advice given to you by your health care provider. Make sure you discuss any questions you have with your health care  provider. Document Released: 10/17/2012 Document Revised: 04/07/2016 Document Reviewed: 05/01/2013 Elsevier Interactive Patient Education  2018 Elsevier Inc.  

## 2018-06-27 MED ORDER — CEFAZOLIN SODIUM-DEXTROSE 2-4 GM/100ML-% IV SOLN: 2.0000 g | INTRAVENOUS | Status: DC

## 2018-06-28 ENCOUNTER — Encounter: Payer: Self-pay | Admitting: Cardiology

## 2018-06-28 ENCOUNTER — Encounter
Admission: RE | Disposition: A | Discharge status: Home / Self Care | Payer: Self-pay | Source: Ambulatory Visit | Attending: Cardiology

## 2018-06-28 ENCOUNTER — Ambulatory Visit
Admission: RE | Admit: 2018-06-28 | Discharge: 2018-06-28 | Disposition: A | Discharge status: Home / Self Care | Payer: BLUE CROSS/BLUE SHIELD | Source: Ambulatory Visit | Attending: Cardiology | Admitting: Cardiology

## 2018-06-28 DIAGNOSIS — I714 Abdominal aortic aneurysm, without rupture: Secondary | ICD-10-CM | POA: Diagnosis not present

## 2018-06-28 DIAGNOSIS — Z8673 Personal history of transient ischemic attack (TIA), and cerebral infarction without residual deficits: Secondary | ICD-10-CM | POA: Diagnosis not present

## 2018-06-28 DIAGNOSIS — Z87891 Personal history of nicotine dependence: Secondary | ICD-10-CM | POA: Diagnosis not present

## 2018-06-28 DIAGNOSIS — Z823 Family history of stroke: Secondary | ICD-10-CM | POA: Diagnosis not present

## 2018-06-28 DIAGNOSIS — Z7902 Long term (current) use of antithrombotics/antiplatelets: Secondary | ICD-10-CM | POA: Diagnosis not present

## 2018-06-28 DIAGNOSIS — E785 Hyperlipidemia, unspecified: Secondary | ICD-10-CM | POA: Insufficient documentation

## 2018-06-28 DIAGNOSIS — I2510 Atherosclerotic heart disease of native coronary artery without angina pectoris: Secondary | ICD-10-CM | POA: Diagnosis present

## 2018-06-28 DIAGNOSIS — I4891 Unspecified atrial fibrillation: Secondary | ICD-10-CM

## 2018-06-28 DIAGNOSIS — Z85820 Personal history of malignant melanoma of skin: Secondary | ICD-10-CM | POA: Insufficient documentation

## 2018-06-28 DIAGNOSIS — Z951 Presence of aortocoronary bypass graft: Secondary | ICD-10-CM | POA: Insufficient documentation

## 2018-06-28 DIAGNOSIS — I701 Atherosclerosis of renal artery: Secondary | ICD-10-CM | POA: Insufficient documentation

## 2018-06-28 DIAGNOSIS — Z79899 Other long term (current) drug therapy: Secondary | ICD-10-CM | POA: Diagnosis not present

## 2018-06-28 DIAGNOSIS — E1151 Type 2 diabetes mellitus with diabetic peripheral angiopathy without gangrene: Secondary | ICD-10-CM | POA: Diagnosis not present

## 2018-06-28 DIAGNOSIS — Z7982 Long term (current) use of aspirin: Secondary | ICD-10-CM | POA: Diagnosis not present

## 2018-06-28 DIAGNOSIS — I10 Essential (primary) hypertension: Secondary | ICD-10-CM | POA: Diagnosis not present

## 2018-06-28 DIAGNOSIS — J449 Chronic obstructive pulmonary disease, unspecified: Secondary | ICD-10-CM | POA: Insufficient documentation

## 2018-06-28 HISTORY — PX: LOOP RECORDER INSERTION: EP1214

## 2018-06-28 SURGERY — LOOP RECORDER INSERTION
Anesthesia: LOCAL

## 2018-06-28 MED ORDER — LIDOCAINE-EPINEPHRINE (PF) 1 %-1:200000 IJ SOLN
INTRAMUSCULAR | Status: AC
  Filled 2018-06-28: qty 30

## 2018-06-28 MED ORDER — SODIUM CHLORIDE 0.9 % IV SOLN: 80.0000 mg | INTRAVENOUS | Status: DC

## 2018-06-28 SURGICAL SUPPLY — 2 items
LOOP REVEAL LINQSYS (Prosthesis & Implant Heart) ×2 IMPLANT
PACK LOOP INSERTION (CUSTOM PROCEDURE TRAY) ×2 IMPLANT

## 2018-06-28 NOTE — Discharge Instructions (Signed)
Implantable Loop Recorder Placement, Care After Call for appointment for one week with Dr Ubaldo Glassing. Leave dressing on for two day. Leave steristrips (tape strips) on until fall off on own.   Refer to this sheet in the next few weeks. These instructions provide you with information about caring for yourself after your procedure. Your health care provider may also give you more specific instructions. Your treatment has been planned according to current medical practices, but problems sometimes occur. Call your health care provider if you have any problems or questions after your procedure. What can I expect after the procedure? After the procedure, it is common to have:  Soreness or pain near the cut from surgery (incision).  Some swelling or bruising near the incision.  Follow these instructions at home: Medicines  Take over-the-counter and prescription medicines only as told by your health care provider.  If you were prescribed an antibiotic medicine, take it as told by your health care provider. Do not stop taking the antibiotic even if you start to feel better. Bathing  Do not take baths, swim, or use a hot tub until your health care provider approves. Ask your health care provider if you can take showers. You may only be allowed to take sponge baths for bathing. Incision care  Follow instructions from your health care provider about how to take care of your incision. Make sure you: ? Wash your hands with soap and water before you change your bandage (dressing). If soap and water are not available, use hand sanitizer. ? Change your dressing as told by your health care provider. ? Keep your dressing dry. ? Leave stitches (sutures), skin glue, or adhesive strips in place. These skin closures may need to stay in place for 2 weeks or longer. If adhesive strip edges start to loosen and curl up, you may trim the loose edges. Do not remove adhesive strips completely unless your health care provider  tells you to do that.  Check your incision area every day for signs of infection. Check for: ? More redness, swelling, or pain. ? Fluid or blood. ? Warmth. ? Pus or a bad smell. Driving  If you received a sedative, do not drive for 24 hours after the procedure.  If you did not receive a sedative, ask your health care provider when it is safe to drive. Activity  Return to your normal activities as told by your health care provider. Ask your health care provider what activities are safe for you.  Until your health care provider says it is safe: ? Do not lift anything that is heavier than 10 lb (4.5 kg). ? Do not do activities that involve lifting your arms over your head. General instructions   Follow instructions from your health care provider about how and when to use your implantable loop recorder.  Do not go through a metal detection gate, and do not let someone hold a metal detector over your chest. Show your ID card.  Do not have an MRI unless you check with your health care provider first.  Do not use any tobacco products, such as cigarettes, chewing tobacco, and e-cigarettes. Tobacco can delay healing. If you need help quitting, ask your health care provider.  Keep all follow-up visits as told by your health care provider. This is important. Contact a health care provider if:  You have more redness, swelling, or pain around your incision.  You have more fluid or blood coming from your incision.  Your incision feels  warm to the touch.  You have pus or a bad smell coming from your incision.  You have a fever.  You have pain that is not relieved by your pain medicine.  You have triggered your device because of fainting (syncope) or because of a heartbeat that feels like it is racing, slow, fluttering, or skipping (palpitations). Get help right away if:  You have chest pain.  You have difficulty breathing. This information is not intended to replace advice given  to you by your health care provider. Make sure you discuss any questions you have with your health care provider. Document Released: 10/12/2015 Document Revised: 04/07/2016 Document Reviewed: 08/05/2015 Elsevier Interactive Patient Education  [employer].

## 2018-12-07 ENCOUNTER — Encounter (INDEPENDENT_AMBULATORY_CARE_PROVIDER_SITE_OTHER): Payer: Self-pay | Admitting: Vascular Surgery

## 2018-12-07 ENCOUNTER — Ambulatory Visit (INDEPENDENT_AMBULATORY_CARE_PROVIDER_SITE_OTHER): Payer: BLUE CROSS/BLUE SHIELD | Admitting: Vascular Surgery

## 2018-12-07 ENCOUNTER — Ambulatory Visit (INDEPENDENT_AMBULATORY_CARE_PROVIDER_SITE_OTHER): Payer: BLUE CROSS/BLUE SHIELD

## 2018-12-07 VITALS — BP 143/76 | HR 62 | Resp 16 | Ht 70.0 in | Wt 188.2 lb

## 2018-12-07 DIAGNOSIS — I639 Cerebral infarction, unspecified: Secondary | ICD-10-CM | POA: Diagnosis not present

## 2018-12-07 DIAGNOSIS — I6523 Occlusion and stenosis of bilateral carotid arteries: Secondary | ICD-10-CM

## 2018-12-07 DIAGNOSIS — I10 Essential (primary) hypertension: Secondary | ICD-10-CM | POA: Diagnosis not present

## 2018-12-07 DIAGNOSIS — E119 Type 2 diabetes mellitus without complications: Secondary | ICD-10-CM

## 2018-12-07 NOTE — Assessment & Plan Note (Signed)
Carotid duplex today shows stable 1 to 39% carotid artery stenosis bilaterally without significant progression from previous evaluations.  No role for intervention at this level.  Continue aspirin, Plavix, and statin agent.  Recheck in 1 year.

## 2018-12-07 NOTE — Progress Notes (Signed)
MRN : [id number]  Nathaniel Gamble is a 83 y.o. ([birth year]) male who presents with chief complaint of  Chief Complaint  Patient presents with  . Follow-up  .  History of Present Illness: Patient returns in follow-up of his carotid disease.  He has recovered well from the stroke he had last year and has only minimal residual deficits.  No new focal neurologic symptoms. Specifically, the patient denies amaurosis fugax, speech or swallowing difficulties, or arm or leg weakness or numbness.  Carotid duplex today shows stable 1 to 39% carotid artery stenosis bilaterally without significant progression from previous evaluations.  Current Outpatient Medications  Medication Sig Dispense Refill  . amLODipine (NORVASC) 10 MG tablet Take 10 mg by mouth daily.    [name] aspirin EC 81 MG tablet Take 81 mg by mouth daily.    . carvedilol (COREG) 25 MG tablet Take 25 mg by mouth 2 (two) times daily with a meal.    . clopidogrel (PLAVIX) 75 MG tablet Take 1 tablet (75 mg total) by mouth daily. 30 tablet 11  . losartan (COZAAR) 100 MG tablet Take 100 mg by mouth daily.     . simvastatin (ZOCOR) 40 MG tablet Take 40 mg by mouth at bedtime.    . Vitamin D, Ergocalciferol, (DRISDOL) 50000 units CAPS capsule Take 50,000 Units by mouth every 30 (thirty) days.   1   No current facility-administered medications for this visit.     Past Medical History:  Diagnosis Date  . Abdominal aortic aneurysm ([place])    monitored by dr. Ubaldo Glassing  . Abdominal aortic aneurysm ([place])   . Cancer ([place]) 1976   melanoma, under right arm, removed  . Chronic kidney disease 2011   stent right kidney  . Coronary artery disease   . Diabetes mellitus without complication ([place])   . Hypertension   . Jaw fracture ([place])   . Myocardial infarction ([place])   . Peripheral vascular disease ([place])   . PONV (postoperative nausea and vomiting)   . Renal artery stenosis [facility])     Past Surgical History:  Procedure Laterality Date  . CATARACT  EXTRACTION Bilateral 2014  . CORONARY ARTERY BYPASS GRAFT  2010  . INGUINAL HERNIA REPAIR Left 07/09/2015   Procedure: HERNIA REPAIR INGUINAL ADULT;  Surgeon: Leonie Green, MD;  Location: [facility] ORS;  Service: General;  Laterality: Left;  . LOOP RECORDER INSERTION N/A 06/28/2018   Procedure: LOOP RECORDER INSERTION;  Surgeon: Isaias Cowman, MD;  Location: [facility];  Service: Cardiovascular;  Laterality: N/A;  . MANDIBLE FRACTURE SURGERY Left   . RENAL ARTERY STENT Right    Social History        Tobacco Use  . Smoking status: Former Research scientist (life sciences)  . Smokeless tobacco: Former Network engineer Use Topics  . Alcohol use: Yes    Alcohol/week: 0.6 oz    Types: 1 Glasses of wine per week  . Drug use: No    Family History No bleeding disorders, clotting disorders, aneurysms, or autoimmune diseases      Allergies  Allergen Reactions  . Lisinopril Swelling  . Tetanus Toxoids Swelling     REVIEW OF SYSTEMS(Negative unless checked)  Constitutional: [] ?Weight loss[] ?Fever[] ?Chills Cardiac:[] ?Chest pain[] ?Chest pressure[x] ?Palpitations [] ?Shortness of breath when laying flat [] ?Shortness of breath at rest [x] ?Shortness of breath with exertion. Vascular: [] ?Pain in legs with walking[] ?Pain in legsat rest[] ?Pain in legs when laying flat [] ?Claudication [] ?Pain in feet when walking [] ?Pain in feet at rest [] ?Pain in feet when  laying flat [] ?History of DVT [] ?Phlebitis [] ?Swelling in legs [] ?Varicose veins [] ?Non-healing ulcers Pulmonary: [] ?Uses home oxygen [] ?Productive cough[] ?Hemoptysis [] ?Wheeze [] ?COPD [] ?Asthma Neurologic: [] ?Dizziness [] ?Blackouts [] ?Seizures [x] ?History of stroke [x] ?History of TIA[] ?Aphasia [] ?Temporary blindness[] ?Dysphagia [] ?Weaknessor numbness in arms [] ?Weakness or numbnessin legs Musculoskeletal: [x] ?Arthritis [] ?Joint swelling [x] ?Joint pain [] ?Low  back pain Hematologic:[] ?Easy bruising[] ?Easy bleeding [] ?Hypercoagulable state [] ?Anemic [] ?Hepatitis Gastrointestinal:[] ?Blood in stool[] ?Vomiting blood[x] ?Gastroesophageal reflux/heartburn[] ?Difficulty swallowing. Genitourinary: [] ?Chronic kidney disease [] ?Difficulturination [] ?Frequenturination [] ?Burning with urination[] ?Blood in urine Skin: [] ?Rashes [] ?Ulcers [] ?Wounds Psychological: [] ?History of anxiety[] ?History of major depression.    Physical Examination  Vitals:   12/07/18 0906  BP: (!) 143/76  Pulse: 62  Resp: 16  Weight: 188 lb 3.2 oz (85.4 kg)  Height: 5\' 10"  (1.778 m)   Body mass index is 27 kg/m. Gen:  WD/WN, NAD. Appears younger than stated age. Head: [place]/AT, No temporalis wasting. Ear/Nose/Throat: Hearing grossly intact, nares w/o erythema or drainage, trachea midline Eyes: Conjunctiva clear. Sclera non-icteric Neck: Supple.  No bruit  Pulmonary:  Good air movement, equal and clear to auscultation bilaterally.  Cardiac: RRR, No JVD Vascular:  Vessel Right Left  Radial Palpable Palpable               Musculoskeletal: M/S 5/5 throughout.  No deformity or atrophy. No edema. Neurologic: CN 2-12 intact. Sensation grossly intact in extremities.  Symmetrical.  Speech is fluent. Motor exam as listed above. Psychiatric: Judgment intact, Mood & affect appropriate for pt's clinical situation. Dermatologic: No rashes or ulcers noted.  No cellulitis or open wounds.      CBC Lab Results  Component Value Date   WBC 7.1 05/10/2018   HGB 14.8 05/10/2018   HCT 43.7 05/10/2018   MCV 91.1 05/10/2018   PLT 138 (L) 05/10/2018    BMET    Component Value Date/Time   NA 137 05/10/2018 1042   K 4.3 05/10/2018 1042   CL 107 05/10/2018 1042   CO2 21 (L) 05/10/2018 1042   GLUCOSE 108 (H) 05/10/2018 1042   BUN 32 (H) 05/10/2018 1042   CREATININE 1.30 (H) 05/10/2018 1042   CALCIUM 9.1 05/10/2018 1042   GFRNONAA 51 (L)  05/10/2018 1042   GFRAA 59 (L) 05/10/2018 1042   CrCl cannot be calculated (Patient's most recent lab result is older than the maximum 21 days allowed.).  COAG Lab Results  Component Value Date   INR 1.05 05/10/2018    Radiology No results found.    Assessment/Plan Acute CVA (cerebrovascular accident) ([place]) Last year. Intracranial disease predominantly.  Medical management recommended.  Hypertension blood pressure control important in reducing the progression of atherosclerotic disease. On appropriate oral medications.   Diabetes mellitus without complication (HCC) blood glucose control important in reducing the progression of atherosclerotic disease. Also, involved in wound healing. On appropriate medications.  Carotid stenosis Carotid duplex today shows stable 1 to 39% carotid artery stenosis bilaterally without significant progression from previous evaluations.  No role for intervention at this level.  Continue aspirin, Plavix, and statin agent.  Recheck in 1 year.    Leotis Pain, MD  12/07/2018 9:47 AM    This note was created with Dragon medical transcription system.  Any errors from dictation are purely unintentional

## 2019-06-02 ENCOUNTER — Emergency Department: Payer: BC Managed Care – PPO

## 2019-06-02 ENCOUNTER — Other Ambulatory Visit: Payer: Self-pay

## 2019-06-02 ENCOUNTER — Emergency Department
Admission: EM | Admit: 2019-06-02 | Discharge: 2019-06-03 | Disposition: A | Discharge status: Home / Self Care | Payer: BC Managed Care – PPO | Attending: Emergency Medicine | Admitting: Emergency Medicine

## 2019-06-02 DIAGNOSIS — Z951 Presence of aortocoronary bypass graft: Secondary | ICD-10-CM | POA: Diagnosis not present

## 2019-06-02 DIAGNOSIS — Y999 Unspecified external cause status: Secondary | ICD-10-CM | POA: Diagnosis not present

## 2019-06-02 DIAGNOSIS — Y929 Unspecified place or not applicable: Secondary | ICD-10-CM | POA: Insufficient documentation

## 2019-06-02 DIAGNOSIS — S0091XA Abrasion of unspecified part of head, initial encounter: Secondary | ICD-10-CM

## 2019-06-02 DIAGNOSIS — Y9301 Activity, walking, marching and hiking: Secondary | ICD-10-CM | POA: Insufficient documentation

## 2019-06-02 DIAGNOSIS — I129 Hypertensive chronic kidney disease with stage 1 through stage 4 chronic kidney disease, or unspecified chronic kidney disease: Secondary | ICD-10-CM | POA: Diagnosis not present

## 2019-06-02 DIAGNOSIS — Z7902 Long term (current) use of antithrombotics/antiplatelets: Secondary | ICD-10-CM | POA: Diagnosis not present

## 2019-06-02 DIAGNOSIS — I2510 Atherosclerotic heart disease of native coronary artery without angina pectoris: Secondary | ICD-10-CM | POA: Insufficient documentation

## 2019-06-02 DIAGNOSIS — S0001XA Abrasion of scalp, initial encounter: Secondary | ICD-10-CM | POA: Insufficient documentation

## 2019-06-02 DIAGNOSIS — E1122 Type 2 diabetes mellitus with diabetic chronic kidney disease: Secondary | ICD-10-CM | POA: Diagnosis not present

## 2019-06-02 DIAGNOSIS — W19XXXA Unspecified fall, initial encounter: Secondary | ICD-10-CM

## 2019-06-02 DIAGNOSIS — Z95818 Presence of other cardiac implants and grafts: Secondary | ICD-10-CM | POA: Insufficient documentation

## 2019-06-02 DIAGNOSIS — W109XXA Fall (on) (from) unspecified stairs and steps, initial encounter: Secondary | ICD-10-CM | POA: Diagnosis not present

## 2019-06-02 DIAGNOSIS — Z79899 Other long term (current) drug therapy: Secondary | ICD-10-CM | POA: Insufficient documentation

## 2019-06-02 DIAGNOSIS — Z87891 Personal history of nicotine dependence: Secondary | ICD-10-CM | POA: Insufficient documentation

## 2019-06-02 DIAGNOSIS — Z7982 Long term (current) use of aspirin: Secondary | ICD-10-CM | POA: Insufficient documentation

## 2019-06-02 DIAGNOSIS — C439 Malignant melanoma of skin, unspecified: Secondary | ICD-10-CM | POA: Insufficient documentation

## 2019-06-02 DIAGNOSIS — M25512 Pain in left shoulder: Secondary | ICD-10-CM | POA: Diagnosis not present

## 2019-06-02 DIAGNOSIS — N189 Chronic kidney disease, unspecified: Secondary | ICD-10-CM | POA: Insufficient documentation

## 2019-06-02 DIAGNOSIS — S0990XA Unspecified injury of head, initial encounter: Secondary | ICD-10-CM | POA: Diagnosis present

## 2019-06-02 LAB — CBC WITH DIFFERENTIAL/PLATELET
Abs Immature Granulocytes: 0.09 10*3/uL — ABNORMAL HIGH (ref 0.00–0.07)
Basophils Absolute: 0.1 10*3/uL (ref 0.0–0.1)
Basophils Relative: 1 %
Eosinophils Absolute: 0.1 10*3/uL (ref 0.0–0.5)
Eosinophils Relative: 1 %
HCT: 41.6 % (ref 39.0–52.0)
Hemoglobin: 14 g/dL (ref 13.0–17.0)
Immature Granulocytes: 1 %
Lymphocytes Relative: 16 %
Lymphs Abs: 2 10*3/uL (ref 0.7–4.0)
MCH: 30.3 pg (ref 26.0–34.0)
MCHC: 33.7 g/dL (ref 30.0–36.0)
MCV: 90 fL (ref 80.0–100.0)
Monocytes Absolute: 0.8 10*3/uL (ref 0.1–1.0)
Monocytes Relative: 7 %
Neutro Abs: 9.5 10*3/uL — ABNORMAL HIGH (ref 1.7–7.7)
Neutrophils Relative %: 74 %
Platelets: 117 10*3/uL — ABNORMAL LOW (ref 150–400)
RBC: 4.62 MIL/uL (ref 4.22–5.81)
RDW: 14.3 % (ref 11.5–15.5)
WBC: 12.5 10*3/uL — ABNORMAL HIGH (ref 4.0–10.5)
nRBC: 0 % (ref 0.0–0.2)

## 2019-06-02 LAB — BASIC METABOLIC PANEL
Anion gap: 11 (ref 5–15)
BUN: 41 mg/dL — ABNORMAL HIGH (ref 8–23)
CO2: 20 mmol/L — ABNORMAL LOW (ref 22–32)
Calcium: 8.7 mg/dL — ABNORMAL LOW (ref 8.9–10.3)
Chloride: 103 mmol/L (ref 98–111)
Creatinine, Ser: 1.58 mg/dL — ABNORMAL HIGH (ref 0.61–1.24)
GFR calc Af Amer: 47 mL/min — ABNORMAL LOW (ref >60)
GFR calc non Af Amer: 41 mL/min — ABNORMAL LOW (ref >60)
Glucose, Bld: 150 mg/dL — ABNORMAL HIGH (ref 70–99)
Potassium: 4.2 mmol/L (ref 3.5–5.1)
Sodium: 134 mmol/L — ABNORMAL LOW (ref 135–145)

## 2019-06-02 MED ORDER — FENTANYL CITRATE (PF) 100 MCG/2ML IJ SOLN
50.0000 ug | Freq: Once | INTRAMUSCULAR | Status: AC
  Administered 2019-06-02: 50 ug via INTRAVENOUS
  Filled 2019-06-02: qty 2

## 2019-06-02 MED ORDER — OXYCODONE HCL 5 MG PO TABS: 2.5000 mg | ORAL_TABLET | Freq: Three times a day (TID) | ORAL | 0 refills | Status: AC | PRN

## 2019-06-02 NOTE — Discharge Instructions (Signed)
You had no evidence of bleeding on your CT scan.  You had no pain on the ribs so I think it is unlikely you have rib fracture.   Take tylenol for pain and use the ocxycodone for break through pain. Use before sleep so you do not fall.    Return to Ed for shortness of breath.    IMPRESSION: 1. No acute displaced fracture or dislocation involving the left shoulder. 2. Irregular appearance of the left third and fourth ribs. Correlation with point tenderness is recommended. If there is clinical concern for an acute left-sided rib fracture, follow-up with dedicated rib radiographs is recommended.

## 2019-06-02 NOTE — ED Notes (Signed)
Pt ambulated with pulse ox. Pt maintained Sat of 97-98% while ambulating.

## 2019-06-02 NOTE — ED Provider Notes (Signed)
[facility] Emergency Department Provider Note  ____________________________________________   First MD Initiated Contact with Patient 06/02/19 2039     (approximate)  I have reviewed the triage vital signs and the nursing notes.   HISTORY  Chief Complaint Fall    HPI Nathaniel Gamble is a 83 y.o. male with a AAA that is being closely monitored on Plavix and aspirin who presents for fall.  Patient said he was walking when he thinks he missed a step and fell over.  He did not lose consciousness.  Denying chest pain or shortness of breath prior to the fall.  Patient says that he fell onto his back and hit the back of his head.  Patient has an abrasion on his head.  He endorses a little bit of pain in his left shoulder as well.  He is having some  headaches that are moderate, constant, started after the fall.  Has not take anything to help.  Denies any abdominal pain.          Past Medical History:  Diagnosis Date   Abdominal aortic aneurysm ([place])    monitored by dr. Ubaldo Glassing   Abdominal aortic aneurysm ([place])    Cancer ([place]) 1976   melanoma, under right arm, removed   Chronic kidney disease 2011   stent right kidney   Coronary artery disease    Diabetes mellitus without complication ([place])    Hypertension    Jaw fracture ([place])    Myocardial infarction ([place])    Peripheral vascular disease ([place])    PONV (postoperative nausea and vomiting)    Renal artery stenosis [facility])     Patient Active Problem List   Diagnosis Date Noted   Hypertension 05/25/2018   Diabetes mellitus without complication ([place]) 89/16/9450   Carotid stenosis 05/25/2018   Acute CVA (cerebrovascular accident) ([place]) 05/10/2018    Past Surgical History:  Procedure Laterality Date   CATARACT EXTRACTION Bilateral 2014   CORONARY ARTERY BYPASS GRAFT  2010   INGUINAL HERNIA REPAIR Left 07/09/2015   Procedure: HERNIA REPAIR INGUINAL ADULT;  Surgeon: Leonie Green,  MD;  Location: [facility] ORS;  Service: General;  Laterality: Left;   LOOP RECORDER INSERTION N/A 06/28/2018   Procedure: LOOP RECORDER INSERTION;  Surgeon: Isaias Cowman, MD;  Location: [place] [facility];  Service: Cardiovascular;  Laterality: N/A;   MANDIBLE FRACTURE SURGERY Left    RENAL ARTERY STENT Right     Prior to Admission medications   Medication Sig Start Date End Date Taking? Authorizing Provider  amLODipine (NORVASC) 10 MG tablet Take 10 mg by mouth daily.    [provider]  aspirin EC 81 MG tablet Take 81 mg by mouth daily.    [provider]  carvedilol (COREG) 25 MG tablet Take 25 mg by mouth 2 (two) times daily with a meal.    [provider]  losartan (COZAAR) 100 MG tablet Take 100 mg by mouth daily.  11/26/18   [provider]  simvastatin (ZOCOR) 40 MG tablet Take 40 mg by mouth at bedtime.    [provider]  Vitamin D, Ergocalciferol, (DRISDOL) 50000 units CAPS capsule Take 50,000 Units by mouth every 30 (thirty) days.  05/19/18   [provider]    Allergies Lisinopril and Tetanus toxoids  No family history on file.  Social History Social History   Tobacco Use   Smoking status: Former Smoker   Smokeless tobacco: Former Building surveyor Topics  Alcohol use: Yes    Alcohol/week: 1.0 standard drinks    Types: 1 Glasses of wine per week   Drug use: No      Review of Systems Constitutional: No fever/chills Eyes: No visual changes. ENT: No sore throat. Cardiovascular: Denies chest pain. Respiratory: Denies shortness of breath. Gastrointestinal: No abdominal pain.  No nausea, no vomiting.  No diarrhea.  No constipation. Genitourinary: Negative for dysuria. Musculoskeletal: Negative for back pain. Skin: Negative for rash. Neurological:  focal weakness or numbness.  Positive for fall positive for headache All other ROS negative ____________________________________________   PHYSICAL  EXAM:  VITAL SIGNS: ED Triage Vitals [06/02/19 2037]  Enc Vitals Group     BP (!) 156/65     Pulse Rate 63     Resp 20     Temp      Temp src      SpO2 98 %     Weight 181 lb (82.1 kg)     Height 5\' 11"  (1.803 m)     Head Circumference      Peak Flow      Pain Score 6     Pain Loc      Pain Edu?      Excl. in [place]?     Constitutional: Alert and oriented. Well appearing and in no acute distress. Eyes: Conjunctivae are normal. EOMI. Head: Atraumatic.  Abrasions on the top of his head Nose: No congestion/rhinnorhea. Mouth/Throat: Mucous membranes are moist.   Neck: No stridor. Trachea Midline. FROM.  No C-spine tenderness Cardiovascular: Normal rate, regular rhythm. Grossly normal heart sounds.  Good peripheral circulation. Respiratory: Normal respiratory effort.  No retractions. Lungs CTAB. Gastrointestinal: Soft and nontender. No distention. No abdominal bruits.  Musculoskeletal: No lower extremity tenderness nor edema.  No joint effusions.  Tenderness on the left shoulder otherwise no tenderness on palpation of the rest of his extremities. Neurologic:  Normal speech and language. No gross focal neurologic deficits are appreciated.  Skin:  Skin is warm, dry and intact. No rash noted. Psychiatric: Mood and affect are normal. Speech and behavior are normal. GU: Deferred  No other back tenderness ____________________________________________   LABS (all labs ordered are listed, but only abnormal results are displayed)  Labs Reviewed  CBC WITH DIFFERENTIAL/PLATELET  BASIC METABOLIC PANEL   ____________________________________________   ED ECG REPORT I, Vanessa Newell, the attending physician, personally viewed and interpreted this ECG.  EKG is sinus rate of 68, no ST elevation, no T wave inversion, normal intervals. ____________________________________________  RADIOLOGY  Official radiology report(s): Ct Head Wo Contrast  Result Date: 06/02/2019 CLINICAL DATA:   83 year old male with fall and trauma to the head. EXAM: CT HEAD WITHOUT CONTRAST CT MAXILLOFACIAL WITHOUT CONTRAST CT CERVICAL SPINE WITHOUT CONTRAST TECHNIQUE: Multidetector CT imaging of the head, cervical spine, and maxillofacial structures were performed using the standard protocol without intravenous contrast. Multiplanar CT image reconstructions of the cervical spine and maxillofacial structures were also generated. COMPARISON:  Brain MRI dated 05/10/2018 and head CT dated 05/10/2018 FINDINGS: CT HEAD FINDINGS Brain: There is moderate age-related atrophy and chronic microvascular ischemic changes. An area of old infarct noted in the right posterior frontal convexity. There is no acute intracranial hemorrhage. No mass effect or midline shift. No extra-axial fluid collection. Vascular: No hyperdense vessel or unexpected calcification. Skull: Normal. Negative for fracture or focal lesion. Other: Laceration of the forehead with a small hematoma. CT MAXILLOFACIAL FINDINGS Osseous: No fracture or mandibular dislocation. No destructive process. Orbits:  Negative. No traumatic or inflammatory finding. Sinuses: There is chronic partial opacification of the sphenoid sinus. The remainder of the visualized paranasal sinuses and mastoid air cells are clear. Soft tissues: Negative. CT CERVICAL SPINE FINDINGS Alignment: No acute subluxation. Skull base and vertebrae: No acute cervical spine fractures. Soft tissues and spinal canal: No prevertebral fluid or swelling. No visible canal hematoma. Disc levels: Multilevel degenerative changes most prominent at C5-C6 and C6-C7. Upper chest: Negative. Other: Faint linear lucency through the left first rib adjacent to the costovertebral junction may represent a nondisplaced fracture (series 10, image 73 and coronal series 9, image 23 clinical correlation is recommended. IMPRESSION: 1. No acute intracranial hemorrhage. 2. Age-related atrophy and chronic microvascular ischemic changes.  Old right posterior frontal convexity infarct. 3. No acute/traumatic cervical spine pathology. 4. No acute facial bone fractures. 5. Faint linear lucency through the left first rib adjacent to the costovertebral junction may represent a nondisplaced fracture. Clinical correlation is recommended. Electronically Signed   By: Anner Crete M.D.   On: 06/02/2019 22:32   Ct Cervical Spine Wo Contrast  Result Date: 06/02/2019 CLINICAL DATA:  83 year old male with fall and trauma to the head. EXAM: CT HEAD WITHOUT CONTRAST CT MAXILLOFACIAL WITHOUT CONTRAST CT CERVICAL SPINE WITHOUT CONTRAST TECHNIQUE: Multidetector CT imaging of the head, cervical spine, and maxillofacial structures were performed using the standard protocol without intravenous contrast. Multiplanar CT image reconstructions of the cervical spine and maxillofacial structures were also generated. COMPARISON:  Brain MRI dated 05/10/2018 and head CT dated 05/10/2018 FINDINGS: CT HEAD FINDINGS Brain: There is moderate age-related atrophy and chronic microvascular ischemic changes. An area of old infarct noted in the right posterior frontal convexity. There is no acute intracranial hemorrhage. No mass effect or midline shift. No extra-axial fluid collection. Vascular: No hyperdense vessel or unexpected calcification. Skull: Normal. Negative for fracture or focal lesion. Other: Laceration of the forehead with a small hematoma. CT MAXILLOFACIAL FINDINGS Osseous: No fracture or mandibular dislocation. No destructive process. Orbits: Negative. No traumatic or inflammatory finding. Sinuses: There is chronic partial opacification of the sphenoid sinus. The remainder of the visualized paranasal sinuses and mastoid air cells are clear. Soft tissues: Negative. CT CERVICAL SPINE FINDINGS Alignment: No acute subluxation. Skull base and vertebrae: No acute cervical spine fractures. Soft tissues and spinal canal: No prevertebral fluid or swelling. No visible canal  hematoma. Disc levels: Multilevel degenerative changes most prominent at C5-C6 and C6-C7. Upper chest: Negative. Other: Faint linear lucency through the left first rib adjacent to the costovertebral junction may represent a nondisplaced fracture (series 10, image 73 and coronal series 9, image 23 clinical correlation is recommended. IMPRESSION: 1. No acute intracranial hemorrhage. 2. Age-related atrophy and chronic microvascular ischemic changes. Old right posterior frontal convexity infarct. 3. No acute/traumatic cervical spine pathology. 4. No acute facial bone fractures. 5. Faint linear lucency through the left first rib adjacent to the costovertebral junction may represent a nondisplaced fracture. Clinical correlation is recommended. Electronically Signed   By: Anner Crete M.D.   On: 06/02/2019 22:32   Dg Shoulder Left  Result Date: 06/02/2019 CLINICAL DATA:  Pain status post fall EXAM: LEFT SHOULDER - 2+ VIEW COMPARISON:  None. FINDINGS: There is no acute displaced fracture or dislocation involving the left shoulder. There is an irregular appearance of the third and fourth ribs anterior laterally on the left. There is mild superior subluxation of the humeral head which may indicate a rotator cuff injury. There are degenerative changes of the left  shoulder. There is soft tissue swelling about the left shoulder. IMPRESSION: 1. No acute displaced fracture or dislocation involving the left shoulder. 2. Irregular appearance of the left third and fourth ribs. Correlation with point tenderness is recommended. If there is clinical concern for an acute left-sided rib fracture, follow-up with dedicated rib radiographs is recommended. Electronically Signed   By: Constance Holster M.D.   On: 06/02/2019 21:15   Ct Maxillofacial Wo Contrast  Result Date: 06/02/2019 CLINICAL DATA:  83 year old male with fall and trauma to the head. EXAM: CT HEAD WITHOUT CONTRAST CT MAXILLOFACIAL WITHOUT CONTRAST CT CERVICAL  SPINE WITHOUT CONTRAST TECHNIQUE: Multidetector CT imaging of the head, cervical spine, and maxillofacial structures were performed using the standard protocol without intravenous contrast. Multiplanar CT image reconstructions of the cervical spine and maxillofacial structures were also generated. COMPARISON:  Brain MRI dated 05/10/2018 and head CT dated 05/10/2018 FINDINGS: CT HEAD FINDINGS Brain: There is moderate age-related atrophy and chronic microvascular ischemic changes. An area of old infarct noted in the right posterior frontal convexity. There is no acute intracranial hemorrhage. No mass effect or midline shift. No extra-axial fluid collection. Vascular: No hyperdense vessel or unexpected calcification. Skull: Normal. Negative for fracture or focal lesion. Other: Laceration of the forehead with a small hematoma. CT MAXILLOFACIAL FINDINGS Osseous: No fracture or mandibular dislocation. No destructive process. Orbits: Negative. No traumatic or inflammatory finding. Sinuses: There is chronic partial opacification of the sphenoid sinus. The remainder of the visualized paranasal sinuses and mastoid air cells are clear. Soft tissues: Negative. CT CERVICAL SPINE FINDINGS Alignment: No acute subluxation. Skull base and vertebrae: No acute cervical spine fractures. Soft tissues and spinal canal: No prevertebral fluid or swelling. No visible canal hematoma. Disc levels: Multilevel degenerative changes most prominent at C5-C6 and C6-C7. Upper chest: Negative. Other: Faint linear lucency through the left first rib adjacent to the costovertebral junction may represent a nondisplaced fracture (series 10, image 73 and coronal series 9, image 23 clinical correlation is recommended. IMPRESSION: 1. No acute intracranial hemorrhage. 2. Age-related atrophy and chronic microvascular ischemic changes. Old right posterior frontal convexity infarct. 3. No acute/traumatic cervical spine pathology. 4. No acute facial bone  fractures. 5. Faint linear lucency through the left first rib adjacent to the costovertebral junction may represent a nondisplaced fracture. Clinical correlation is recommended. Electronically Signed   By: Anner Crete M.D.   On: 06/02/2019 22:32    ____________________________________________   PROCEDURES  Procedure(s) performed (including Critical Care):  Procedures   ____________________________________________   INITIAL IMPRESSION / ASSESSMENT AND PLAN / ED COURSE  Nathaniel Gamble was evaluated in Emergency Department on 06/02/2019 for the symptoms described in the history of present illness. He was evaluated in the context of the global COVID-19 pandemic, which necessitated consideration that the patient might be at risk for infection with the SARS-CoV-2 virus that causes COVID-19. Institutional protocols and algorithms that pertain to the evaluation of patients at risk for COVID-19 are in a state of rapid change based on information released by regulatory bodies including the CDC and federal and state organizations. These policies and algorithms were followed during the patient's care in the ED.     Patient presents with a mechanical fall.  Patient says that he missed a step.  Denies any chest pain or shortness breath prior to fall.  Will get EKG to evaluate for ACS bolus patient.  Will get basic labs to evaluate for anemia or electrolyte abnormalities.  Patient denies any urinary symptoms.  No abdominal pain to suggest issues with his AAA but will continue to evaluate.  Will get CT head to evaluate for epidural subdural hematoma.  CT cervical evaluate for fracture.  CT face evaluate for fracture.  Also get an x-ray of the left shoulder evaluate for fracture.  Low suspicion for intrathoracic injury given no tenderness on exam.  No abdominal tenderness just abdominal injury.  White count slightly elevated 12.5 but patient denying infection symptoms.  Patient platelets are baseline  low.  Patient's creatinine is around baseline at 1.5.   CT scans negative for fractures.  There is question of possible rib fracture however I reevaluated patient continues not have any pain on his ribs.  Offered patient dedicated rib x-ray but he declined.  Stating he did not have any pain.  Will give patient a short course of oxycodone just in case he does have been fracture to make sure that he is adequate breathing.  Patient ambulating around the room satting well.    11:19 PM reevaluated patient.  Patient continues not having abdominal pain.  Washed off the wound.  He feels comfortable with discharge home  I discussed the provisional nature of ED diagnosis, the treatment so far, the ongoing plan of care, follow up appointments and return precautions with the patient and any family or support people present. They expressed understanding and agreed with the plan, discharged home.         ____________________________________________   FINAL CLINICAL IMPRESSION(S) / ED DIAGNOSES   Final diagnoses:  Fall, initial encounter  Abrasion of head, initial encounter      MEDICATIONS GIVEN DURING THIS VISIT:  Medications  fentaNYL (SUBLIMAZE) injection 50 mcg (50 mcg Intravenous Given 06/02/19 2134)     ED Discharge Orders         Ordered    oxyCODONE (ROXICODONE) 5 MG immediate release tablet  Every 8 hours PRN     06/02/19 2334           Note:  This document was prepared using Dragon voice recognition software and may include unintentional dictation errors.   Vanessa [place], MD 06/02/19 [phone]

## 2019-06-02 NOTE — ED Notes (Signed)
MD Funke at bedside 

## 2019-06-02 NOTE — ED Triage Notes (Signed)
Pt states fell backwards down three steps at 1830 today. Pt complains of posterior cervical spine pain, abrasion noted to head, facial bruising present. No drainage from ears or nose. Rigid c collar placed. NO LOC per pt.

## 2019-06-04 ENCOUNTER — Other Ambulatory Visit: Payer: Self-pay | Admitting: Physician Assistant

## 2019-06-04 ENCOUNTER — Other Ambulatory Visit: Payer: Self-pay

## 2019-06-04 ENCOUNTER — Ambulatory Visit
Admission: RE | Admit: 2019-06-04 | Discharge: 2019-06-04 | Disposition: A | Discharge status: Home / Self Care | Payer: BC Managed Care – PPO | Source: Ambulatory Visit | Attending: Physician Assistant | Admitting: Physician Assistant

## 2019-06-04 DIAGNOSIS — W19XXXD Unspecified fall, subsequent encounter: Secondary | ICD-10-CM | POA: Insufficient documentation

## 2019-06-04 DIAGNOSIS — M47812 Spondylosis without myelopathy or radiculopathy, cervical region: Secondary | ICD-10-CM | POA: Diagnosis not present

## 2019-06-04 DIAGNOSIS — R519 Headache, unspecified: Secondary | ICD-10-CM

## 2019-06-04 DIAGNOSIS — R51 Headache: Secondary | ICD-10-CM | POA: Insufficient documentation

## 2019-06-04 DIAGNOSIS — Z7901 Long term (current) use of anticoagulants: Secondary | ICD-10-CM | POA: Insufficient documentation

## 2019-06-11 ENCOUNTER — Other Ambulatory Visit: Payer: Self-pay | Admitting: Gastroenterology

## 2019-06-11 DIAGNOSIS — R634 Abnormal weight loss: Secondary | ICD-10-CM

## 2019-06-11 DIAGNOSIS — R1310 Dysphagia, unspecified: Secondary | ICD-10-CM

## 2019-06-12 ENCOUNTER — Other Ambulatory Visit: Payer: Self-pay

## 2019-06-12 ENCOUNTER — Ambulatory Visit
Admission: RE | Admit: 2019-06-12 | Discharge: 2019-06-12 | Disposition: A | Discharge status: Home / Self Care | Payer: BC Managed Care – PPO | Source: Ambulatory Visit | Attending: Gastroenterology | Admitting: Gastroenterology

## 2019-06-12 DIAGNOSIS — R1310 Dysphagia, unspecified: Secondary | ICD-10-CM | POA: Insufficient documentation

## 2019-06-12 DIAGNOSIS — R634 Abnormal weight loss: Secondary | ICD-10-CM | POA: Diagnosis present

## 2019-07-08 ENCOUNTER — Other Ambulatory Visit: Payer: Self-pay | Admitting: Gastroenterology

## 2019-07-08 DIAGNOSIS — R1310 Dysphagia, unspecified: Secondary | ICD-10-CM

## 2019-07-08 DIAGNOSIS — Z931 Gastrostomy status: Secondary | ICD-10-CM

## 2019-07-08 DIAGNOSIS — R634 Abnormal weight loss: Secondary | ICD-10-CM

## 2019-07-20 ENCOUNTER — Inpatient Hospital Stay
Admission: EM | Admit: 2019-07-20 | Discharge: 2019-07-21 | DRG: 065 | Disposition: A | Discharge status: Home Health | Payer: BC Managed Care – PPO | Attending: Internal Medicine | Admitting: Internal Medicine

## 2019-07-20 ENCOUNTER — Encounter: Payer: Self-pay | Admitting: Emergency Medicine

## 2019-07-20 ENCOUNTER — Inpatient Hospital Stay: Payer: BC Managed Care – PPO

## 2019-07-20 ENCOUNTER — Emergency Department: Payer: BC Managed Care – PPO

## 2019-07-20 ENCOUNTER — Other Ambulatory Visit: Payer: Self-pay

## 2019-07-20 DIAGNOSIS — N183 Chronic kidney disease, stage 3 (moderate): Secondary | ICD-10-CM | POA: Diagnosis present

## 2019-07-20 DIAGNOSIS — Z951 Presence of aortocoronary bypass graft: Secondary | ICD-10-CM

## 2019-07-20 DIAGNOSIS — R471 Dysarthria and anarthria: Secondary | ICD-10-CM | POA: Diagnosis present

## 2019-07-20 DIAGNOSIS — Z888 Allergy status to other drugs, medicaments and biological substances status: Secondary | ICD-10-CM | POA: Diagnosis not present

## 2019-07-20 DIAGNOSIS — I714 Abdominal aortic aneurysm, without rupture: Secondary | ICD-10-CM | POA: Diagnosis present

## 2019-07-20 DIAGNOSIS — Z87891 Personal history of nicotine dependence: Secondary | ICD-10-CM

## 2019-07-20 DIAGNOSIS — I2510 Atherosclerotic heart disease of native coronary artery without angina pectoris: Secondary | ICD-10-CM | POA: Diagnosis present

## 2019-07-20 DIAGNOSIS — R29702 NIHSS score 2: Secondary | ICD-10-CM | POA: Diagnosis present

## 2019-07-20 DIAGNOSIS — I639 Cerebral infarction, unspecified: Secondary | ICD-10-CM | POA: Diagnosis present

## 2019-07-20 DIAGNOSIS — E1151 Type 2 diabetes mellitus with diabetic peripheral angiopathy without gangrene: Secondary | ICD-10-CM | POA: Diagnosis present

## 2019-07-20 DIAGNOSIS — Z7902 Long term (current) use of antithrombotics/antiplatelets: Secondary | ICD-10-CM | POA: Diagnosis not present

## 2019-07-20 DIAGNOSIS — R29810 Facial weakness: Secondary | ICD-10-CM | POA: Diagnosis present

## 2019-07-20 DIAGNOSIS — Z7901 Long term (current) use of anticoagulants: Secondary | ICD-10-CM | POA: Diagnosis not present

## 2019-07-20 DIAGNOSIS — E785 Hyperlipidemia, unspecified: Secondary | ICD-10-CM | POA: Diagnosis present

## 2019-07-20 DIAGNOSIS — Z7982 Long term (current) use of aspirin: Secondary | ICD-10-CM

## 2019-07-20 DIAGNOSIS — I480 Paroxysmal atrial fibrillation: Secondary | ICD-10-CM | POA: Diagnosis present

## 2019-07-20 DIAGNOSIS — Z20828 Contact with and (suspected) exposure to other viral communicable diseases: Secondary | ICD-10-CM | POA: Diagnosis present

## 2019-07-20 DIAGNOSIS — Z887 Allergy status to serum and vaccine status: Secondary | ICD-10-CM

## 2019-07-20 DIAGNOSIS — I252 Old myocardial infarction: Secondary | ICD-10-CM

## 2019-07-20 DIAGNOSIS — Z79899 Other long term (current) drug therapy: Secondary | ICD-10-CM

## 2019-07-20 DIAGNOSIS — I69354 Hemiplegia and hemiparesis following cerebral infarction affecting left non-dominant side: Secondary | ICD-10-CM

## 2019-07-20 DIAGNOSIS — Z85820 Personal history of malignant melanoma of skin: Secondary | ICD-10-CM

## 2019-07-20 DIAGNOSIS — R4701 Aphasia: Secondary | ICD-10-CM | POA: Diagnosis present

## 2019-07-20 DIAGNOSIS — I129 Hypertensive chronic kidney disease with stage 1 through stage 4 chronic kidney disease, or unspecified chronic kidney disease: Secondary | ICD-10-CM | POA: Diagnosis present

## 2019-07-20 DIAGNOSIS — E1122 Type 2 diabetes mellitus with diabetic chronic kidney disease: Secondary | ICD-10-CM | POA: Diagnosis present

## 2019-07-20 DIAGNOSIS — Z885 Allergy status to narcotic agent status: Secondary | ICD-10-CM

## 2019-07-20 LAB — URINALYSIS, COMPLETE (UACMP) WITH MICROSCOPIC
Bacteria, UA: NONE SEEN
Bilirubin Urine: NEGATIVE
Glucose, UA: NEGATIVE mg/dL
Hgb urine dipstick: NEGATIVE
Ketones, ur: NEGATIVE mg/dL
Leukocytes,Ua: NEGATIVE
Nitrite: NEGATIVE
Protein, ur: NEGATIVE mg/dL
Specific Gravity, Urine: 1.013 (ref 1.005–1.030)
Squamous Epithelial / HPF: NONE SEEN (ref 0–5)
pH: 7 (ref 5.0–8.0)

## 2019-07-20 LAB — COMPREHENSIVE METABOLIC PANEL
ALT: 23 U/L (ref 0–44)
AST: 18 U/L (ref 15–41)
Albumin: 4.1 g/dL (ref 3.5–5.0)
Alkaline Phosphatase: 89 U/L (ref 38–126)
Anion gap: 12 (ref 5–15)
BUN: 48 mg/dL — ABNORMAL HIGH (ref 8–23)
CO2: 27 mmol/L (ref 22–32)
Calcium: 9.9 mg/dL (ref 8.9–10.3)
Chloride: 101 mmol/L (ref 98–111)
Creatinine, Ser: 1.46 mg/dL — ABNORMAL HIGH (ref 0.61–1.24)
GFR calc Af Amer: 52 mL/min — ABNORMAL LOW (ref >60)
GFR calc non Af Amer: 45 mL/min — ABNORMAL LOW (ref >60)
Glucose, Bld: 109 mg/dL — ABNORMAL HIGH (ref 70–99)
Potassium: 4.9 mmol/L (ref 3.5–5.1)
Sodium: 140 mmol/L (ref 135–145)
Total Bilirubin: 1.3 mg/dL — ABNORMAL HIGH (ref 0.3–1.2)
Total Protein: 7.2 g/dL (ref 6.5–8.1)

## 2019-07-20 LAB — URINE DRUG SCREEN, QUALITATIVE (ARMC ONLY)
Amphetamines, Ur Screen: NOT DETECTED
Barbiturates, Ur Screen: NOT DETECTED
Benzodiazepine, Ur Scrn: NOT DETECTED
Cannabinoid 50 Ng, Ur ~~LOC~~: NOT DETECTED
Cocaine Metabolite,Ur ~~LOC~~: NOT DETECTED
MDMA (Ecstasy)Ur Screen: NOT DETECTED
Methadone Scn, Ur: NOT DETECTED
Opiate, Ur Screen: NOT DETECTED
Phencyclidine (PCP) Ur S: NOT DETECTED
Tricyclic, Ur Screen: NOT DETECTED

## 2019-07-20 LAB — DIFFERENTIAL
Abs Immature Granulocytes: 0.02 10*3/uL (ref 0.00–0.07)
Basophils Absolute: 0 10*3/uL (ref 0.0–0.1)
Basophils Relative: 1 %
Eosinophils Absolute: 0.1 10*3/uL (ref 0.0–0.5)
Eosinophils Relative: 1 %
Immature Granulocytes: 0 %
Lymphocytes Relative: 23 %
Lymphs Abs: 1.7 10*3/uL (ref 0.7–4.0)
Monocytes Absolute: 0.6 10*3/uL (ref 0.1–1.0)
Monocytes Relative: 9 %
Neutro Abs: 4.7 10*3/uL (ref 1.7–7.7)
Neutrophils Relative %: 66 %

## 2019-07-20 LAB — ETHANOL: Alcohol, Ethyl (B): <10 mg/dL (ref <10)

## 2019-07-20 LAB — CBC
HCT: 38.4 % — ABNORMAL LOW (ref 39.0–52.0)
Hemoglobin: 12.4 g/dL — ABNORMAL LOW (ref 13.0–17.0)
MCH: 30.2 pg (ref 26.0–34.0)
MCHC: 32.3 g/dL (ref 30.0–36.0)
MCV: 93.4 fL (ref 80.0–100.0)
Platelets: 126 10*3/uL — ABNORMAL LOW (ref 150–400)
RBC: 4.11 MIL/uL — ABNORMAL LOW (ref 4.22–5.81)
RDW: 15.8 % — ABNORMAL HIGH (ref 11.5–15.5)
WBC: 7.1 10*3/uL (ref 4.0–10.5)
nRBC: 0 % (ref 0.0–0.2)

## 2019-07-20 LAB — GLUCOSE, CAPILLARY
Glucose-Capillary: 102 mg/dL — ABNORMAL HIGH (ref 70–99)
Glucose-Capillary: 93 mg/dL (ref 70–99)
Glucose-Capillary: 103 mg/dL — ABNORMAL HIGH (ref 70–99)

## 2019-07-20 LAB — HEMOGLOBIN A1C
Hgb A1c MFr Bld: 5.2 % (ref 4.8–5.6)
Mean Plasma Glucose: 102.54 mg/dL

## 2019-07-20 LAB — PROTIME-INR
INR: 1.4 — ABNORMAL HIGH (ref 0.8–1.2)
Prothrombin Time: 17.1 seconds — ABNORMAL HIGH (ref 11.4–15.2)

## 2019-07-20 LAB — APTT: aPTT: 31 seconds (ref 24–36)

## 2019-07-20 LAB — SARS CORONAVIRUS 2 BY RT PCR (HOSPITAL ORDER, PERFORMED IN ~~LOC~~ HOSPITAL LAB): SARS Coronavirus 2: NEGATIVE

## 2019-07-20 MED ORDER — CARVEDILOL 25 MG PO TABS
25.0000 mg | ORAL_TABLET | Freq: Two times a day (BID) | ORAL | Status: DC
  Administered 2019-07-21: 25 mg
  Filled 2019-07-20: qty 1

## 2019-07-20 MED ORDER — LOSARTAN POTASSIUM 50 MG PO TABS
100.0000 mg | ORAL_TABLET | Freq: Every day | ORAL | Status: DC
  Administered 2019-07-21: 100 mg via ORAL
  Filled 2019-07-20: qty 2

## 2019-07-20 MED ORDER — SENNOSIDES-DOCUSATE SODIUM 8.6-50 MG PO TABS: 1.0000 | ORAL_TABLET | Freq: Every day | ORAL | Status: DC

## 2019-07-20 MED ORDER — ACETAMINOPHEN 325 MG PO TABS: 650.0000 mg | ORAL_TABLET | Freq: Four times a day (QID) | ORAL | Status: DC | PRN

## 2019-07-20 MED ORDER — APIXABAN 5 MG PO TABS
5.0000 mg | ORAL_TABLET | Freq: Two times a day (BID) | ORAL | Status: DC
  Administered 2019-07-20 – 2019-07-21 (×2): 5 mg
  Filled 2019-07-20 (×2): qty 1

## 2019-07-20 MED ORDER — ATORVASTATIN CALCIUM 20 MG PO TABS: 20.0000 mg | ORAL_TABLET | Freq: Every day | ORAL | Status: DC

## 2019-07-20 MED ORDER — ADULT MULTIVITAMIN W/MINERALS CH
1.0000 | ORAL_TABLET | Freq: Every day | ORAL | Status: DC
  Administered 2019-07-21: 10:00:00 1 via ORAL
  Filled 2019-07-20: qty 1

## 2019-07-20 MED ORDER — INSULIN ASPART 100 UNIT/ML ~~LOC~~ SOLN: 0.0000 [IU] | Freq: Every day | SUBCUTANEOUS | Status: DC

## 2019-07-20 MED ORDER — AMLODIPINE BESYLATE 10 MG PO TABS
10.0000 mg | ORAL_TABLET | Freq: Every day | ORAL | Status: DC
  Administered 2019-07-21: 10 mg
  Filled 2019-07-20: qty 1

## 2019-07-20 MED ORDER — VITAMIN D (ERGOCALCIFEROL) 1.25 MG (50000 UNIT) PO CAPS: 50000.0000 [IU] | ORAL_CAPSULE | ORAL | Status: DC

## 2019-07-20 MED ORDER — INSULIN ASPART 100 UNIT/ML ~~LOC~~ SOLN: 0.0000 [IU] | Freq: Three times a day (TID) | SUBCUTANEOUS | Status: DC

## 2019-07-20 MED ORDER — SODIUM CHLORIDE 0.9 % IV SOLN
INTRAVENOUS | Status: DC
  Administered 2019-07-20: 15:00:00 via INTRAVENOUS

## 2019-07-20 MED ORDER — STROKE: EARLY STAGES OF RECOVERY BOOK
Freq: Once | Status: AC
  Administered 2019-07-20: 15:00:00

## 2019-07-20 MED ORDER — SENNA 8.6 MG PO TABS: 1.0000 | ORAL_TABLET | Freq: Two times a day (BID) | ORAL | Status: DC | PRN

## 2019-07-20 MED ORDER — SCOPOLAMINE 1 MG/3DAYS TD PT72
1.0000 | MEDICATED_PATCH | TRANSDERMAL | Status: DC | PRN
  Filled 2019-07-20: qty 1

## 2019-07-20 MED ORDER — SIMVASTATIN 20 MG PO TABS: 40.0000 mg | ORAL_TABLET | Freq: Every day | ORAL | Status: DC

## 2019-07-20 MED ORDER — HYDRALAZINE HCL 10 MG PO TABS: 10.0000 mg | ORAL_TABLET | Freq: Every evening | ORAL | Status: DC

## 2019-07-20 NOTE — ED Notes (Signed)
Report given to Ashley, RN

## 2019-07-20 NOTE — H&P (Signed)
[facility] [facility] [place] NAME: Nathaniel Gamble    MR#:  [id number]  DATE OF BIRTH:  12-06-1935  DATE OF ADMISSION:  07/20/2019  PRIMARY CARE PHYSICIAN: Baxter Hire, MD   REQUESTING/REFERRING PHYSICIAN: Brenton Grills, MD  CHIEF COMPLAINT:   Chief Complaint  Patient presents with   Aphasia    HISTORY OF PRESENT ILLNESS:  83 y.o. male with pertinent past medical history of hypertension, hyperlipidemia, CAD s/p CABG, AAA, diabetes mellitus type 2, CKD stage III, left ICA stenosis, prior right watershed distribution infarcts in 2019, and recent multiple punctate ischemic stroke of the right frontal and parietal lobes (05/2019) presenting to the ED with chief complaints of slurred speech and left facial droop.  Patient states she was doing well all day yesterday and was able to walk in his yard without any difficulty.  He went to bed as usual and woke up around 630-7 without any issues. Then at around 8:20 AM while patient was sitting reading a book he noticed drooling on his left corner of the mouth and left facial tingling sensation. Patient's son who is currently at the bedside also report slurred speech without associated symptoms of altered sensorium, dizziness, headache, diplopia, or nausea,vomiting, ipsilateral or contralateral paralysis/weakness. Given his recent stroke and concern for possible stroke patient was transported to the ED for further evaluation.  On arrival to the ED, he was afebrile with blood pressure 168/85 mm Hg and pulse rate 65 beats/min. He had noticeable left facial droop with mild slurred speech; he was alert and oriented x4, and he did not demonstrate any memory deficits.  Initial NIH stroke scale 2.  Initial labs revealed platelets 126, glucose 102 BUN 48, creatinine 1.46.  Noncontrast CT head was obtained and showed moderate area of edema in the posterior right temporal and lower paratracheal region that is consistent  with infarct.  Patient states he was on Eliquis prior to this episode.  He has a loop monitor which was recently interrogated by his cardiology and showed atrial fibrillation.  He will be admitted under hospitalist service for further stroke work-up and management.   PAST MEDICAL HISTORY:   Past Medical History:  Diagnosis Date   Abdominal aortic aneurysm ([place])    monitored by dr. Ubaldo Glassing   Abdominal aortic aneurysm ([place])    Cancer ([place]) 1976   melanoma, under right arm, removed   Chronic kidney disease 2011   stent right kidney   Coronary artery disease    Diabetes mellitus without complication ([place])    Hypertension    Jaw fracture ([place])    Myocardial infarction ([place])    Peripheral vascular disease ([place])    PONV (postoperative nausea and vomiting)    Renal artery stenosis ([place])     PAST SURGICAL HISTORY:   Past Surgical History:  Procedure Laterality Date   CATARACT EXTRACTION Bilateral 2014   CORONARY ARTERY BYPASS GRAFT  2010   INGUINAL HERNIA REPAIR Left 07/09/2015   Procedure: HERNIA REPAIR INGUINAL ADULT;  Surgeon: Leonie Green, MD;  Location: [facility] ORS;  Service: General;  Laterality: Left;   LOOP RECORDER INSERTION N/A 06/28/2018   Procedure: LOOP RECORDER INSERTION;  Surgeon: Isaias Cowman, MD;  Location: [place] [facility];  Service: Cardiovascular;  Laterality: N/A;   MANDIBLE FRACTURE SURGERY Left    RENAL ARTERY STENT Right     SOCIAL HISTORY:   Social History   Tobacco Use   Smoking status: Former Smoker  Smokeless tobacco: Former Systems developer  Substance Use Topics   Alcohol use: Yes    Alcohol/week: 1.0 standard drinks    Types: 1 Glasses of wine per week    FAMILY HISTORY:  History reviewed. No pertinent family history.  DRUG ALLERGIES:   Allergies  Allergen Reactions   Lisinopril Swelling   Tetanus Toxoids Swelling    REVIEW OF SYSTEMS:   Review of Systems  Constitutional: Negative for chills, fever,  malaise/fatigue and weight loss.  HENT: Negative for congestion, hearing loss and sore throat.   Eyes: Positive for double vision. Negative for blurred vision.  Respiratory: Negative for cough, shortness of breath and wheezing.   Cardiovascular: Negative for chest pain, palpitations, orthopnea and leg swelling.  Gastrointestinal: Negative for abdominal pain, diarrhea, nausea and vomiting.  Genitourinary: Negative for dysuria and urgency.  Musculoskeletal: Negative for myalgias.  Skin: Negative for rash.  Neurological: Positive for tingling, sensory change and speech change. Negative for dizziness, focal weakness and headaches.  Psychiatric/Behavioral: Negative for depression.   MEDICATIONS AT HOME:   Prior to Admission medications   Medication Sig Start Date End Date Taking? Authorizing Provider  amLODipine (NORVASC) 10 MG tablet Take 10 mg by mouth daily.    [provider]  aspirin EC 81 MG tablet Take 81 mg by mouth daily.    [provider]  carvedilol (COREG) 25 MG tablet Take 25 mg by mouth 2 (two) times daily with a meal.    [provider]  losartan (COZAAR) 100 MG tablet Take 100 mg by mouth daily.  11/26/18   [provider]  simvastatin (ZOCOR) 40 MG tablet Take 40 mg by mouth at bedtime.    [provider]  Vitamin D, Ergocalciferol, (DRISDOL) 50000 units CAPS capsule Take 50,000 Units by mouth every 30 (thirty) days.  05/19/18   [provider]      VITAL SIGNS:  Blood pressure (!) 165/98, pulse 70, temperature 98 F (36.7 C), resp. rate 14, height 5\' 11"  (1.803 m), weight 79.4 kg, SpO2 98 %.  PHYSICAL EXAMINATION:   Physical Exam  GENERAL:  83 y.o.-year-old patient lying in the bed with no acute distress.  EYES: Pupils equal, round, reactive to light and accommodation. No scleral icterus. Extraocular muscles intact.  HEENT: Head atraumatic, normocephalic. Oropharynx and nasopharynx clear.  NECK:  Supple, no jugular  venous distention. No thyroid enlargement, no tenderness.  LUNGS: Normal breath sounds bilaterally, no wheezing, rales,rhonchi or crepitation. No use of accessory muscles of respiration.  CARDIOVASCULAR: S1, S2 normal. No murmurs, rubs, or gallops.  ABDOMEN: Soft, nontender, nondistended. Bowel sounds present. No organomegaly or mass.  EXTREMITIES: No pedal edema, cyanosis, or clubbing. No rash or lesions. + pedal pulses MUSCULOSKELETAL: Normal bulk, and power was 5+ grip and elbow, knee, and ankle flexion and extension bilaterally.  NEUROLOGIC: Alert and oriented x 3. CN 2-12 intact except for left facial droop and mild slurred speech without aphasia. Sensation to light touch and cold stimuli intact bilaterally. Finger to nose nl. Babinski is downgoing. DTR's (biceps, patellar, and achilles) 2+ and symmetric throughout. Gait not tested due to safety concern. PSYCHIATRIC: The patient is alert and oriented x 3.  SKIN: No obvious rash, lesion, or ulcer.   DATA REVIEWED:  LABORATORY PANEL:   CBC Recent Labs  Lab 07/20/19 0931  WBC 7.1  HGB 12.4*  HCT 38.4*  PLT 126*   ------------------------------------------------------------------------------------------------------------------  Chemistries  Recent Labs  Lab 07/20/19 0931  NA 140  K 4.9  CL 101  CO2 27  GLUCOSE 109*  BUN 48*  CREATININE 1.46*  CALCIUM 9.9  AST 18  ALT 23  ALKPHOS 89  BILITOT 1.3*   ------------------------------------------------------------------------------------------------------------------  Cardiac Enzymes No results for input(s): TROPONINI in the last 168 hours. ------------------------------------------------------------------------------------------------------------------  RADIOLOGY:  Ct Head Wo Contrast  Result Date: 07/20/2019 CLINICAL DATA:  TIA, initial exam. EXAM: CT HEAD WITHOUT CONTRAST TECHNIQUE: Contiguous axial images were obtained from the base of the skull through the vertex  without intravenous contrast. COMPARISON:  06/04/2019 FINDINGS: Brain: Low-density in the posterior right temporal and lower parietal cortex and subjacent white matter most likely a recent infarct given the cytotoxic pattern. There has been a small remote posterior right frontal cortex infarct. Remote left caudate head infarct. Brain atrophy most prominent in the posterior frontal and parietal regions. No acute hemorrhage, hydrocephalus, or masslike finding. Vascular: Atherosclerotic calcification. Skull: Negative Sinuses/Orbits: Negative IMPRESSION: 1. Moderate area of edema in the posterior right temporal and low parietal region that is most consistent with infarct. 2. Small remote right posterior frontal cortex and caudate infarcts. 3. Frontoparietal atrophy. Electronically Signed   By: Monte Fantasia M.D.   On: 07/20/2019 11:12   EKG:  EKG: normal EKG, normal sinus rhythm, unchanged from previous tracings. Vent. rate 68 BPM PR interval * ms QRS duration 85 ms QT/QTc 409/435 ms P-R-T axes 28 -10 59 IMPRESSION AND PLAN:   83 y.o. male with pertinent past medical history of hypertension, hyperlipidemia, CAD s/p CABG, AAA, diabetes mellitus type 2, CKD stage III, left ICA stenosis, prior right watershed distribution infarcts in 2019, and recent multiple punctate ischemic stroke of the right frontal and parietal lobes (05/2019) presenting to the ED with chief complaints of slurred speech and left facial droop.  1. Acute CVA -patient presenting with slurred speech and difficulty swallowing.Recent ESUS started on Eliquis at [facility] - CT head shows moderate area of edema in the posterior right temporal and left parietal region consistent with infarct likely embolic etiology in the setting of atrial fibrillation - Admit to MedSurg unit - Check HgbA1c, fasting lipid panel - Obtain MRI of the brain without contrast - PT consult, OT consult, Speech consult - Recent echocardiogram on 8/3 with EF greater than  55% with no cardiac source of emboli - Continue prophylactic therapy- Eliquis - NPO until RN stroke swallow screen -Telemetry monitoring - Frequent neuro checks - Neurology consult placed. Message sent via Haiku to Dr. Irish Elders  2. Paroxysmal atrial fibrillation -this was noted on implanted loop monitor by his cardiology - Continue Eliquis as above - Follows with cardiology Dr. Ubaldo Glassing at [facility]  3. 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 AAA - CT CAP 06/2019 showed infrarenal abdominal aorta measuring 5.1 cm from 3 cm in 2011 - Vascular surgery following for surveillance  5. Diabetes Mellitus Type 2 with complications - 123456 goal < 7.0 - CBG monitoring - SSI  6. Hypertension - Goal BP <140/90 - Continue Losartan, amlodipine and Coreg  7. Hyperlipidemia - Check lipid panel - LDL goal < 70 -Continue Atorvastatin 20mg  PO qhs  8. CKD -BUN/creatinine improved from previous - Continue to monitor renal function  9. DVT prophylaxis - on Eliquis   All the records are reviewed and case discussed with ED provider. Management plans discussed with the patient, family and they are in agreement.  CODE STATUS: FULL  TOTAL TIME TAKING CARE OF THIS PATIENT: 17  minutes.    on 07/20/2019 at 11:50 AM  Rufina Falco, DNP, FNP-BC [facility] Nurse Practitioner Between 7am to 6pm - Pager [phone]  After 6pm go to [email or web address] - password [email or web address] [facility]  Office  [phone]  CC: Primary care physician; Baxter Hire, MD

## 2019-07-20 NOTE — Progress Notes (Signed)
PHARMACIST - PHYSICIAN ORDER COMMUNICATION  CONCERNING: Amlodipine and Simvastatin  and risk of rhabdomyolysis  DESCRIPTION:  Patients on amlodipine and simvastatin >20mg /day have reported cases of rhabdomyolysis. Pharmacist is to assess simvastatin dose. If > 20 mg, substitute atorvastatin (Lipitor) 1mg  for each 2mg  simvastatin.  This patient is ordered simvastatin 40mg  and amlodipine 10mg .    ACTION TAKEN: Per protocol pharmacy has discontinued the patient's order for simvastatin and replaced it with Atorvastatin 20mg .   Pernell Dupre, PharmD, BCPS Clinical Pharmacist 07/20/2019 2:41 PM

## 2019-07-20 NOTE — ED Notes (Signed)
Attempted to call report and was told that the charge nurse was working to get that pt assigned- was told they would call me back

## 2019-07-20 NOTE — ED Notes (Signed)
ED TO INPATIENT HANDOFF REPORT  ED Nurse Name and Phone #: Latorya Bautch [phone]  S Name/Age/Gender Nathaniel Gamble 83 y.o. male Room/Bed: ED04A/ED04A  Code Status   Code Status: Full Code  Home/SNF/Other Home Patient oriented to: self, place, time and situation Is this baseline? Yes   Triage Complete: Triage complete  Chief Complaint slurred speech  Triage Note Pt to ED by [facility] with c/o of slurred speech and difficulty swallowing. Pt states symptoms began around 8:20 this morning and have since subsided.    Allergies Allergies  Allergen Reactions  . Oxycodone Nausea Only  . Lisinopril Swelling  . Tetanus Toxoids Swelling    Level of Care/Admitting Diagnosis ED Disposition    ED Disposition Condition [place] [facility]: [place] [100120]  Level of Care: Med-Surg [16]  Covid Evaluation: Asymptomatic Screening Protocol (No Symptoms)  Diagn[facility]: [id number]  Admitting Physician: Lang Snow [phone]  Attending Physician: Rufina Falco ACHIENG [phone]  Estimated length of stay: past midnight tomorrow  Certification:: I certify this patient will need inpatient services for at least 2 midnights  PT Class (Do Not Modify): Inpatient [101]  PT Acc Code (Do Not Modify): Private [1]       B Medical/Surgery History Past Medical History:  Diagnosis Date  . Abdominal aortic aneurysm ([place])    monitored by dr. Ubaldo Glassing  . Abdominal aortic aneurysm ([place])   . Cancer ([place]) 1976   melanoma, under right arm, removed  . Chronic kidney disease 2011   stent right kidney  . Coronary artery disease   . Diabetes mellitus without complication ([place])   . Hypertension   . Jaw fracture ([place])   . Myocardial infarction ([place])   . Peripheral vascular disease ([place])   . PONV (postoperative nausea and vomiting)   . Renal artery stenosis [facility])    Past Surgical History:  Procedure Laterality Date  .  CATARACT EXTRACTION Bilateral 2014  . CORONARY ARTERY BYPASS GRAFT  2010  . INGUINAL HERNIA REPAIR Left 07/09/2015   Procedure: HERNIA REPAIR INGUINAL ADULT;  Surgeon: Leonie Green, MD;  Location: [facility] ORS;  Service: General;  Laterality: Left;  . LOOP RECORDER INSERTION N/A 06/28/2018   Procedure: LOOP RECORDER INSERTION;  Surgeon: Isaias Cowman, MD;  Location: [place] [facility];  Service: Cardiovascular;  Laterality: N/A;  . MANDIBLE FRACTURE SURGERY Left   . RENAL ARTERY STENT Right      A IV Location/Drains/Wounds Patient Lines/Drains/Airways Status   Active Line/Drains/Airways    Name:   Placement date:   Placement time:   Site:   Days:   Peripheral IV Posterior;Right Hand   -    -    Hand      Incision (Closed) 07/09/15 Groin Left   07/09/15    1607     1472          Intake/Output Last 24 hours No intake or output data in the 24 hours ending 07/20/19 1337  Labs/Imaging Results for orders placed or performed during the hospital encounter of 07/20/19 (from the past 48 hour(s))  Glucose, capillary     Status: Abnormal   Collection Time: 07/20/19  9:29 AM  Result Value Ref Range   Glucose-Capillary 102 (H) 70 - 99 mg/dL  Ethanol     Status: None   Collection Time: 07/20/19  9:31 AM  Result Value Ref Range   Alcohol, Ethyl (B) <10 <10 mg/dL    Comment: (  NOTE) Lowest detectable limit for serum alcohol is 10 mg/dL. For medical purposes only. Performed at [facility], [address] [place], [place] [address]   Protime-INR     Status: Abnormal   Collection Time: 07/20/19  9:31 AM  Result Value Ref Range   Prothrombin Time 17.1 (H) 11.4 - 15.2 seconds   INR 1.4 (H) 0.8 - 1.2    Comment: (NOTE) INR goal varies based on device and disease states. Performed at [facility], [address] [place], [place] [address]   APTT     Status: None   Collection Time: 07/20/19  9:31 AM  Result Value Ref Range   aPTT 31 24 - 36 seconds     Comment: Performed at [facility], [address] [place], [place] [address]  CBC     Status: Abnormal   Collection Time: 07/20/19  9:31 AM  Result Value Ref Range   WBC 7.1 4.0 - 10.5 K/uL   RBC 4.11 (L) 4.22 - 5.81 MIL/uL   Hemoglobin 12.4 (L) 13.0 - 17.0 g/dL   HCT 38.4 (L) 39.0 - 52.0 %   MCV 93.4 80.0 - 100.0 fL   MCH 30.2 26.0 - 34.0 pg   MCHC 32.3 30.0 - 36.0 g/dL   RDW 15.8 (H) 11.5 - 15.5 %   Platelets 126 (L) 150 - 400 K/uL   nRBC 0.0 0.0 - 0.2 %    Comment: Performed at [facility], [address] [place], [place] [address]  Differential     Status: None   Collection Time: 07/20/19  9:31 AM  Result Value Ref Range   Neutrophils Relative % 66 %   Neutro Abs 4.7 1.7 - 7.7 K/uL   Lymphocytes Relative 23 %   Lymphs Abs 1.7 0.7 - 4.0 K/uL   Monocytes Relative 9 %   Monocytes Absolute 0.6 0.1 - 1.0 K/uL   Eosinophils Relative 1 %   Eosinophils Absolute 0.1 0.0 - 0.5 K/uL   Basophils Relative 1 %   Basophils Absolute 0.0 0.0 - 0.1 K/uL   Immature Granulocytes 0 %   Abs Immature Granulocytes 0.02 0.00 - 0.07 K/uL    Comment: Performed at [facility], [address] [place], [place] [address]  Comprehensive metabolic panel     Status: Abnormal   Collection Time: 07/20/19  9:31 AM  Result Value Ref Range   Sodium 140 135 - 145 mmol/L   Potassium 4.9 3.5 - 5.1 mmol/L   Chloride 101 98 - 111 mmol/L   CO2 27 22 - 32 mmol/L   Glucose, Bld 109 (H) 70 - 99 mg/dL   BUN 48 (H) 8 - 23 mg/dL   Creatinine, Ser 1.46 (H) 0.61 - 1.24 mg/dL   Calcium 9.9 8.9 - 10.3 mg/dL   Total Protein 7.2 6.5 - 8.1 g/dL   Albumin 4.1 3.5 - 5.0 g/dL   AST 18 15 - 41 U/L   ALT 23 0 - 44 U/L   Alkaline Phosphatase 89 38 - 126 U/L   Total Bilirubin 1.3 (H) 0.3 - 1.2 mg/dL   GFR calc non Af Amer 45 (L) >60 mL/min   GFR calc Af Amer 52 (L) >60 mL/min   Anion gap 12 5 - 15    Comment: Performed at [facility], [address] [place], [place] [address]   Urine Drug Screen, Qualitative     Status: None   Collection Time: 07/20/19 11:14 AM  Result  Value Ref Range   Tricyclic, Ur Screen NONE DETECTED NONE DETECTED   Amphetamines, Ur Screen NONE DETECTED NONE DETECTED   MDMA (Ecstasy)Ur Screen NONE DETECTED NONE DETECTED   Cocaine Metabolite,Ur [place] NONE DETECTED NONE DETECTED   Opiate, Ur Screen NONE DETECTED NONE DETECTED   Phencyclidine (PCP) Ur S NONE DETECTED NONE DETECTED   Cannabinoid 50 Ng, Ur [place] NONE DETECTED NONE DETECTED   Barbiturates, Ur Screen NONE DETECTED NONE DETECTED   Benzodiazepine, Ur Scrn NONE DETECTED NONE DETECTED   Methadone Scn, Ur NONE DETECTED NONE DETECTED    Comment: (NOTE) Tricyclics + metabolites, urine    Cutoff 1000 ng/mL Amphetamines + metabolites, urine  Cutoff 1000 ng/mL MDMA (Ecstasy), urine              Cutoff 500 ng/mL Cocaine Metabolite, urine          Cutoff 300 ng/mL Opiate + metabolites, urine        Cutoff 300 ng/mL Phencyclidine (PCP), urine         Cutoff 25 ng/mL Cannabinoid, urine                 Cutoff 50 ng/mL Barbiturates + metabolites, urine  Cutoff 200 ng/mL Benzodiazepine, urine              Cutoff 200 ng/mL Methadone, urine                   Cutoff 300 ng/mL The urine drug screen provides only a preliminary, unconfirmed analytical test result and should not be used for non-medical purposes. Clinical consideration and professional judgment should be applied to any positive drug screen result due to possible interfering substances. A more specific alternate chemical method must be used in order to obtain a confirmed analytical result. Gas chromatography / mass spectrometry (GC/MS) is the preferred confirmat ory method. Performed at [facility], [address] [place], [place] [address]   Urinalysis, Complete w Microscopic     Status: Abnormal   Collection Time: 07/20/19 11:14 AM  Result Value Ref Range   Color, Urine YELLOW (A) YELLOW   APPearance CLEAR (A) CLEAR    Specific Gravity, Urine 1.013 1.005 - 1.030   pH 7.0 5.0 - 8.0   Glucose, UA NEGATIVE NEGATIVE mg/dL   Hgb urine dipstick NEGATIVE NEGATIVE   Bilirubin Urine NEGATIVE NEGATIVE   Ketones, ur NEGATIVE NEGATIVE mg/dL   Protein, ur NEGATIVE NEGATIVE mg/dL   Nitrite NEGATIVE NEGATIVE   Leukocytes,Ua NEGATIVE NEGATIVE   RBC / HPF 0-5 0 - 5 RBC/hpf   WBC, UA 0-5 0 - 5 WBC/hpf   Bacteria, UA NONE SEEN NONE SEEN   Squamous Epithelial / LPF NONE SEEN 0 - 5   Mucus PRESENT     Comment: Performed at [facility], [address] [place],  [address]   Ct Head Wo Contrast  Result Date: 07/20/2019 CLINICAL DATA:  TIA, initial exam. EXAM: CT HEAD WITHOUT CONTRAST TECHNIQUE: Contiguous axial images were obtained from the base of the skull through the vertex without intravenous contrast. COMPARISON:  06/04/2019 FINDINGS: Brain: Low-density in the posterior right temporal and lower parietal cortex and subjacent white matter most likely a recent infarct given the cytotoxic pattern. There has been a small remote posterior right frontal cortex infarct. Remote left caudate head infarct. Brain atrophy most prominent in the posterior frontal and parietal regions. No acute hemorrhage, hydrocephalus, or masslike finding. Vascular: Atherosclerotic calcification. Skull: Negative Sinuses/Orbits: Negative IMPRESSION: 1. Moderate area  of edema in the posterior right temporal and low parietal region that is most consistent with infarct. 2. Small remote right posterior frontal cortex and caudate infarcts. 3. Frontoparietal atrophy. Electronically Signed   By: Monte Fantasia M.D.   On: 07/20/2019 11:12    Pending Labs Unresulted Labs (From admission, onward)    Start     Ordered   07/21/19 0500  Hemoglobin A1c  Tomorrow morning,   STAT     07/20/19 1254   07/21/19 0500  Lipid panel  Tomorrow morning,   STAT    Comments: Fasting    07/20/19 1254   07/20/19 1251  Hemoglobin A1c  Once,   STAT    Comments:  To assess prior glycemic control    07/20/19 1254   07/20/19 1200  SARS Coronavirus [facility] order, Performed in [facility] hospital lab) Nasopharyngeal Nasopharyngeal Swab  (Symptomatic/High Risk of Exposure/Tier 1 Patients Labs with Precautions)  ONCE - STAT,   STAT    Question Answer Comment  Is this test for diagnosis or screening Screening   Symptomatic for COVID-19 as defined by CDC No   Hospitalized for COVID-19 No   Admitted to ICU for COVID-19 No   Previously tested for COVID-19 No   Resident in a congregate (group) care setting No   Employed in healthcare setting No      07/20/19 1159          Vitals/Pain Today's Vitals   07/20/19 0928 07/20/19 1000 07/20/19 1258 07/20/19 1330  BP:  (!) 165/98 (!) 169/72 (!) 161/90  Pulse:  70 70 75  Resp:  14 15 17   Temp:      SpO2:  98% 97% 97%  Weight: 79.4 kg     Height: 5\' 11"  (1.803 m)     PainSc: 0-No pain       Isolation Precautions Airborne and Contact precautions  Medications Medications  acetaminophen (TYLENOL) tablet 650 mg (has no administration in time range)  amLODipine (NORVASC) tablet 10 mg (has no administration in time range)  carvedilol (COREG) tablet 25 mg (has no administration in time range)  hydrALAZINE (APRESOLINE) tablet 10 mg (has no administration in time range)  losartan (COZAAR) tablet 100 mg (has no administration in time range)  simvastatin (ZOCOR) tablet 40 mg (has no administration in time range)  scopolamine (TRANSDERM-SCOP) 1 MG/3DAYS 1.5 mg (has no administration in time range)  senna (SENOKOT) tablet 8.6 mg (has no administration in time range)  apixaban (ELIQUIS) tablet 5 mg (has no administration in time range)  multivitamin with minerals tablet 1 tablet (has no administration in time range)  Vitamin D (Ergocalciferol) (DRISDOL) capsule 50,000 Units (has no administration in time range)   stroke: mapping our early stages of recovery book (has no administration in time range)  0.9 %   sodium chloride infusion (has no administration in time range)  senna-docusate (Senokot-S) tablet 1 tablet (has no administration in time range)  insulin aspart (novoLOG) injection 0-9 Units (has no administration in time range)  insulin aspart (novoLOG) injection 0-5 Units (has no administration in time range)    Mobility walks with person assist Low fall risk     R Recommendations: See Admitting Provider Note  Report given to:   Additional Notes:

## 2019-07-20 NOTE — ED Triage Notes (Signed)
Pt to ED by [facility] with c/o of slurred speech and difficulty swallowing. Pt states symptoms began around 8:20 this morning and have since subsided.

## 2019-07-20 NOTE — Plan of Care (Signed)

## 2019-07-20 NOTE — ED Provider Notes (Signed)
[facility] Emergency Department Provider Note  ____________________________________________  Time seen: Approximately 9:59 AM  I have reviewed the triage vital signs and the nursing notes.   HISTORY  Chief Complaint Aphasia    HPI Nathaniel Gamble is a 83 y.o. male with a history of AAA, CKD, CAD, diabetes, hypertension who comes the ED complaining of slurred speech while he was sitting at rest reading a book, started about 8:20 AM.  No falls or trauma.  No fevers chills vision changes or motor weakness.  No change in balance or coronation.  He has had prior strokes in the past.  Symptoms were constant, lasting about 30 to 45 minutes before resolving prior to arrival in the ED.  EMS report that the patient was ambulatory at home with a steady gait.  Denies aggravating or alleviating factors.  Reports compliance with his medications although he has not taken them yet today.      Past Medical History:  Diagnosis Date  . Abdominal aortic aneurysm ([place])    monitored by dr. Ubaldo Glassing  . Abdominal aortic aneurysm ([place])   . Cancer ([place]) 1976   melanoma, under right arm, removed  . Chronic kidney disease 2011   stent right kidney  . Coronary artery disease   . Diabetes mellitus without complication ([place])   . Hypertension   . Jaw fracture ([place])   . Myocardial infarction ([place])   . Peripheral vascular disease ([place])   . PONV (postoperative nausea and vomiting)   . Renal artery stenosis [facility])      Patient Active Problem List   Diagnosis Date Noted  . Hypertension 05/25/2018  . Diabetes mellitus without complication ([place]) [id number]  . Carotid stenosis 05/25/2018  . Acute CVA (cerebrovascular accident) ([place]) 05/10/2018     Past Surgical History:  Procedure Laterality Date  . CATARACT EXTRACTION Bilateral 2014  . CORONARY ARTERY BYPASS GRAFT  2010  . INGUINAL HERNIA REPAIR Left 07/09/2015   Procedure: HERNIA REPAIR INGUINAL ADULT;  Surgeon: Leonie Green, MD;  Location: [facility] ORS;  Service: General;  Laterality: Left;  . LOOP RECORDER INSERTION N/A 06/28/2018   Procedure: LOOP RECORDER INSERTION;  Surgeon: Isaias Cowman, MD;  Location: [facility];  Service: Cardiovascular;  Laterality: N/A;  . MANDIBLE FRACTURE SURGERY Left   . RENAL ARTERY STENT Right      Prior to Admission medications   Medication Sig Start Date End Date Taking? Authorizing Provider  amLODipine (NORVASC) 10 MG tablet Take 10 mg by mouth daily.    [provider]  aspirin EC 81 MG tablet Take 81 mg by mouth daily.    [provider]  carvedilol (COREG) 25 MG tablet Take 25 mg by mouth 2 (two) times daily with a meal.    [provider]  losartan (COZAAR) 100 MG tablet Take 100 mg by mouth daily.  11/26/18   [provider]  simvastatin (ZOCOR) 40 MG tablet Take 40 mg by mouth at bedtime.    [provider]  Vitamin D, Ergocalciferol, (DRISDOL) 50000 units CAPS capsule Take 50,000 Units by mouth every 30 (thirty) days.  05/19/18   [provider]     Allergies Lisinopril and Tetanus toxoids   History reviewed. No pertinent family history.  Social History Social History   Tobacco Use  . Smoking status: Former Research scientist (life sciences)  . Smokeless tobacco: Former Network engineer Use Topics  . Alcohol use: Yes    Alcohol/week: 1.0 standard drinks  Types: 1 Glasses of wine per week  . Drug use: No    Review of Systems  Constitutional:   No fever or chills.  ENT:   No sore throat. No rhinorrhea. Cardiovascular:   No chest pain or syncope. Respiratory:   No dyspnea or cough. Gastrointestinal:   Negative for abdominal pain, vomiting and diarrhea.  Musculoskeletal:   Negative for focal pain or swelling All other systems reviewed and are negative except as documented above in ROS and HPI.  ____________________________________________   PHYSICAL EXAM:  VITAL SIGNS: ED Triage Vitals  Enc Vitals Group      BP 07/20/19 0927 (!) 168/85     Pulse Rate 07/20/19 0927 66     Resp 07/20/19 0927 20     Temp 07/20/19 0927 98 F (36.7 C)     Temp src --      SpO2 07/20/19 0927 99 %     Weight 07/20/19 0928 175 lb (79.4 kg)     Height 07/20/19 0928 5\' 11"  (1.803 m)     Head Circumference --      Peak Flow --      Pain Score 07/20/19 0928 0     Pain Loc --      Pain Edu? --      Excl. in [place]? --     Vital signs reviewed, nursing assessments reviewed.   Constitutional:   Alert and oriented. Non-toxic appearance. Eyes:   Conjunctivae are normal. EOMI. PERRL.  No nystagmus, no APD ENT      Head:   Normocephalic and atraumatic.      Nose:   No congestion/rhinnorhea.       Mouth/Throat:   MMM, no pharyngeal erythema. No peritonsillar mass.       Neck:   No meningismus. Full ROM. Hematological/Lymphatic/Immunilogical:   No cervical lymphadenopathy. Cardiovascular:   RRR. Symmetric bilateral radial and DP pulses.  No murmurs. Cap refill less than 2 seconds. Respiratory:   Normal respiratory effort without tachypnea/retractions. Breath sounds are clear and equal bilaterally. No wheezes/rales/rhonchi. Gastrointestinal:   Soft and nontender. Non distended. There is no CVA tenderness.  No rebound, rigidity, or guarding.  Musculoskeletal:   Normal range of motion in all extremities. No joint effusions.  No lower extremity tenderness.  No edema. Neurologic:   Normal speech and language.  Cranial nerves III through XII intact Motor grossly intact. No pronator drift, normal finger-to-nose, normal heel-to-shin No acute focal neurologic deficits are appreciated. NIH stroke scale 0 Skin:    Skin is warm, dry and intact. No rash noted.  No petechiae, purpura, or bullae.  ____________________________________________    LABS (pertinent positives/negatives) (all labs ordered are listed, but only abnormal results are displayed) Labs Reviewed  PROTIME-INR - Abnormal; Notable for the following  components:      Result Value   Prothrombin Time 17.1 (*)    INR 1.4 (*)    All other components within normal limits  CBC - Abnormal; Notable for the following components:   RBC 4.11 (*)    Hemoglobin 12.4 (*)    HCT 38.4 (*)    RDW 15.8 (*)    Platelets 126 (*)    All other components within normal limits  COMPREHENSIVE METABOLIC PANEL - Abnormal; Notable for the following components:   Glucose, Bld 109 (*)    BUN 48 (*)    Creatinine, Ser 1.46 (*)    Total Bilirubin 1.3 (*)    GFR calc non Af Amer 45 (*)  GFR calc Af Amer 52 (*)    All other components within normal limits  URINALYSIS, COMPLETE (UACMP) WITH MICROSCOPIC - Abnormal; Notable for the following components:   Color, Urine YELLOW (*)    APPearance CLEAR (*)    All other components within normal limits  GLUCOSE, CAPILLARY - Abnormal; Notable for the following components:   Glucose-Capillary 102 (*)    All other components within normal limits  SARS CORONAVIRUS 2 (HOSPITAL ORDER, [place])  ETHANOL  APTT  DIFFERENTIAL  URINE DRUG SCREEN, QUALITATIVE ([facility] ONLY)   ____________________________________________   EKG  Interpreted by me Sinus rhythm rate of 68, normal axis and intervals.  Poor R wave progression.  Normal ST segments and T waves.  ____________________________________________    RADIOLOGY  Ct Head Wo Contrast  Result Date: 07/20/2019 CLINICAL DATA:  TIA, initial exam. EXAM: CT HEAD WITHOUT CONTRAST TECHNIQUE: Contiguous axial images were obtained from the base of the skull through the vertex without intravenous contrast. COMPARISON:  06/04/2019 FINDINGS: Brain: Low-density in the posterior right temporal and lower parietal cortex and subjacent white matter most likely a recent infarct given the cytotoxic pattern. There has been a small remote posterior right frontal cortex infarct. Remote left caudate head infarct. Brain atrophy most prominent in the posterior frontal  and parietal regions. No acute hemorrhage, hydrocephalus, or masslike finding. Vascular: Atherosclerotic calcification. Skull: Negative Sinuses/Orbits: Negative IMPRESSION: 1. Moderate area of edema in the posterior right temporal and low parietal region that is most consistent with infarct. 2. Small remote right posterior frontal cortex and caudate infarcts. 3. Frontoparietal atrophy. Electronically Signed   By: Monte Fantasia M.D.   On: 07/20/2019 11:12    ____________________________________________   PROCEDURES Procedures  ____________________________________________  DIFFERENTIAL DIAGNOSIS   Intracranial hemorrhage, TIA, stroke  CLINICAL IMPRESSION / ASSESSMENT AND PLAN / ED COURSE  Medications ordered in the ED: Medications - No data to display  Pertinent labs & imaging results that were available during my care of the patient were reviewed by me and considered in my medical decision making (see chart for details).  Nathaniel Gamble was evaluated in Emergency Department on 07/20/2019 for the symptoms described in the history of present illness. He was evaluated in the context of the global COVID-19 pandemic, which necessitated consideration that the patient might be at risk for infection with the SARS-CoV-2 virus that causes COVID-19. Institutional protocols and algorithms that pertain to the evaluation of patients at risk for COVID-19 are in a state of rapid change based on information released by regulatory bodies including the CDC and federal and state organizations. These policies and algorithms were followed during the patient's care in the ED.     Clinical Course as of Jul 19 1212  Sat Jul 20, 2019  0929 Patient presents with slurred speech and difficulty swallowing that started at about 8:20 AM today, resolved prior to arrival in the emergency department.  Patient patient feels back to normal.  No recent falls or trauma or illness.  He has a history of stroke and aneurysm.   Neurologic exam is nonfocal, stroke scale 0.  Will obtain CT scan of the head, lab panel, reassess.   [PS]  [phone] Reviewed electronic medical record, shows the patient was recently hospitalized at [facility] for strokelike symptoms 1 month ago.  He had MRI of the brain with MRA showing multiple embolic infarcts that were at least a few weeks old and remote lacunar infarcts, carotid duplex  ultrasound showing less than 50% stenosis bilaterally, echocardiogram bubble study which was unremarkable.  In light of recent stroke work-up, CT scan and MRI brain today can be obtained and if there are no acute findings patient will be determined to be medically stable for discharge home.   [PS]  0958 Creatinine shows baseline CKD.  Other labs are reassuring.   [PS]    Clinical Course User Index [PS] Carrie Mew, MD     ----------------------------------------- 12:12 PM on 07/20/2019 -----------------------------------------  CT head shows acute infarct.  The patient is on Eliquis and Plavix, but despite this he is continuing to have new stroke since his recent stroke work-up a month ago at [facility].  Discussed with hospitalist for further evaluation of his ongoing events.  ____________________________________________   FINAL CLINICAL IMPRESSION(S) / ED DIAGNOSES    Final diagnoses:  Acute ischemic stroke [facility])  Dysarthria     ED Discharge Orders    None      Portions of this note were generated with dragon dictation software. Dictation errors may occur despite best attempts at proofreading.   Carrie Mew, MD 07/20/19 1213

## 2019-07-20 NOTE — ED Notes (Signed)
MRI on phone for screening

## 2019-07-21 DIAGNOSIS — I639 Cerebral infarction, unspecified: Principal | ICD-10-CM

## 2019-07-21 LAB — LIPID PANEL
Cholesterol: 149 mg/dL (ref 0–200)
HDL: 49 mg/dL (ref >40)
LDL Cholesterol: 74 mg/dL (ref 0–99)
Total CHOL/HDL Ratio: 3 RATIO
Triglycerides: 129 mg/dL (ref <150)
VLDL: 26 mg/dL (ref 0–40)

## 2019-07-21 LAB — GLUCOSE, CAPILLARY
Glucose-Capillary: 90 mg/dL (ref 70–99)
Glucose-Capillary: 86 mg/dL (ref 70–99)

## 2019-07-21 LAB — HEMOGLOBIN A1C
Hgb A1c MFr Bld: 5.2 % (ref 4.8–5.6)
Mean Plasma Glucose: 102.54 mg/dL

## 2019-07-21 NOTE — Progress Notes (Signed)
Pt d/c to home via son. IV removed intact. Education completed. VSS. All belongings sent with pt.

## 2019-07-21 NOTE — Evaluation (Signed)
Physical Therapy Evaluation Patient Details Name: Nathaniel Gamble MRN: [id number] DOB: 19-Feb-1936 Today's Date: 07/21/2019   History of Present Illness  Pt is an 83 year old male admitted following c/o slurred speech and facial droop, as well as a fall down 13 steps occuring simultaneously with symptom onset.  Imaging revealed 2 acute punctate nonhemmorhagic infarcts of the R frontal lobe anterior to the motor cortex.  PMH includes Htn, HLD, CABG, AAA, DM, CKD III and a R watershed infarct.  Clinical Impression  Pt is an 83 year old male who lives in a multi story home with his wife.  He has recently completed home health PT and had been gradually progressing walking distance without use of AD.  Pt alert and talkative, requiring redirection at times, but willing to work with therapy.  He is independent for bed mobility and able to sit at bedside without assistance.  Coordination testing revealed very mild deficit of LUE compared to RUE.  Pt with weakness of L side compared to R but no sensation loss reported.  He is able to perform transfers slowly and with use of UE's.  Pt requires UE assistance to steady himself with dynamic gait activity as well as static standing with narrow BOS and eyes closed.  Pt stated that he has been avoiding use of SPC due to fear of tripping over it.  He will continue to benefit from skilled PT with focus on coordination, strength, balance and prevention of future falls.  He will benefit from home health PT following discharge to address above stated deficits and for AD training.    Follow Up Recommendations Home health PT;Supervision - Intermittent    Equipment Recommendations  None recommended by PT    Recommendations for Other Services       Precautions / Restrictions Precautions Precautions: Fall      Mobility  Bed Mobility Overal bed mobility: Independent                Transfers Overall transfer level: Needs assistance   Transfers: Sit to/from  Stand Sit to Stand: Supervision         General transfer comment: Use of UE's to push up from bed and increased time required for transfer and to steady on feet prior to ambulation.  Ambulation/Gait Ambulation/Gait assistance: Min guard Gait Distance (Feet): 40 Feet       Gait velocity interpretation: 1.31 - 2.62 ft/sec, indicative of limited community ambulator General Gait Details: Fair step length and foot clearance.  Mild unsteadiness requiring pt to hold to furniture for correction.  Pt unsure of how to use SPC.  Stairs            Wheelchair Mobility    Modified Rankin (Stroke Patients Only)       Balance Overall balance assessment: Needs assistance Sitting-balance support: Feet supported Sitting balance-Leahy Scale: Good     Standing balance support: Single extremity supported Standing balance-Leahy Scale: Fair Standing balance comment: Requires UE assistance for lifting object from floor and simulating opening cabinet door overhead/reaching outside BOS. Single Leg Stance - Right Leg: 0 Single Leg Stance - Left Leg: 0 Tandem Stance - Right Leg: 0 Tandem Stance - Left Leg: 0 Rhomberg - Eyes Opened: 10 Rh[place]berg - Eyes Closed: 5(Increased a-p sway, pt opening eyes throughout.) High level balance activites: Side stepping;Head turns;Direction changes;Backward walking High Level Balance Comments: Lateral deviations with higher level dynamic gait, unilateral UE assist needed.  Pertinent Vitals/Pain Pain Assessment: No/denies pain    Home Living Family/patient expects to be discharged to:: Private residence Living Arrangements: Spouse/significant other Available Help at Discharge: Family;Available 24 hours/day(Sons and neighbor) Type of Home: House Home Access: Stairs to enter Entrance Stairs-Rails: Right Entrance Stairs-Number of Steps: 3 Home Layout: Multi-level;Able to live on main level with bedroom/bathroom Home Equipment: Gilford Rile - 2  wheels;Bedside commode;Shower seat      Prior Function Level of Independence: Independent         Comments: Pt has recently completed HH PT and has been gradually increasing distance that he walks in his yard each day.  Has avoided use of SPC because he is afraid that he will trip over it.     Hand Dominance   Dominant Hand: Right    Extremity/Trunk Assessment   Upper Extremity Assessment Upper Extremity Assessment: Overall WFL for tasks assessed(L UE: grip, elbow flexion/extension, shoulder flexion/abduction: 4-/5, R UE: grip, elbow flexion/extension, shoulder flexion/abduction: 4/5, no sensation loss reported.)    Lower Extremity Assessment Lower Extremity Assessment: Overall WFL for tasks assessed(L LE: Ankle DF/PF, knee flexion/extension, hip flexion: 4-/5, R LE: Ankle PF/DF, knee flexion/extension, hip flexion: 4-/5.  No sensation loss reported.)    Cervical / Trunk Assessment Cervical / Trunk Assessment: Normal  Communication   Communication: No difficulties  Cognition Arousal/Alertness: Awake/alert Behavior During Therapy: WFL for tasks assessed/performed Overall Cognitive Status: Within Functional Limits for tasks assessed                                 General Comments: Talkative, requires redirection but pleasant and ready to work with therapy.  Follows directions consistently.      General Comments General comments (skin integrity, edema, etc.): Finger to nose: L: 7 sec with mild tremor but no past pointing, R: 5 sec.    Exercises     Assessment/Plan    PT Assessment Patient needs continued PT services  PT Problem List Decreased strength;Decreased mobility;Decreased activity tolerance;Decreased balance;Decreased knowledge of use of DME;Decreased coordination       PT Treatment Interventions DME instruction;Therapeutic activities;Gait training;Therapeutic exercise;Patient/family education;Stair training;Balance training;Functional mobility  training;Neuromuscular re-education    PT Goals (Current goals can be found in the Care Plan section)  Acute Rehab PT Goals Patient Stated Goal: To be able to navigate steps in home/to basement safely. PT Goal Formulation: With patient Time For Goal Achievement: 08/04/19 Potential to Achieve Goals: Good    Frequency 7X/week   Barriers to discharge        Co-evaluation               AM-PAC PT "6 Clicks" Mobility  Outcome Measure Help needed turning from your back to your side while in a flat bed without using bedrails?: None Help needed moving from lying on your back to sitting on the side of a flat bed without using bedrails?: A Little Help needed moving to and from a bed to a chair (including a wheelchair)?: A Little Help needed standing up from a chair using your arms (e.g., wheelchair or bedside chair)?: A Little Help needed to walk in hospital room?: A Little Help needed climbing 3-5 steps with a railing? : A Little 6 Click Score: 19    End of Session Equipment Utilized During Treatment: Gait belt Activity Tolerance: Patient tolerated treatment well Patient left: in bed;with call bell/phone within reach;with bed alarm set   PT Visit Diagnosis:  Unsteadiness on feet (R26.81);History of falling (Z91.81);Muscle weakness (generalized) (M62.81)    Time: [id number] PT Time Calculation (min) (ACUTE ONLY): 21 min   Charges:   PT Evaluation $PT Eval Low Complexity: 1 Low         Roxanne Gates, PT, DPT   Roxanne Gates 07/21/2019, 9:29 AM

## 2019-07-21 NOTE — Discharge Summary (Signed)
[facility] [place] NAME: Nathaniel Gamble    MR#:  [id number]  DATE OF BIRTH:  29-Jun-1936  DATE OF ADMISSION:  07/20/2019 ADMITTING PHYSICIAN: Lang Snow, NP  DATE OF DISCHARGE: 07/21/2019  PRIMARY CARE PHYSICIAN: Baxter Hire, MD    ADMISSION DIAGNOSIS:  Acute ischemic stroke [facility]) [I63.9] Dysarthria [R47.1]  DISCHARGE DIAGNOSIS:  Active Problems:   Acute CVA (cerebrovascular accident) ([place])   SECONDARY DIAGNOSIS:   Past Medical History:  Diagnosis Date  . Abdominal aortic aneurysm ([place])    monitored by dr. Ubaldo Glassing  . Abdominal aortic aneurysm ([place])   . Cancer ([place]) 1976   melanoma, under right arm, removed  . Chronic kidney disease 2011   stent right kidney  . Coronary artery disease   . Diabetes mellitus without complication ([place])   . Hypertension   . Jaw fracture ([place])   . Myocardial infarction ([place])   . Peripheral vascular disease ([place])   . PONV (postoperative nausea and vomiting)   . Renal artery stenosis [facility])     HOSPITAL COURSE:  83 y/o male with CAD s/p CABG, AAA, DM and recent embolic CVA currently on Eliquis for PAF who presented to the emergency room due to slurred speech, left facial droop and left-sided weakness.  1.Two acute punctate nonhemorrhagic cortical infarcts of the right frontal lobe, anterior to the primary motor cortex: These are embolic in nature related to his underlying PAF.  Patient has been evaluated by neurology.  Patient is already on anticoagulation and statin therapy.  He will continue on outpatient regimen.  He will follow-up with his neurologist in 1 week. Performed June 17, 2019 showing ejection fraction of [id number] with no diastolic heart failure.  No cardiac source of emboli.    2.  PAF: Patient is in NSR. He will continue Eliquis.  He will follow-up with Dr. Ubaldo Glassing. Continue Coreg for heart rate control.  3.  Venous sinus thrombosis seen on previous imaging with MRV at [facility]:  This is chronic.  Patient is already on anticoagulation.  4.  AAA measuring 5.1 cm: Patient will need outpatient vascular surgery for surveillance.  5.  Diabetes: Continue outpatient regimen with ADA diet  6.  Essential hypertension: Continue Coreg, Norvasc, losartan  7.  Hyperlipidemia: Continue statin LDL near goal=74 8.  Chronic kidney disease stage III: Creatinine at baseline  DISCHARGE CONDITIONS AND DIET:   Stable diabetic diet  CONSULTS OBTAINED:    DRUG ALLERGIES:   Allergies  Allergen Reactions  . Oxycodone Nausea Only  . Lisinopril Swelling  . Tetanus Toxoids Swelling    DISCHARGE MEDICATIONS:   Allergies as of 07/21/2019      Reactions   Oxycodone Nausea Only   Lisinopril Swelling   Tetanus Toxoids Swelling      Medication List    TAKE these medications   acetaminophen 325 MG tablet Commonly known as: TYLENOL Place 650 mg into feeding tube every 6 (six) hours as needed.   amLODipine 10 MG tablet Commonly known as: NORVASC Place 10 mg into feeding tube daily.   carvedilol 25 MG tablet Commonly known as: COREG Place 25 mg into feeding tube 2 (two) times daily with a meal.   Eliquis 5 MG Tabs tablet Generic drug: apixaban Place 5 mg into feeding tube 2 (two) times daily.   hydrALAZINE 10 MG tablet Commonly known as: APRESOLINE Take 10 mg by mouth Nightly.   ISOSOURCE PO Take by mouth 5 (five) times daily.  losartan 100 MG tablet Commonly known as: COZAAR Take 100 mg by mouth daily.   multivitamin-lutein Caps capsule Take 1 capsule by mouth daily.   scopolamine 1 MG/3DAYS Commonly known as: TRANSDERM-SCOP Place 1 patch onto the skin every 3 (three) days as needed.   senna 8.6 MG tablet Commonly known as: SENOKOT Take 1 tablet by mouth 2 (two) times daily as needed for constipation.   simvastatin 40 MG tablet Commonly known as: ZOCOR Take 40 mg by mouth at bedtime.   Vitamin D (Ergocalciferol) 1.25 MG (50000 UT) Caps  capsule Commonly known as: DRISDOL Take 50,000 Units by mouth every 7 (seven) days.         Today   CHIEF COMPLAINT:  Symptoms have all resolved.  Patient is at baseline.   VITAL SIGNS:  Blood pressure 132/80, pulse 73, temperature (!) 97 F (36.1 C), resp. rate 16, height 5\' 11"  (1.803 m), weight 79.4 kg, SpO2 99 %.   REVIEW OF SYSTEMS:  Review of Systems  Constitutional: Negative.  Negative for chills, fever and malaise/fatigue.  HENT: Negative.  Negative for ear discharge, ear pain, hearing loss, nosebleeds and sore throat.   Eyes: Negative.  Negative for blurred vision and pain.  Respiratory: Negative.  Negative for cough, hemoptysis, shortness of breath and wheezing.   Cardiovascular: Negative.  Negative for chest pain, palpitations and leg swelling.  Gastrointestinal: Negative.  Negative for abdominal pain, blood in stool, diarrhea, nausea and vomiting.  Genitourinary: Negative.  Negative for dysuria.  Musculoskeletal: Negative.  Negative for back pain.  Skin: Negative.   Neurological: Negative for dizziness, tremors, speech change, focal weakness, seizures and headaches.  Endo/Heme/Allergies: Negative.  Does not bruise/bleed easily.  Psychiatric/Behavioral: Negative.  Negative for depression, hallucinations and suicidal ideas.     PHYSICAL EXAMINATION:  GENERAL:  83 y.o.-year-old patient lying in the bed with no acute distress.  NECK:  Supple, no jugular venous distention. No thyroid enlargement, no tenderness.  LUNGS: Normal breath sounds bilaterally, no wheezing, rales,rhonchi  No use of accessory muscles of respiration.  CARDIOVASCULAR: NSR  S1  S2 normal. No murmurs, rubs, or gallops.  ABDOMEN: Soft, non-tender, non-distended. Bowel sounds present. No organomegaly or mass.  EXTREMITIES: No pedal edema, cyanosis, or clubbing.  PSYCHIATRIC: The patient is alert and oriented x 3.  SKIN: No obvious rash, lesion, or ulcer.   DATA REVIEW:   CBC Recent Labs   Lab 07/20/19 0931  WBC 7.1  HGB 12.4*  HCT 38.4*  PLT 126*    Chemistries  Recent Labs  Lab 07/20/19 0931  NA 140  K 4.9  CL 101  CO2 27  GLUCOSE 109*  BUN 48*  CREATININE 1.46*  CALCIUM 9.9  AST 18  ALT 23  ALKPHOS 89  BILITOT 1.3*    Cardiac Enzymes No results for input(s): TROPONINI in the last 168 hours.  Microbiology Results  @[email or web address] @  RADIOLOGY:  Ct Head Wo Contrast  Result Date: 07/20/2019 CLINICAL DATA:  TIA, initial exam. EXAM: CT HEAD WITHOUT CONTRAST TECHNIQUE: Contiguous axial images were obtained from the base of the skull through the vertex without intravenous contrast. COMPARISON:  06/04/2019 FINDINGS: Brain: Low-density in the posterior right temporal and lower parietal cortex and subjacent white matter most likely a recent infarct given the cytotoxic pattern. There has been a small remote posterior right frontal cortex infarct. Remote left caudate head infarct. Brain atrophy most prominent in the posterior frontal and parietal regions. No acute hemorrhage, hydrocephalus, or masslike finding. Vascular: Atherosclerotic  calcification. Skull: Negative Sinuses/Orbits: Negative IMPRESSION: 1. Moderate area of edema in the posterior right temporal and low parietal region that is most consistent with infarct. 2. Small remote right posterior frontal cortex and caudate infarcts. 3. Frontoparietal atrophy. Electronically Signed   By: Monte Fantasia M.D.   On: 07/20/2019 11:12   Mr Brain Wo Contrast  Result Date: 07/20/2019 CLINICAL DATA:  Stroke, follow-up. Slurred speech and difficulty swallowing beginning at 8:20 a.m. today. EXAM: MRI HEAD WITHOUT CONTRAST TECHNIQUE: Multiplanar, multiecho pulse sequences of the brain and surrounding structures were obtained without intravenous contrast. COMPARISON:  CT head without contrast 07/20/2019 FINDINGS: Brain: 2 punctate foci of restricted diffusion are present in the right frontal lobe in the pre frontal area. There is a  more diffuse focus of T2 hyperintensity involving the posterior right temporal lobe and inferior right parietal lobe as noted on CT. This results in T2 shine through on the diffusion-weighted images. No restricted diffusion is present in this region. Extensive white matter disease extending into the corona radiata bilaterally is worse right than left. Remote lacunar infarcts are present in the caudate head bilaterally. Remote lacunar infarcts are present in the right lentiform nucleus. The brainstem and cerebellum are within normal limits. Insert normal IAC's Vascular: Flow is present in the major intracranial arteries. Skull and upper cervical spine: The craniocervical junction is normal. Upper cervical spine is within normal limits. Marrow signal is unremarkable. Sinuses/Orbits: Posterior right sphenoid sinus opacification is present. There may be a central polyp as well. There is some fluid in the mastoid air cells bilaterally, right greater than left. The paranasal sinuses and mastoid air cells are otherwise clear. Bilateral lens replacements are noted. Globes and orbits are otherwise unremarkable. IMPRESSION: 1. Two acute punctate nonhemorrhagic cortical infarcts of the right frontal lobe, anterior to the primary motor cortex. 2. Area of T2 signal change involving the posterior right temporal lobe and inferior right parietal lobe consistent with late subacute or early chronic infarct. There is no acute infarcts in this region. Question ischemic event within the last 2-3 months? 3. Stable remote lacunar infarcts of the basal ganglia bilaterally, right greater than left. 4. Chronic right sphenoid sinus disease. Electronically Signed   By: San Morelle M.D.   On: 07/20/2019 18:23      Allergies as of 07/21/2019      Reactions   Oxycodone Nausea Only   Lisinopril Swelling   Tetanus Toxoids Swelling      Medication List    TAKE these medications   acetaminophen 325 MG tablet Commonly known as:  TYLENOL Place 650 mg into feeding tube every 6 (six) hours as needed.   amLODipine 10 MG tablet Commonly known as: NORVASC Place 10 mg into feeding tube daily.   carvedilol 25 MG tablet Commonly known as: COREG Place 25 mg into feeding tube 2 (two) times daily with a meal.   Eliquis 5 MG Tabs tablet Generic drug: apixaban Place 5 mg into feeding tube 2 (two) times daily.   hydrALAZINE 10 MG tablet Commonly known as: APRESOLINE Take 10 mg by mouth Nightly.   ISOSOURCE PO Take by mouth 5 (five) times daily.   losartan 100 MG tablet Commonly known as: COZAAR Take 100 mg by mouth daily.   multivitamin-lutein Caps capsule Take 1 capsule by mouth daily.   scopolamine 1 MG/3DAYS Commonly known as: TRANSDERM-SCOP Place 1 patch onto the skin every 3 (three) days as needed.   senna 8.6 MG tablet Commonly known as:  [place]  Take 1 tablet by mouth 2 (two) times daily as needed for constipation.   simvastatin 40 MG tablet Commonly known as: ZOCOR Take 40 mg by mouth at bedtime.   Vitamin D (Ergocalciferol) 1.25 MG (50000 UT) Caps capsule Commonly known as: DRISDOL Take 50,000 Units by mouth every 7 (seven) days.         Management plans discussed with the patient and he is in agreement. Stable for discharge home  Patient should follow up with neurology  CODE STATUS:     Code Status Orders  (From admission, onward)         Start     Ordered   07/20/19 1252  Full code  Continuous     07/20/19 1254        Code Status History    Date Active Date Inactive Code Status Order ID Comments User Context   05/10/2018 1501 05/11/2018 2129 Full Code [id number]  Hillary Bow, MD ED   Advance Care Planning Activity    Advance Directive Documentation     Most Recent Value  Type of Advance Directive  Healthcare Power of [place], Living will  Pre-existing out of facility DNR order (yellow form or pink MOST form)  -  "MOST" Form in Place?  -      TOTAL TIME TAKING CARE  OF THIS PATIENT: 38 minutes.    Note: This dictation was prepared with Dragon dictation along with smaller phrase technology. Any transcriptional errors that result from this process are unintentional.  Bettey Costa M.D on 07/21/2019 at 9:55 AM  Between 7am to 6pm - Pager - [phone] After 6pm go to [email or web address] - password [email or web address]  Office  [phone]  CC: Primary care physician; Baxter Hire, MD

## 2019-07-21 NOTE — Consult Note (Signed)
Reason for Consult: transient aphasia  Referring Physician: Dr. Benjie Karvonen   CC: aphasia transient   HPI: Nathaniel Gamble is an 83 y.o. male  past medical history of hypertension, hyperlipidemia, CAD s/p CABG, AAA, diabetes mellitus type 2, CKD stage III, left ICA stenosis, prior right watershed distribution infarcts in 2019, and recent multiple punctate ischemic stroke of the right frontal and parietal lobes (05/2019) presenting to the ED with chief complaints of slurred speech and left facial droop. Symptoms are transient and patient is back to baseline.  Has chronic LUE weakness. MRI with R frontal strokes that are likely subacute as were known there in July  Past Medical History:  Diagnosis Date  . Abdominal aortic aneurysm ([place])    monitored by dr. Ubaldo Glassing  . Abdominal aortic aneurysm ([place])   . Cancer ([place]) 1976   melanoma, under right arm, removed  . Chronic kidney disease 2011   stent right kidney  . Coronary artery disease   . Diabetes mellitus without complication ([place])   . Hypertension   . Jaw fracture ([place])   . Myocardial infarction ([place])   . Peripheral vascular disease ([place])   . PONV (postoperative nausea and vomiting)   . Renal artery stenosis [facility])     Past Surgical History:  Procedure Laterality Date  . CATARACT EXTRACTION Bilateral 2014  . CORONARY ARTERY BYPASS GRAFT  2010  . INGUINAL HERNIA REPAIR Left 07/09/2015   Procedure: HERNIA REPAIR INGUINAL ADULT;  Surgeon: Leonie Green, MD;  Location: [facility] ORS;  Service: General;  Laterality: Left;  . LOOP RECORDER INSERTION N/A 06/28/2018   Procedure: LOOP RECORDER INSERTION;  Surgeon: Isaias Cowman, MD;  Location: [place] [facility];  Service: Cardiovascular;  Laterality: N/A;  . MANDIBLE FRACTURE SURGERY Left   . RENAL ARTERY STENT Right     History reviewed. No pertinent family history.  Social History:  reports that he has quit smoking. He has quit using smokeless tobacco. He reports current alcohol use  of about 1.0 standard drinks of alcohol per week. He reports that he does not use drugs.  Allergies  Allergen Reactions  . Oxycodone Nausea Only  . Lisinopril Swelling  . Tetanus Toxoids Swelling    Medications: I have reviewed the patient's current medications.  ROS: History obtained from the patient  General ROS: negative for - chills, fatigue, fever, night sweats, weight gain or weight loss Psychological ROS: negative for - behavioral disorder, hallucinations, memory difficulties, mood swings or suicidal ideation Ophthalmic ROS: negative for - blurry vision, double vision, eye pain or loss of vision ENT ROS: negative for - epistaxis, nasal discharge, oral lesions, sore throat, tinnitus or vertigo Allergy and Immunology ROS: negative for - hives or itchy/watery eyes Hematological and Lymphatic ROS: negative for - bleeding problems, bruising or swollen lymph nodes Endocrine ROS: negative for - galactorrhea, hair pattern changes, polydipsia/polyuria or temperature intolerance Respiratory ROS: negative for - cough, hemoptysis, shortness of breath or wheezing Cardiovascular ROS: negative for - chest pain, dyspnea on exertion, edema or irregular heartbeat Gastrointestinal ROS: negative for - abdominal pain, diarrhea, hematemesis, nausea/vomiting or stool incontinence Genito-Urinary ROS: negative for - dysuria, hematuria, incontinence or urinary frequency/urgency Musculoskeletal ROS: negative for - joint swelling or muscular weakness Neurological ROS: as noted in HPI Dermatological ROS: negative for rash and skin lesion changes  Physical Examination: Blood pressure 132/80, pulse 73, temperature (!) 97 F (36.1 C), resp. rate 16, height 5\' 11"  (1.803 m), weight 79.4 kg, SpO2 99 %.  Neurological Examination   Mental Status: Alert, oriented, thought content appropriate.  Speech fluent without evidence of aphasia.  Able to follow 3 step commands without difficulty. Cranial Nerves: II:  Discs flat bilaterally; Visual fields grossly normal, pupils equal, round, reactive to light and accommodation III,IV, VI: ptosis not present, extra-ocular motions intact bilaterally V,VII: smile symmetric, facial light touch sensation normal bilaterally VIII: hearing normal bilaterally IX,X: gag reflex present XI: bilateral shoulder shrug XII: midline tongue extension Motor: Right : Upper extremity   5/5    Left:     Upper extremity   5/5 4+/5 distally with weak hand grip   Lower extremity   5/5     Lower extremity   5/5 Tone and bulk:normal tone throughout; no atrophy noted Sensory: Pinprick and light touch intact throughout, bilaterally Deep Tendon Reflexes: 2+ and symmetric throughout Plantars: Right: downgoing   Left: downgoing Cerebellar: normal finger-to-nose, normal rapid alternating movements and normal heel-to-shin test Gait: not tested    Laboratory Studies:   Basic Metabolic Panel: Recent Labs  Lab 07/20/19 0931  NA 140  K 4.9  CL 101  CO2 27  GLUCOSE 109*  BUN 48*  CREATININE 1.46*  CALCIUM 9.9    Liver Function Tests: Recent Labs  Lab 07/20/19 0931  AST 18  ALT 23  ALKPHOS 89  BILITOT 1.3*  PROT 7.2  ALBUMIN 4.1   No results for input(s): LIPASE, AMYLASE in the last 168 hours. No results for input(s): AMMONIA in the last 168 hours.  CBC: Recent Labs  Lab 07/20/19 0931  WBC 7.1  NEUTROABS 4.7  HGB 12.4*  HCT 38.4*  MCV 93.4  PLT 126*    Cardiac Enzymes: No results for input(s): CKTOTAL, CKMB, CKMBINDEX, TROPONINI in the last 168 hours.  BNP: Invalid input(s): POCBNP  CBG: Recent Labs  Lab 07/20/19 0929 07/20/19 1655 07/20/19 2135 07/21/19 0814  GLUCAP 102* 93 103* 90    Microbiology: Results for orders placed or performed during the hospital encounter of 07/20/19  SARS Coronavirus 2 [facility] order, Performed in [facility] hospital lab) Nasopharyngeal Nasopharyngeal Swab     Status: None   Collection Time: 07/20/19 12:43  PM   Specimen: Nasopharyngeal Swab  Result Value Ref Range Status   SARS Coronavirus 2 NEGATIVE NEGATIVE Final    Comment: (NOTE) If result is NEGATIVE SARS-CoV-2 target nucleic acids are NOT DETECTED. The SARS-CoV-2 RNA is generally detectable in upper and lower  respiratory specimens during the acute phase of infection. The lowest  concentration of SARS-CoV-2 viral copies this assay can detect is 250  copies / mL. A negative result does not preclude SARS-CoV-2 infection  and should not be used as the sole basis for treatment or other  patient management decisions.  A negative result may occur with  improper specimen collection / handling, submission of specimen other  than nasopharyngeal swab, presence of viral mutation(s) within the  areas targeted by this assay, and inadequate number of viral copies  (<250 copies / mL). A negative result must be combined with clinical  observations, patient history, and epidemiological information. If result is POSITIVE SARS-CoV-2 target nucleic acids are DETECTED. The SARS-CoV-2 RNA is generally detectable in upper and lower  respiratory specimens dur ing the acute phase of infection.  Positive  results are indicative of active infection with SARS-CoV-2.  Clinical  correlation with patient history and other diagnostic information is  necessary to determine patient infection status.  Positive results do  not rule out bacterial  infection or co-infection with other viruses. If result is PRESUMPTIVE POSTIVE SARS-CoV-2 nucleic acids MAY BE PRESENT.   A presumptive positive result was obtained on the submitted specimen  and confirmed on repeat testing.  While 2019 novel coronavirus  (SARS-CoV-2) nucleic acids may be present in the submitted sample  additional confirmatory testing may be necessary for epidemiological  and / or clinical management purposes  to differentiate between  SARS-CoV-2 and other Sarbecovirus currently known to infect humans.   If clinically indicated additional testing with an alternate test  methodology [phone]) is advised. The SARS-CoV-2 RNA is generally  detectable in upper and lower respiratory sp ecimens during the acute  phase of infection. The expected result is Negative. Fact Sheet for Patients:  StrictlyIdeas.no Fact Sheet for Healthcare Providers: BankingDealers.co.za This test is not yet approved or cleared by the [place] FDA and has been authorized for detection and/or diagnosis of SARS-CoV-2 by FDA under an Emergency Use Authorization (EUA).  This EUA will remain in effect (meaning this test can be used) for the duration of the COVID-19 declaration under Section 564(b)(1) of the Act, 21 U.S.C. section 360bbb-3(b)(1), unless the authorization is terminated or revoked sooner. Performed at [facility], [address] [place], [place] [address]     Coagulation Studies: Recent Labs    07/20/19 0931  LABPROT 17.1*  INR 1.4*    Urinalysis:  Recent Labs  Lab 07/20/19 1114  COLORURINE YELLOW*  LABSPEC 1.013  PHURINE 7.0  GLUCOSEU NEGATIVE  HGBUR NEGATIVE  BILIRUBINUR NEGATIVE  KETONESUR NEGATIVE  PROTEINUR NEGATIVE  NITRITE NEGATIVE  LEUKOCYTESUR NEGATIVE    Lipid Panel:     Component Value Date/Time   CHOL 149 07/21/2019 0631   TRIG 129 07/21/2019 0631   HDL 49 07/21/2019 0631   CHOLHDL 3.0 07/21/2019 0631   VLDL 26 07/21/2019 0631   LDLCALC 74 07/21/2019 0631    HgbA1C:  Lab Results  Component Value Date   HGBA1C 5.2 07/21/2019    Urine Drug Screen:      Component Value Date/Time   LABOPIA NONE DETECTED 07/20/2019 1114   COCAINSCRNUR NONE DETECTED 07/20/2019 1114   LABBENZ NONE DETECTED 07/20/2019 1114   AMPHETMU NONE DETECTED 07/20/2019 1114   THCU NONE DETECTED 07/20/2019 1114   LABBARB NONE DETECTED 07/20/2019 1114    Alcohol Level:  Recent Labs  Lab 07/20/19 0931  ETH <10    Other  results: A fib rate controlled   Imaging: Ct Head Wo Contrast  Result Date: 07/20/2019 CLINICAL DATA:  TIA, initial exam. EXAM: CT HEAD WITHOUT CONTRAST TECHNIQUE: Contiguous axial images were obtained from the base of the skull through the vertex without intravenous contrast. COMPARISON:  06/04/2019 FINDINGS: Brain: Low-density in the posterior right temporal and lower parietal cortex and subjacent white matter most likely a recent infarct given the cytotoxic pattern. There has been a small remote posterior right frontal cortex infarct. Remote left caudate head infarct. Brain atrophy most prominent in the posterior frontal and parietal regions. No acute hemorrhage, hydrocephalus, or masslike finding. Vascular: Atherosclerotic calcification. Skull: Negative Sinuses/Orbits: Negative IMPRESSION: 1. Moderate area of edema in the posterior right temporal and low parietal region that is most consistent with infarct. 2. Small remote right posterior frontal cortex and caudate infarcts. 3. Frontoparietal atrophy. Electronically Signed   By: Monte Fantasia M.D.   On: 07/20/2019 11:12   Mr Brain Wo Contrast  Result Date: 07/20/2019 CLINICAL DATA:  Stroke, follow-up. Slurred speech and difficulty swallowing beginning at  8:20 a.m. today. EXAM: MRI HEAD WITHOUT CONTRAST TECHNIQUE: Multiplanar, multiecho pulse sequences of the brain and surrounding structures were obtained without intravenous contrast. COMPARISON:  CT head without contrast 07/20/2019 FINDINGS: Brain: 2 punctate foci of restricted diffusion are present in the right frontal lobe in the pre frontal area. There is a more diffuse focus of T2 hyperintensity involving the posterior right temporal lobe and inferior right parietal lobe as noted on CT. This results in T2 shine through on the diffusion-weighted images. No restricted diffusion is present in this region. Extensive white matter disease extending into the corona radiata bilaterally is worse right than  left. Remote lacunar infarcts are present in the caudate head bilaterally. Remote lacunar infarcts are present in the right lentiform nucleus. The brainstem and cerebellum are within normal limits. Insert normal IAC's Vascular: Flow is present in the major intracranial arteries. Skull and upper cervical spine: The craniocervical junction is normal. Upper cervical spine is within normal limits. Marrow signal is unremarkable. Sinuses/Orbits: Posterior right sphenoid sinus opacification is present. There may be a central polyp as well. There is some fluid in the mastoid air cells bilaterally, right greater than left. The paranasal sinuses and mastoid air cells are otherwise clear. Bilateral lens replacements are noted. Globes and orbits are otherwise unremarkable. IMPRESSION: 1. Two acute punctate nonhemorrhagic cortical infarcts of the right frontal lobe, anterior to the primary motor cortex. 2. Area of T2 signal change involving the posterior right temporal lobe and inferior right parietal lobe consistent with late subacute or early chronic infarct. There is no acute infarcts in this region. Question ischemic event within the last 2-3 months? 3. Stable remote lacunar infarcts of the basal ganglia bilaterally, right greater than left. 4. Chronic right sphenoid sinus disease. Electronically Signed   By: San Morelle M.D.   On: 07/20/2019 18:23     Assessment/Plan:  83 y.o. male  past medical history of hypertension, hyperlipidemia, CAD s/p CABG, AAA, diabetes mellitus type 2, CKD stage III, left ICA stenosis, prior right watershed distribution infarcts in 2019, and recent multiple punctate ischemic stroke of the right frontal and parietal lobes (05/2019) presenting to the ED with chief complaints of slurred speech and left facial droop. Symptoms are transient and patient is back to baseline.  Has chronic LUE weakness. MRI with R frontal strokes that are likely subacute as were known there in July  -  Back to his usual state - Imaging as stated above - Con't eliquis and d/c planning today - The cortical frontal infarcts I suspect were there prior.   07/21/2019, 10:36 AM

## 2019-07-21 NOTE — TOC Transition Note (Signed)
Transition of Care [facility]) - CM/SW Discharge Note   Patient Details  Name: Nathaniel Gamble MRN: [id number] Date of Birth: March 03, 1936  [facility] CM/SW Contact:  Latanya Maudlin, RN Phone Number: 07/21/2019, 10:36 AM   Clinical Narrative:  Patient to be discharged per MD order. Orders in place for home health services. CMS Medicare.gov Compare Post Acute Care list reviewed with patient and he is active with [facility], wishes to resume. Notified Kendra of [place]. No DME needs. Son to transport.      Final next level of care: Home w Home Health Services Barriers to Discharge: No Barriers Identified   Patient Goals and CMS Choice   CMS Medicare.gov Compare Post Acute Care list provided to:: Patient Choice offered to / list presented to : Patient  Discharge Placement                       Discharge Plan and Services                          HH Arranged: RN, PT [facility] Agency: [facility] Date [facility] Agency Contacted: 07/21/19 Time [place]: [id number] Representative spoke with at [place]: [place] ([place]) Interventions     Readmission Risk Interventions Readmission Risk Prevention Plan 07/21/2019  Transportation Screening Complete  PCP or Specialist Appt within 5-7 Days Complete  Home Care Screening Complete  Medication Review (RN CM) Complete  Some recent data might be hidden

## 2019-07-25 ENCOUNTER — Telehealth (INDEPENDENT_AMBULATORY_CARE_PROVIDER_SITE_OTHER): Payer: Self-pay | Admitting: Vascular Surgery

## 2019-07-25 DIAGNOSIS — I480 Paroxysmal atrial fibrillation: Secondary | ICD-10-CM | POA: Insufficient documentation

## 2019-07-25 NOTE — Telephone Encounter (Signed)
Patient son calling stating that patient has had several strokes recently and his AAA is measuring at 5.1cm and they are requesting apt with our office. I spoke with Yvetta Coder to see if a referral has to be sent or if we can just schedule patient because he is a current standing patient for [place]. I was advised that patient would need a referral sent from doctor that advised follow up. Patient son said that was Cardiologist. He said he would contact them and let them know to send the referral so that we can schedule. AS< CMA

## 2019-08-16 ENCOUNTER — Ambulatory Visit: Payer: BC Managed Care – PPO

## 2019-08-19 ENCOUNTER — Other Ambulatory Visit: Payer: Self-pay

## 2019-08-19 ENCOUNTER — Ambulatory Visit
Admission: RE | Admit: 2019-08-19 | Discharge: 2019-08-19 | Disposition: A | Discharge status: Home / Self Care | Payer: BC Managed Care – PPO | Source: Ambulatory Visit | Attending: Gastroenterology | Admitting: Gastroenterology

## 2019-08-19 DIAGNOSIS — R1310 Dysphagia, unspecified: Secondary | ICD-10-CM

## 2019-08-19 DIAGNOSIS — R634 Abnormal weight loss: Secondary | ICD-10-CM | POA: Diagnosis present

## 2019-08-19 DIAGNOSIS — R1312 Dysphagia, oropharyngeal phase: Secondary | ICD-10-CM

## 2019-08-19 DIAGNOSIS — Z931 Gastrostomy status: Secondary | ICD-10-CM

## 2019-08-19 NOTE — Therapy (Signed)
Subjective: Patient behavior: (alertness, ability to follow instructions, etc.): Pt was alert, pleasant and cooperative Chief complaint: Assessment of appropriateness for po intake given severe dysphagia after CVA. PEG placement   Objective:  Radiological Procedure: A videoflouroscopic evaluation of oral-preparatory, reflex initiation, and pharyngeal phases of the swallow was performed; as well as a screening of the upper esophageal phase.  I. POSTURE: upright in chair II. VIEW: Lateral III. COMPENSATORY STRATEGIES: cued dry swallow IV. BOLUSES ADMINISTERED:  Thin Liquid: cup and straw boluses  Nectar-thick Liquid: cup sips  Puree:  Dry cracker V. RESULTS OF EVALUATION: A. ORAL PREPARATORY PHASE: (The lips, tongue, and velum are observed for strength and coordination)       **Overall Severity Rating: WFL = Slight premature spill over tongue base and piecemeal swallow. Pt attributes this to fear of choking.  B. SWALLOW INITIATION/REFLEX: (The reflex is normal if "triggered" by the time the bolus reached the base of the tongue)  **Overall Severity Rating: - Mild impairment. Reflex trigger at the pyriform sinus on thin liquid boluses. Trigger noted at Vallecular Sinus on nectar thick, puree, and solid textures  C. PHARYNGEAL PHASE: (Pharyngeal function is normal if the bolus shows rapid, smooth, and continuous transit through the pharynx and there is no pharyngeal residue after the swallow)  **Overall Severity Rating: Mild - slight vallecular and pyriform sinus residue which pt usually sensed and swallowed again independently. Cued dry swallow also effective if pt not aware of trace residue.  D. LARYNGEAL PENETRATION: (Material entering into the laryngeal inlet/vestibule but not aspirated) One episode of trace flash penetration was seen during large boluses of thin liquid. Penetrate cleared spontaneously and was not aspirated. Penetration was not seen again during this study despite multiple  presentations of thin liquid via cup and straw. E. ASPIRATION: Not seen F. ESOPHAGEAL PHASE: (Screening of the upper esophagus) WFL  ASSESSMENT: Pt presents with significantly improved oropharyngeal swallow function, as compared to result of FEES completed August 2020.   Oral stage appears within functional limits, with minimal oral residue and piecemeal swallow across consistencies.   Pharyngeal stage is mildly impaired, with reflex trigger at the level of the pyriform sinus on thin liquids, and at the vallecular sinus on all other textures. Trace vallecular and pyriform sinus residue noted after the swallow, which cleared with independent (or cued if needed) dry swallow. One episode of trace flash penetration was noted during the swallow of consecutive thin liquid boluses. Penetrate cleared spontaneously and completly, and was not aspirated. No other episodes of penetration were seen despite multiple presentations via cup and straw.   Recommend advancing diet to regular solids and thin liquids via cup or straw, swallow 2x per bite/sip. Meds whole with liquid. Results and recommendations were reviewed with pt following this study, and he was given written safe swallow strategies to maximize safety and minimize aspiraiton risk.  Pt asked when the PEG tube could be removed - he was encouraged to contact his PCP and/or referring provider regarding removal of feeding tube.     PLAN/RECOMMENDATIONS:  A. Diet: Regular/Thin liquids via cup or straw  B. Swallowing Precautions: swallow 2x occasionally  C. Recommended consultation to PCP or referring provider regarding timing of removal of PEG tube.  D. Therapy recommendations: No follow up recommended at this time.  E. Results and recommendations were discussed with pt following this study. Written safe swallow strategies were reviewed and provided in written form.  Mayo Faulk B. Quentin Ore, [place], [place] Speech Language Pathologist Office: [phone]

## 2019-08-27 ENCOUNTER — Encounter (INDEPENDENT_AMBULATORY_CARE_PROVIDER_SITE_OTHER): Payer: Self-pay

## 2019-08-27 ENCOUNTER — Encounter (INDEPENDENT_AMBULATORY_CARE_PROVIDER_SITE_OTHER): Payer: Self-pay | Admitting: Vascular Surgery

## 2019-08-27 ENCOUNTER — Other Ambulatory Visit: Payer: Self-pay

## 2019-08-27 ENCOUNTER — Ambulatory Visit (INDEPENDENT_AMBULATORY_CARE_PROVIDER_SITE_OTHER): Payer: BC Managed Care – PPO | Admitting: Vascular Surgery

## 2019-08-27 VITALS — BP 123/65 | HR 64 | Resp 16 | Wt 172.0 lb

## 2019-08-27 DIAGNOSIS — I713 Abdominal aortic aneurysm, ruptured, unspecified: Secondary | ICD-10-CM

## 2019-08-27 DIAGNOSIS — E785 Hyperlipidemia, unspecified: Secondary | ICD-10-CM | POA: Diagnosis not present

## 2019-08-27 DIAGNOSIS — I6523 Occlusion and stenosis of bilateral carotid arteries: Secondary | ICD-10-CM

## 2019-08-27 DIAGNOSIS — I639 Cerebral infarction, unspecified: Secondary | ICD-10-CM

## 2019-08-27 DIAGNOSIS — I10 Essential (primary) hypertension: Secondary | ICD-10-CM

## 2019-08-27 DIAGNOSIS — I714 Abdominal aortic aneurysm, without rupture, unspecified: Secondary | ICD-10-CM

## 2019-08-27 NOTE — Assessment & Plan Note (Signed)
blood pressure control important in reducing the progression of atherosclerotic disease and AAA growth. On appropriate oral medications.  

## 2019-08-27 NOTE — Patient Instructions (Signed)
Abdominal Aortic Aneurysm  An aneurysm is a bulge in one of the blood vessels that carry blood away from the heart (artery). It happens when blood pushes up against a weak or damaged place in the wall of an artery. An abdominal aortic aneurysm happens in the main artery of the body (aorta). Some aneurysms may not cause problems. If it grows, it can burst or tear, causing bleeding inside the body. This is an emergency. It needs to be treated right away. What are the causes? The exact cause of this condition is not known. What increases the risk? The following may make you more likely to get this condition:  Being a male who is 60 years of age or older.  Being white (Caucasian).  Using tobacco.  Having a family history of aneurysms.  Having the following conditions: ? Hardening of the arteries (arteriosclerosis). ? Inflammation of the walls of an artery (arteritis). ? Certain genetic conditions. ? Being very overweight (obesity). ? An infection in the wall of the aorta (infectious aortitis). ? High cholesterol. ? High blood pressure (hypertension). What are the signs or symptoms? Symptoms depend on the size of the aneurysm and how fast it is growing. Most grow slowly and do not cause any symptoms. If symptoms do occur, they may include:  Pain in the belly (abdomen), side, or back.  Feeling full after eating only small amounts of food.  Feeling a throbbing lump in the belly. Symptoms that the aneurysm has burst (ruptured) include:  Sudden, very bad pain in the belly, side, or back.  Feeling sick to your stomach (nauseous).  Throwing up (vomiting).  Feeling light-headed or passing out. How is this treated? Treatment for this condition depends on:  The size of the aneurysm.  How fast it is growing.  Your age.  Your risk of having it burst. If your aneurysm is smaller than 2 inches (5 cm), your doctor may manage it by:  Checking it often to see if it is getting bigger.  You may have an imaging test (ultrasound) to check it every 3-6 months, every year, or every few years.  Giving you medicines to: ? Control blood pressure. ? Treat pain. ? Fight infection. If your aneurysm is larger than 2 inches (5 cm), you may need surgery to fix it. Follow these instructions at home: Lifestyle  Do not use any products that have nicotine or tobacco in them. This includes cigarettes, e-cigarettes, and chewing tobacco. If you need help quitting, ask your doctor.  Get regular exercise. Ask your doctor what types of exercise are best for you. Eating and drinking  Eat a heart-healthy diet. This includes eating plenty of: ? Fresh fruits and vegetables. ? Whole grains. ? Low-fat (lean) protein. ? Low-fat dairy products.  Avoid foods that are high in saturated fat and cholesterol. These foods include red meat and some dairy products.  Do not drink alcohol if: ? Your doctor tells you not to drink. ? You are pregnant, may be pregnant, or are planning to become pregnant.  If you drink alcohol: ? Limit how much you use to:  0-1 drink a day for women.  0-2 drinks a day for men. ? Be aware of how much alcohol is in your drink. In the [place] one drink equals any of these:  One typical bottle of beer (12 oz).  One-half glass of wine (5 oz).  One shot of hard liquor (1 oz). General instructions  Take over-the-counter and prescription medicines only as   told by your doctor.  Keep your blood pressure within normal limits. Ask your doctor what your blood pressure should be.  Have your blood sugar (glucose) level and cholesterol levels checked regularly. Keep your blood sugar level and cholesterol levels within normal limits.  Avoid heavy lifting and activities that take a lot of effort. Ask your doctor what activities are safe for you.  Keep all follow-up visits as told by your doctor. This is important. ? Talk to your doctor about regular screenings to see if the  aneurysm is getting bigger. Contact a doctor if you:  Have pain in your belly, side, or back.  Have a throbbing feeling in your belly.  Have a family history of aneurysms. Get help right away if you:  Have sudden, bad pain in your belly, side, or back.  Feel sick to your stomach.  Throw up.  Have trouble pooping (constipation).  Have trouble peeing (urinating).  Feel light-headed.  Have a fast heart rate when you stand.  Have sweaty skin that is cold to the touch (clammy).  Have shortness of breath.  Have a fever. These symptoms may be an emergency. Do not wait to see if the symptoms will go away. Get medical help right away. Call your local emergency services (911 in the [place] Do not drive yourself to the hospital. Summary  An aneurysm is a bulge in one of the blood vessels that carry blood away from the heart (artery). Some aneurysms may not cause problems.  You may need to have yours checked often. If it grows, it can burst or tear. This causes bleeding inside the body. It needs to be treated right away.  Follow instructions from your doctor about healthy lifestyle changes.  Keep all follow-up visits as told by your doctor. This is important. This information is not intended to replace advice given to you by your health care provider. Make sure you discuss any questions you have with your health care provider. Document Released: 02/25/2013 Document Revised: 02/18/2019 Document Reviewed: 06/09/2018 [employer] Patient Education  2020 [employer]  

## 2019-08-27 NOTE — Assessment & Plan Note (Signed)
Checked earlier this year and stable 

## 2019-08-27 NOTE — Progress Notes (Signed)
Patient ID: Nathaniel Gamble, male   DOB: December 10, 1935, 83 y.o.   MRN: [id number]  Chief Complaint  Patient presents with  . Follow-up    AAA 5.1cm    HPI Nathaniel Gamble is a 83 y.o. male.  I am asked to see the patient for evaluation of an enlarged aneurysm now measuring approximately 5.3 cm in maximal diameter per the son's report.  This was seen on a CT scan in August when he had stroke and multiple other issues.  He ultimately had to have a feeding tube placed as he was unable to eat.  He is now back to eating and hoping to get the feeding tube removed in the next month or so.  I do not have the images or the report of the CT scan that was done at [facility] but according to the GI doctors note it measured 5.1 cm in maximal diameter.  Either way, this is a significant increase as he was in the 4-1/2 range previously.  His only abdominal pain at current is from the feeding tube itself.  No back pain or signs of peripheral embolization.     Past Medical History:  Diagnosis Date  . Abdominal aortic aneurysm ([place])    monitored by dr. Ubaldo Glassing  . Abdominal aortic aneurysm ([place])   . Cancer ([place]) 1976   melanoma, under right arm, removed  . Chronic kidney disease 2011   stent right kidney  . Coronary artery disease   . Diabetes mellitus without complication ([place])   . Hypertension   . Jaw fracture ([place])   . Myocardial infarction ([place])   . Peripheral vascular disease ([place])   . PONV (postoperative nausea and vomiting)   . Renal artery stenosis [facility])     Past Surgical History:  Procedure Laterality Date  . CATARACT EXTRACTION Bilateral 2014  . CORONARY ARTERY BYPASS GRAFT  2010  . INGUINAL HERNIA REPAIR Left 07/09/2015   Procedure: HERNIA REPAIR INGUINAL ADULT;  Surgeon: Leonie Green, MD;  Location: [facility] ORS;  Service: General;  Laterality: Left;  . LOOP RECORDER INSERTION N/A 06/28/2018   Procedure: LOOP RECORDER INSERTION;  Surgeon: Isaias Cowman, MD;  Location: [place]  CV LAB;  Service: Cardiovascular;  Laterality: N/A;  . MANDIBLE FRACTURE SURGERY Left   . RENAL ARTERY STENT Right     Family History Family History  Problem Relation Age of Onset  . Hyperlipidemia Mother   . Stroke Mother   . Hyperlipidemia Father     Social History Social History   Tobacco Use  . Smoking status: Former Research scientist (life sciences)  . Smokeless tobacco: Former Network engineer Use Topics  . Alcohol use: Yes    Alcohol/week: 1.0 standard drinks    Types: 1 Glasses of wine per week  . Drug use: No    Allergies  Allergen Reactions  . Oxycodone Nausea Only  . Lisinopril Swelling  . Tetanus Toxoids Swelling    Current Outpatient Medications  Medication Sig Dispense Refill  . acetaminophen (TYLENOL) 325 MG tablet Place 650 mg into feeding tube every 6 (six) hours as needed.    [name] amLODipine (NORVASC) 10 MG tablet Place 10 mg into feeding tube daily.     [name] apixaban (ELIQUIS) 5 MG TABS tablet Place 5 mg into feeding tube 2 (two) times daily.    . Baclofen 5 MG TABS Take by mouth as needed.    . carvedilol (COREG) 25 MG tablet Place 25 mg into feeding tube 2 (  two) times daily with a meal.     . hydrALAZINE (APRESOLINE) 10 MG tablet Take 10 mg by mouth Nightly.    [name] losartan (COZAAR) 100 MG tablet Take 100 mg by mouth daily.     . multivitamin-lutein (OCUVITE-LUTEIN) CAPS capsule Take 1 capsule by mouth daily.    [name] scopolamine (TRANSDERM-SCOP) 1 MG/3DAYS Place 1 patch onto the skin every 3 (three) days as needed.    . simvastatin (ZOCOR) 40 MG tablet Take 40 mg by mouth at bedtime.    . Vitamin D, Ergocalciferol, (DRISDOL) 50000 units CAPS capsule Take 50,000 Units by mouth once a week.   1  . Nutritional Supplements (ISOSOURCE PO) Take by mouth 5 (five) times daily.    [name] senna (SENOKOT) 8.6 MG tablet Take 1 tablet by mouth 2 (two) times daily as needed for constipation.     No current facility-administered medications for this visit.       REVIEW OF SYSTEMS (Negative unless  checked)  Constitutional: [] Weight loss  [] Fever  [] Chills Cardiac: [] Chest pain   [] Chest pressure   [] Palpitations   [] Shortness of breath when laying flat   [] Shortness of breath at rest   [] Shortness of breath with exertion. Vascular:  [] Pain in legs with walking   [] Pain in legs at rest   [] Pain in legs when laying flat   [] Claudication   [] Pain in feet when walking  [] Pain in feet at rest  [] Pain in feet when laying flat   [] History of DVT   [] Phlebitis   [] Swelling in legs   [] Varicose veins   [] Non-healing ulcers Pulmonary:   [] Uses home oxygen   [] Productive cough   [] Hemoptysis   [] Wheeze  [] COPD   [] Asthma Neurologic:  [] Dizziness  [] Blackouts   [] Seizures   [x] History of stroke   [] History of TIA  [] Aphasia   [] Temporary blindness   [x] Dysphagia   [] Weakness or numbness in arms   [] Weakness or numbness in legs Musculoskeletal:  [] Arthritis   [] Joint swelling   [] Joint pain   [] Low back pain Hematologic:  [] Easy bruising  [] Easy bleeding   [] Hypercoagulable state   [] Anemic  [] Hepatitis Gastrointestinal:  [] Blood in stool   [] Vomiting blood  [x] Gastroesophageal reflux/heartburn   [] Abdominal pain Genitourinary:  [] Chronic kidney disease   [] Difficult urination  [] Frequent urination  [] Burning with urination   [] Hematuria Skin:  [] Rashes   [] Ulcers   [] Wounds Psychological:  [] History of anxiety   []  History of major depression.    Physical Exam BP 123/65 (BP Location: Right Arm)   Pulse 64   Resp 16   Wt 172 lb (78 kg)   BMI 23.99 kg/m  Gen:  WD/WN, NAD Head: [place]/AT, No temporalis wasting.  Ear/Nose/Throat: Hearing grossly intact, nares w/o erythema or drainage, oropharynx w/o Erythema/Exudate Eyes: Conjunctiva clear, sclera non-icteric  Neck: trachea midline.  No JVD.  Pulmonary:  Good air movement, respirations not labored, no use of accessory muscles  Cardiac: RRR, no JVD Vascular:  Vessel Right Left  Radial Palpable Palpable                                    Gastrointestinal:. No masses, surgical incisions, or scars. Musculoskeletal: M/S 5/5 throughout.  Extremities without ischemic changes.  No deformity or atrophy. Trace LE edema. Neurologic: Sensation grossly intact in extremities.  Symmetrical.  Speech is a bit labored and choppy. Motor exam as listed above.  Psychiatric: Judgment intact, Mood & affect appropriate for pt's clinical situation. Dermatologic: No rashes or ulcers noted.  No cellulitis or open wounds.    Radiology Dg Carlena Hurl [facility] [employer] Speech Path  Result Date: 08/19/2019 CLINICAL DATA:  Dysphagia. EXAM: MODIFIED BARIUM SWALLOW TECHNIQUE: Different consistencies of barium were administered orally to the patient by the Speech Pathologist. Imaging of the pharynx was performed in the lateral projection. The radiologist was present in the fluoroscopy room for this study, providing personal supervision. FLUOROSCOPY TIME:  Fluoroscopy Time:  1.9 minute Radiation Exposure Index (if provided by the fluoroscopic device): 2.9 mGy Number of Acquired Spot Images: 0 COMPARISON:  None. FINDINGS: Real-time fluoroscopy was utilized for evaluation of the swallow function with a speech pathologist present. Multiple consistencies of barium were administered which included thin, nectar, applesauce and cracker consistencies. Single episode of laryngeal penetration without tracheal aspiration with thin liquid. No focal abnormality while swallowing through a straw. Mild premature spillage. Otherwise no significant focal abnormality. IMPRESSION: Modified barium swallow as described above. Please refer to the Speech Pathologists report for complete details and recommendations. Electronically Signed   By: Kathreen Devoid   On: 08/19/2019 13:52 Objective Swallowing Evaluation: Type of Study: MBS-Modified Barium Swallow Study  Patient Details Name: KIARI DENLEY MRN: [id number] Date of Birth: September 06, 1936 Today's Date: 08/19/2019 Time: 71 Past Medical History: Past  Medical History: Diagnosis Date . Abdominal aortic aneurysm ([place])   monitored by dr. Ubaldo Glassing . Abdominal aortic aneurysm ([place])  . Cancer ([place]) 1976  melanoma, under right arm, removed . Chronic kidney disease 2011  stent right kidney . Coronary artery disease  . Diabetes mellitus without complication ([place])  . Hypertension  . Jaw fracture ([place])  . Myocardial infarction ([place])  . Peripheral vascular disease ([place])  . PONV (postoperative nausea and vomiting)  . Renal artery stenosis [facility])  Past Surgical History: Past Surgical History: Procedure Laterality Date . CATARACT EXTRACTION Bilateral 2014 . CORONARY ARTERY BYPASS GRAFT  2010 . INGUINAL HERNIA REPAIR Left 07/09/2015  Procedure: HERNIA REPAIR INGUINAL ADULT;  Surgeon: Leonie Green, MD;  Location: [facility] ORS;  Service: General;  Laterality: Left; . LOOP RECORDER INSERTION N/A 06/28/2018  Procedure: LOOP RECORDER INSERTION;  Surgeon: Isaias Cowman, MD;  Location: [place] CV LAB;  Service: Cardiovascular;  Laterality: N/A; . MANDIBLE FRACTURE SURGERY Left  . RENAL ARTERY STENT Right  HPI: 83 year old male referred for outpatient MBS to assess readiness for po intake after CVA and severe dysphagia with PEG tube placement  Subjective: Pt was seen in radiology for outpatient MBS Assessment / Plan / Recommendation CHL IP CLINICAL IMPRESSIONS 08/19/2019 Clinical Impression Pt presents with significantly improved oropharyngeal swallow function, as compared to result of FEES completed August 2020. Oral stage appears within functional limits, with minimal oral residue and piecemeal swallow across consistencies. Pharyngeal stage is mildly impaired, with reflex trigger at the level of the pyriform sinus on thin liquids, and at the vallecular sinus on all other textures. Trace vallecular and pyriform sinus residue noted after the swallow, which cleared with independent (or cued if needed) dry swallow. One episode of trace flash penetration was noted during the  swallow of consecutive thin liquid boluses. Penetrate cleared spontaneously and completly, and was not aspirated. No other episodes of penetration were seen despite multiple presentations via cup and straw. Recommend advancing diet to regular solids and thin liquids via cup or straw, swallow 2x per bite/sip. Meds whole with liquid. Results and recommendations were reviewed with  pt following this study, and he was given written safe swallow strategies to maximize safety and minimize aspiraiton risk. Pt asked when the PEG tube could be removed - he was encouraged to contact his PCP and/or referring provider regarding removal of feeding tube.  SLP Visit Diagnosis Dysphagia, pharyngeal phase (R13.13) Impact on safety and function Mild aspiration risk   CHL IP TREATMENT RECOMMENDATION 08/19/2019 Treatment Recommendations No treatment recommended at this time   Prognosis 08/19/2019 Prognosis for Safe Diet Advancement Good CHL IP DIET RECOMMENDATION 08/19/2019 SLP Diet Recommendations Regular solids;Thin liquid Liquid Administration via Cup;Straw Medication Administration Whole meds with liquid Compensations Minimize environmental distractions;Slow rate;Small sips/bites;Multiple dry swallows after each bite/sip Postural Changes Remain semi-upright after after feeds/meals (Comment);Seated upright at 90 degrees   CHL IP OTHER RECOMMENDATIONS 08/19/2019 Oral Care Recommendations Oral care BID   CHL IP FOLLOW UP RECOMMENDATIONS 08/19/2019 Follow up Recommendations Pt asked when the PEG tube could be removed - he was encouraged to contact his PCP and/or referring provider regarding removal of feeding tube.      CHL IP ORAL PHASE 08/19/2019 Oral Phase Slight oral residue and premature spill over tongue base, otherwise WFL  CHL IP PHARYNGEAL PHASE 08/19/2019 Pharyngeal Phase Impaired Pharyngeal- Nectar Cup/Straw Delayed swallow initiation-vallecula;Pharyngeal residue - valleculae;Pharyngeal residue - pyriform Pharyngeal- Thin Cup/Straw  Delayed swallow initiation-pyriform sinuses;Reduced airway/laryngeal closure - one episode of trace flash penetration during large consecutive boluses. Pharyngeal residue - valleculae;Pharyngeal residue - pyriform Pharyngeal- Puree Delayed swallow initiation-vallecula;Pharyngeal residue - valleculae;Pharyngeal residue - pyriform Pharyngeal- Regular Delayed swallow initiation-vallecula;Pharyngeal residue - valleculae;Pharyngeal residue - pyriform  CHL IP CERVICAL ESOPHAGEAL PHASE 08/19/2019 Cervical Esophageal Phase [facility]ia [facility]. Qu[facility], [facility], [place] Speech Language Pathologist Office: [phone] Shonna Chock 08/19/2019, 2:14 PM               Labs Recent Results (from the past 2160 hour(s))  CBC with Differential     Status: Abnormal   Collection Time: 06/02/19  9:30 PM  Result Value Ref Range   WBC 12.5 (H) 4.0 - 10.5 K/uL   RBC 4.62 4.22 - 5.81 MIL/uL   Hemoglobin 14.0 13.0 - 17.0 g/dL   HCT 41.6 39.0 - 52.0 %   MCV 90.0 80.0 - 100.0 fL   MCH 30.3 26.0 - 34.0 pg   MCHC 33.7 30.0 - 36.0 g/dL   RDW 14.3 11.5 - 15.5 %   Platelets 117 (L) 150 - 400 K/uL    Comment: REPEATED TO VERIFY SPECIMEN CHECKED FOR CLOTS    nRBC 0.0 0.0 - 0.2 %   Neutrophils Relative % 74 %   Neutro Abs 9.5 (H) 1.7 - 7.7 K/uL   Lymphocytes Relative 16 %   Lymphs Abs 2.0 0.7 - 4.0 K/uL   Monocytes Relative 7 %   Monocytes Absolute 0.8 0.1 - 1.0 K/uL   Eosinophils Relative 1 %   Eosinophils Absolute 0.1 0.0 - 0.5 K/uL   Basophils Relative 1 %   Basophils Absolute 0.1 0.0 - 0.1 K/uL   Immature Granulocytes 1 %   Abs Immature Granulocytes 0.09 (H) 0.00 - 0.07 K/uL    Comment: Performed at [facility], [address], [place], [place] [id number]  Basic metabolic panel     Status: Abnormal   Collection Time: 06/02/19  9:30 PM  Result Value Ref Range   Sodium 134 (L) 135 - 145 mmol/L   Potassium 4.2 3.5 - 5.1 mmol/L   Chloride 103 98 - 111 mmol/L   CO2 20 (  L) 22 - 32 mmol/L   Glucose, Bld 150 (H)  70 - 99 mg/dL   BUN 41 (H) 8 - 23 mg/dL   Creatinine, Ser 1.58 (H) 0.61 - 1.24 mg/dL   Calcium 8.7 (L) 8.9 - 10.3 mg/dL   GFR calc non Af Amer 41 (L) >60 mL/min   GFR calc Af Amer 47 (L) >60 mL/min   Anion gap 11 5 - 15    Comment: Performed at [facility], [address] [place], [place] [address]  Glucose, capillary     Status: Abnormal   Collection Time: 07/20/19  9:29 AM  Result Value Ref Range   Glucose-Capillary 102 (H) 70 - 99 mg/dL  Ethanol     Status: None   Collection Time: 07/20/19  9:31 AM  Result Value Ref Range   Alcohol, Ethyl (B) <10 <10 mg/dL    Comment: (NOTE) Lowest detectable limit for serum alcohol is 10 mg/dL. For medical purposes only. Performed at [facility], [address] [place], [place] [address]   Protime-INR     Status: Abnormal   Collection Time: 07/20/19  9:31 AM  Result Value Ref Range   Prothrombin Time 17.1 (H) 11.4 - 15.2 seconds   INR 1.4 (H) 0.8 - 1.2    Comment: (NOTE) INR goal varies based on device and disease states. Performed at [facility], [address] [place], [place] [address]   APTT     Status: None   Collection Time: 07/20/19  9:31 AM  Result Value Ref Range   aPTT 31 24 - 36 seconds    Comment: Performed at [facility], [address] [place], [place] [address]  CBC     Status: Abnormal   Collection Time: 07/20/19  9:31 AM  Result Value Ref Range   WBC 7.1 4.0 - 10.5 K/uL   RBC 4.11 (L) 4.22 - 5.81 MIL/uL   Hemoglobin 12.4 (L) 13.0 - 17.0 g/dL   HCT 38.4 (L) 39.0 - 52.0 %   MCV 93.4 80.0 - 100.0 fL   MCH 30.2 26.0 - 34.0 pg   MCHC 32.3 30.0 - 36.0 g/dL   RDW 15.8 (H) 11.5 - 15.5 %   Platelets 126 (L) 150 - 400 K/uL   nRBC 0.0 0.0 - 0.2 %    Comment: Performed at [facility], [address] [place], [place] [address]  Differential     Status: None   Collection Time: 07/20/19  9:31 AM  Result Value Ref Range   Neutrophils Relative % 66 %   Neutro Abs  4.7 1.7 - 7.7 K/uL   Lymphocytes Relative 23 %   Lymphs Abs 1.7 0.7 - 4.0 K/uL   Monocytes Relative 9 %   Monocytes Absolute 0.6 0.1 - 1.0 K/uL   Eosinophils Relative 1 %   Eosinophils Absolute 0.1 0.0 - 0.5 K/uL   Basophils Relative 1 %   Basophils Absolute 0.0 0.0 - 0.1 K/uL   Immature Granulocytes 0 %   Abs Immature Granulocytes 0.02 0.00 - 0.07 K/uL    Comment: Performed at [facility], [address] [place], [place] [address]  Comprehensive metabolic panel     Status: Abnormal   Collection Time: 07/20/19  9:31 AM  Result Value Ref Range   Sodium 140 135 - 145 mmol/L   Potassium 4.9 3.5 - 5.1 mmol/L   Chloride 101 98 - 111 mmol/L   CO2 27 22 - 32 mmol/L  Glucose, Bld 109 (H) 70 - 99 mg/dL   BUN 48 (H) 8 - 23 mg/dL   Creatinine, Ser 1.46 (H) 0.61 - 1.24 mg/dL   Calcium 9.9 8.9 - 10.3 mg/dL   Total Protein 7.2 6.5 - 8.1 g/dL   Albumin 4.1 3.5 - 5.0 g/dL   AST 18 15 - 41 U/L   ALT 23 0 - 44 U/L   Alkaline Phosphatase 89 38 - 126 U/L   Total Bilirubin 1.3 (H) 0.3 - 1.2 mg/dL   GFR calc non Af Amer 45 (L) >60 mL/min   GFR calc Af Amer 52 (L) >60 mL/min   Anion gap 12 5 - 15    Comment: Performed at [facility], [address], [place], [place] [address]  Urine Drug Screen, Qualitative     Status: None   Collection Time: 07/20/19 11:14 AM  Result Value Ref Range   Tricyclic, Ur Screen NONE DETECTED NONE DETECTED   Amphetamines, Ur Screen NONE DETECTED NONE DETECTED   MDMA (Ecstasy)Ur Screen NONE DETECTED NONE DETECTED   Cocaine Metabolite,Ur [place] NONE DETECTED NONE DETECTED   Opiate, Ur Screen NONE DETECTED NONE DETECTED   Phencyclidine (PCP) Ur S NONE DETECTED NONE DETECTED   Cannabinoid 50 Ng, Ur [place] NONE DETECTED NONE DETECTED   Barbiturates, Ur Screen NONE DETECTED NONE DETECTED   Benzodiazepine, Ur Scrn NONE DETECTED NONE DETECTED   Methadone Scn, Ur NONE DETECTED NONE DETECTED    Comment: (NOTE) Tricyclics + metabolites, urine    Cutoff 1000  ng/mL Amphetamines + metabolites, urine  Cutoff 1000 ng/mL MDMA (Ecstasy), urine              Cutoff 500 ng/mL Cocaine Metabolite, urine          Cutoff 300 ng/mL Opiate + metabolites, urine        Cutoff 300 ng/mL Phencyclidine (PCP), urine         Cutoff 25 ng/mL Cannabinoid, urine                 Cutoff 50 ng/mL Barbiturates + metabolites, urine  Cutoff 200 ng/mL Benzodiazepine, urine              Cutoff 200 ng/mL Methadone, urine                   Cutoff 300 ng/mL The urine drug screen provides only a preliminary, unconfirmed analytical test result and should not be used for non-medical purposes. Clinical consideration and professional judgment should be applied to any positive drug screen result due to possible interfering substances. A more specific alternate chemical method must be used in order to obtain a confirmed analytical result. Gas chromatography / mass spectrometry (GC/MS) is the preferred confirmat ory method. Performed at [facility], [address] [place], [place] [address]   Urinalysis, Complete w Microscopic     Status: Abnormal   Collection Time: 07/20/19 11:14 AM  Result Value Ref Range   Color, Urine YELLOW (A) YELLOW   APPearance CLEAR (A) CLEAR   Specific Gravity, Urine 1.013 1.005 - 1.030   pH 7.0 5.0 - 8.0   Glucose, UA NEGATIVE NEGATIVE mg/dL   Hgb urine dipstick NEGATIVE NEGATIVE   Bilirubin Urine NEGATIVE NEGATIVE   Ketones, ur NEGATIVE NEGATIVE mg/dL   Protein, ur NEGATIVE NEGATIVE mg/dL   Nitrite NEGATIVE NEGATIVE   Leukocytes,Ua NEGATIVE NEGATIVE   RBC / HPF 0-5 0 - 5 RBC/hpf   WBC, UA 0-5 0 - 5 WBC/hpf  Bacteria, UA NONE SEEN NONE SEEN   Squamous Epithelial / LPF NONE SEEN 0 - 5   Mucus PRESENT     Comment: Performed at [facility], [address] [place], [place] [address]  SARS Coronavirus 2 [facility] order, Performed in [facility] hospital lab) Nasopharyngeal Nasopharyngeal Swab     Status: None   Collection  Time: 07/20/19 12:43 PM   Specimen: Nasopharyngeal Swab  Result Value Ref Range   SARS Coronavirus 2 NEGATIVE NEGATIVE    Comment: (NOTE) If result is NEGATIVE SARS-CoV-2 target nucleic acids are NOT DETECTED. The SARS-CoV-2 RNA is generally detectable in upper and lower  respiratory specimens during the acute phase of infection. The lowest  concentration of SARS-CoV-2 viral copies this assay can detect is 250  copies / mL. A negative result does not preclude SARS-CoV-2 infection  and should not be used as the sole basis for treatment or other  patient management decisions.  A negative result may occur with  improper specimen collection / handling, submission of specimen other  than nasopharyngeal swab, presence of viral mutation(s) within the  areas targeted by this assay, and inadequate number of viral copies  (<250 copies / mL). A negative result must be combined with clinical  observations, patient history, and epidemiological information. If result is POSITIVE SARS-CoV-2 target nucleic acids are DETECTED. The SARS-CoV-2 RNA is generally detectable in upper and lower  respiratory specimens dur ing the acute phase of infection.  Positive  results are indicative of active infection with SARS-CoV-2.  Clinical  correlation with patient history and other diagnostic information is  necessary to determine patient infection status.  Positive results do  not rule out bacterial infection or co-infection with other viruses. If result is PRESUMPTIVE POSTIVE SARS-CoV-2 nucleic acids MAY BE PRESENT.   A presumptive positive result was obtained on the submitted specimen  and confirmed on repeat testing.  While 2019 novel coronavirus  (SARS-CoV-2) nucleic acids may be present in the submitted sample  additional confirmatory testing may be necessary for epidemiological  and / or clinical management purposes  to differentiate between  SARS-CoV-2 and other Sarbecovirus currently known to infect  humans.  If clinically indicated additional testing with an alternate test  methodology [phone]) is advised. The SARS-CoV-2 RNA is generally  detectable in upper and lower respiratory sp ecimens during the acute  phase of infection. The expected result is Negative. Fact Sheet for Patients:  StrictlyIdeas.no Fact Sheet for Healthcare Providers: BankingDealers.co.za This test is not yet approved or cleared by the [place] FDA and has been authorized for detection and/or diagnosis of SARS-CoV-2 by FDA under an Emergency Use Authorization (EUA).  This EUA will remain in effect (meaning this test can be used) for the duration of the COVID-19 declaration under Section 564(b)(1) of the Act, 21 U.S.C. section 360bbb-3(b)(1), unless the authorization is terminated or revoked sooner. Performed at [facility], [address] [place], [place] [address]   Hemoglobin A1c     Status: None   Collection Time: 07/20/19  2:58 PM  Result Value Ref Range   Hgb A1c MFr Bld 5.2 4.8 - 5.6 %    Comment: (NOTE) Pre diabetes:          5.7%-6.4% Diabetes:              >6.4% Glycemic control for   <7.0% adults with diabetes    Mean Plasma Glucose 102.54 mg/dL    Comment: Performed at [facility],  [address] [place], [place] [address]  Glucose, capillary     Status: None   Collection Time: 07/20/19  4:55 PM  Result Value Ref Range   Glucose-Capillary 93 70 - 99 mg/dL   Comment 1 Document in Chart   Glucose, capillary     Status: Abnormal   Collection Time: 07/20/19  9:35 PM  Result Value Ref Range   Glucose-Capillary 103 (H) 70 - 99 mg/dL  Hemoglobin A1c     Status: None   Collection Time: 07/21/19  6:31 AM  Result Value Ref Range   Hgb A1c MFr Bld 5.2 4.8 - 5.6 %    Comment: (NOTE) Pre diabetes:          5.7%-6.4% Diabetes:              >6.4% Glycemic control for   <7.0% adults with diabetes    Mean Plasma Glucose  102.54 mg/dL    Comment: Performed at [facility], [place] [address], [place], [place] [address]  Lipid panel     Status: None   Collection Time: 07/21/19  6:31 AM  Result Value Ref Range   Cholesterol 149 0 - 200 mg/dL   Triglycerides 129 <150 mg/dL   HDL 49 >40 mg/dL   Total CHOL/HDL Ratio 3.0 RATIO   VLDL 26 0 - 40 mg/dL   LDL Cholesterol 74 0 - 99 mg/dL    Comment:        Total Cholesterol/HDL:CHD Risk Coronary Heart Disease Risk Table                     Men   Women  1/2 Average Risk   3.4   3.3  Average Risk       5.0   4.4  2 X Average Risk   9.6   7.1  3 X Average Risk  23.4   11.0        Use the calculated Patient Ratio above and the CHD Risk Table to determine the patient's CHD Risk.        ATP III CLASSIFICATION (LDL):  <100     mg/dL   Optimal  100-129  mg/dL   Near or Above                    Optimal  130-159  mg/dL   Borderline  160-189  mg/dL   High  >190     mg/dL   Very High Performed at [facility], [address] [place],  [address]   Glucose, capillary     Status: None   Collection Time: 07/21/19  8:14 AM  Result Value Ref Range   Glucose-Capillary 90 70 - 99 mg/dL  Glucose, capillary     Status: None   Collection Time: 07/21/19 12:05 PM  Result Value Ref Range   Glucose-Capillary 86 70 - 99 mg/dL    Assessment/Plan:  Hypertension blood pressure control important in reducing the progression of atherosclerotic disease and AAA growth. On appropriate oral medications.   Dyslipidemia lipid control important in reducing the progression of atherosclerotic disease. Continue statin therapy   Carotid stenosis Checked earlier this year and stable  Abdominal aortic aneurysm ([place]) He has a longstanding abdominal aortic aneurysm but this is now demonstrated significant growth and is over 5 cm in maximal diameter.  I would agree with trying to get his feeding tube out which should happen in the next month to 6 weeks.  It has been a  couple of months already since his CT scan, so I am going to repeat a CT angiogram in 6 to 8 weeks and see the patient back to discuss aneurysm repair at that time.      Leotis Pain 08/27/2019, 9:22 AM   This note was created with Dragon medical transcription system.  Any errors from dictation are unintentional.

## 2019-08-27 NOTE — Assessment & Plan Note (Signed)
He has a longstanding abdominal aortic aneurysm but this is now demonstrated significant growth and is over 5 cm in maximal diameter.  I would agree with trying to get his feeding tube out which should happen in the next month to 6 weeks.  It has been a couple of months already since his CT scan, so I am going to repeat a CT angiogram in 6 to 8 weeks and see the patient back to discuss aneurysm repair at that time.

## 2019-08-27 NOTE — Assessment & Plan Note (Signed)
lipid control important in reducing the progression of atherosclerotic disease. Continue statin therapy  

## 2019-09-27 ENCOUNTER — Other Ambulatory Visit: Payer: Self-pay

## 2019-09-27 ENCOUNTER — Ambulatory Visit
Admission: RE | Admit: 2019-09-27 | Discharge: 2019-09-27 | Disposition: A | Discharge status: Home / Self Care | Payer: BC Managed Care – PPO | Source: Ambulatory Visit | Attending: Vascular Surgery | Admitting: Vascular Surgery

## 2019-09-27 DIAGNOSIS — I713 Abdominal aortic aneurysm, ruptured, unspecified: Secondary | ICD-10-CM

## 2019-09-27 MED ORDER — IOHEXOL 350 MG/ML SOLN
75.0000 mL | Freq: Once | INTRAVENOUS | Status: AC | PRN
  Administered 2019-09-27: 10:00:00 75 mL via INTRAVENOUS

## 2019-10-08 ENCOUNTER — Other Ambulatory Visit: Payer: Self-pay

## 2019-10-08 ENCOUNTER — Encounter (INDEPENDENT_AMBULATORY_CARE_PROVIDER_SITE_OTHER): Payer: Self-pay | Admitting: Nurse Practitioner

## 2019-10-08 ENCOUNTER — Ambulatory Visit (INDEPENDENT_AMBULATORY_CARE_PROVIDER_SITE_OTHER): Payer: BC Managed Care – PPO | Admitting: Nurse Practitioner

## 2019-10-08 VITALS — BP 138/69 | HR 61 | Resp 16 | Wt 171.6 lb

## 2019-10-08 DIAGNOSIS — I739 Peripheral vascular disease, unspecified: Secondary | ICD-10-CM

## 2019-10-08 DIAGNOSIS — I10 Essential (primary) hypertension: Secondary | ICD-10-CM

## 2019-10-08 DIAGNOSIS — I639 Cerebral infarction, unspecified: Secondary | ICD-10-CM | POA: Diagnosis not present

## 2019-10-08 DIAGNOSIS — I714 Abdominal aortic aneurysm, without rupture, unspecified: Secondary | ICD-10-CM

## 2019-10-15 DIAGNOSIS — N1832 Chronic kidney disease, stage 3b: Secondary | ICD-10-CM | POA: Insufficient documentation

## 2019-10-15 DIAGNOSIS — R899 Unspecified abnormal finding in specimens from other organs, systems and tissues: Secondary | ICD-10-CM | POA: Insufficient documentation

## 2019-10-16 ENCOUNTER — Encounter (INDEPENDENT_AMBULATORY_CARE_PROVIDER_SITE_OTHER): Payer: Self-pay | Admitting: Nurse Practitioner

## 2019-10-16 NOTE — Progress Notes (Signed)
SUBJECTIVE:  Patient ID: Nathaniel Gamble, male    DOB: November 21, 1935, 83 y.o.   MRN: [id number] Chief Complaint  Patient presents with   Follow-up    ct results    HPI  Nathaniel Gamble is a 83 y.o. male the presents today to follow-up on CT angiogram for his abdominal aortic aneurysm.  The patient's son is also present today.  The patient was initially asked to see [place] after it was noted that his aneurysm had grown to 5.3 cm after being noted on CT scan after his stroke in August.  Today, the patient is doing much better following his stroke.  The patient initially had a PEG tube however at this time his eating has progressed and now it should be removed soon.  Per the son it will be removed within the next 2 to 3 weeks.  The patient denies any abdominal pain or signs symptoms of peripheral embolization.  He denies any fever, chills, nausea, vomiting or diarrhea.  He denies any claudication-like symptoms.  He denies any lower extremity wounds or ulcerations.  He denies any rest pain like symptoms.  The patient just underwent a new CT angiogram on 09/27/2019.  The abdominal aneurysm measures 5.5 cm at the greatest point just below the level of the IMA region.  It extends to the aortic bifurcation and is approximately 4 cm in height.  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  There is aneurysmal disease within the bilateral common iliac arteries with the left greater than the right.  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  There is proximal occlusion of the bilateral SFAs.  There is also aneurysmal disease of the bilateral proximal profunda femoral arteries.   Past Medical History:  Diagnosis Date   Abdominal aortic aneurysm ([place])    monitored by dr. Ubaldo Glassing     Abdominal aortic aneurysm ([place])    Cancer ([place]) 1976   melanoma, under right arm, removed   Chronic kidney disease 2011   stent right kidney   Coronary artery disease    Diabetes mellitus without complication ([place])    Hypertension    Jaw fracture ([place])    Myocardial infarction ([place])    Peripheral vascular disease ([place])    PONV (postoperative nausea and vomiting)    Renal artery stenosis [facility])     Past Surgical History:  Procedure Laterality Date   CATARACT EXTRACTION Bilateral 2014   CORONARY ARTERY BYPASS GRAFT  2010   INGUINAL HERNIA REPAIR Left 07/09/2015   Procedure: HERNIA REPAIR INGUINAL ADULT;  Surgeon: Leonie Green, MD;  Location: [facility] ORS;  Service: General;  Laterality: Left;   LOOP RECORDER INSERTION N/A 06/28/2018   Procedure: LOOP RECORDER INSERTION;  Surgeon: Isaias Cowman, MD;  Location: [facility];  Service: Cardiovascular;  Laterality: N/A;   MANDIBLE FRACTURE SURGERY Left    RENAL ARTERY STENT Right     Social History   Socioeconomic History   Marital status: Married    Spouse name: Not on file   Number of children: Not on file   Years of education: Not on file   Highest education level: Not on file  Occupational History   Not on file  Social Needs  Financial resource strain: Not hard at all   Food insecurity    Worry: Never true    Inability: Never true   Transportation needs    Medical: Not on file    Non-medical: Not on file  Tobacco Use   Smoking status: Former Smoker   Smokeless tobacco: Former Systems developer  Substance and Sexual Activity   Alcohol use: Yes    Alcohol/week: 1.0 standard drinks    Types: 1 Glasses of wine per week   Drug use: No   Sexual activity: Not on file  Lifestyle   Physical activity    Days per week: Not on file    Minutes per session: Not on file   Stress: Not on file  Relationships   Social connections    Talks on phone: Not on file    Gets together: Not on  file    Attends religious service: Not on file    Active member of club or organization: Not on file    Attends meetings of clubs or organizations: Not on file    Relationship status: Not on file   Intimate partner violence    Fear of current or ex partner: Not on file    Emotionally abused: Not on file    Physically abused: Not on file    Forced sexual activity: Not on file  Other Topics Concern   Not on file  Social History Narrative   Not on file    Family History  Problem Relation Age of Onset   Hyperlipidemia Mother    Stroke Mother    Hyperlipidemia Father     Allergies  Allergen Reactions   Oxycodone Nausea Only   Lisinopril Swelling   Tetanus Toxoids Swelling     Review of Systems   Review of Systems: Negative Unless Checked Constitutional: [] Weight loss  [] Fever  [] Chills Cardiac: [] Chest pain   []  Atrial Fibrillation  [] Palpitations   [] Shortness of breath when laying flat   [] Shortness of breath with exertion. [] Shortness of breath at rest Vascular:  [] Pain in legs with walking   [] Pain in legs with standing [] Pain in legs when laying flat   [] Claudication    [] Pain in feet when laying flat    [] History of DVT   [] Phlebitis   [] Swelling in legs   [] Varicose veins   [] Non-healing ulcers Pulmonary:   [] Uses home oxygen   [] Productive cough   [] Hemoptysis   [] Wheeze  [] COPD   [] Asthma Neurologic:  [] Dizziness   [] Seizures  [] Blackouts [x] History of stroke   [] History of TIA  [] Aphasia   [] Temporary Blindness   [x] Weakness or numbness in arm   [] Weakness or numbness in leg Musculoskeletal:   [] Joint swelling   [] Joint pain   [] Low back pain  []  History of Knee Replacement [x] Arthritis [] back Surgeries  []  Spinal Stenosis    Hematologic:  [] Easy bruising  [] Easy bleeding   [] Hypercoagulable state   [] Anemic Gastrointestinal:  [] Diarrhea   [] Vomiting  [] Gastroesophageal reflux/heartburn   [] Difficulty swallowing. [] Abdominal pain Genitourinary:  [] Chronic  kidney disease   [] Difficult urination  [] Anuric   [] Blood in urine [] Frequent urination  [] Burning with urination   [] Hematuria Skin:  [] Rashes   [] Ulcers [] Wounds Psychological:  [] History of anxiety   []  History of major depression  []  Memory Difficulties      OBJECTIVE:   Physical Exam  BP 138/69 (BP Location: Right Arm)    Pulse 61    Resp 16    Wt 171  lb 9.6 oz (77.8 kg)    BMI 23.93 kg/m   Gen: WD/WN, NAD Head: [place]/AT, No temporalis wasting.  Ear/Nose/Throat: Hearing grossly intact, nares w/o erythema or drainage Eyes: PER, EOMI, sclera nonicteric.  Neck: Supple, no masses.  No JVD.  Pulmonary:  Good air movement, no use of accessory muscles.  Cardiac: RRR Vascular:  Vessel Right Left  Radial Palpable Palpable  Dorsalis Pedis  trace palpable  trace palpable  Posterior Tibial  trace palpable  trace palpable   Gastrointestinal: soft, non-distended. No guarding/no peritoneal signs.  Musculoskeletal: M/S 5/5 throughout.  No deformity or atrophy.  Neurologic: Pain and light touch intact in extremities.  Symmetrical.    Speech is slow but understandable.  Motor exam as listed above. Psychiatric: Judgment intact, Mood & affect appropriate for pt's clinical situation. Dermatologic: No Venous rashes. No Ulcers Noted.  No changes consistent with cellulitis. Lymph : No Cervical lymphadenopathy, no lichenification or skin changes of chronic lymphedema.       ASSESSMENT AND PLAN:  1. Abdominal aortic aneurysm (AAA) without rupture ([place]) Recommend: The aneurysm is > 5 cm and therefore should undergo repair. Patient is status post CT scan of the abdominal aorta. The patient is a candidate for endovascular repair.   He will require cardiac clearance.   The patient will continue antiplatelet therapy as prescribed (since the patient is undergoing endovascular repair as opposed to open repair) as well as aggressive management of hyperlipidemia. Exercise is again strongly encouraged.    The patient is reminded that lifetime routine surveillance is a necessity with an endograft.   The risks and benefits of AAA repair are reviewed with the patient.  All questions are answered.  Alternative therapies are also discussed.  The patient agrees to proceed with endovascular aneurysm repair.  Patient will follow-up with me in the office after the surgery.  2. Peripheral arterial disease ([place]) At this time the patient does have evidence of extensive diffuse peripheral arterial disease.  However, he currently is not symptomatic nor does he have any limb threatening symptoms which would indicate that we need to intervene sooner on his peripheral arterial disease.  Trying to repair his peripheral arterial disease in addition to providing his aortic aneurysm would likely be too much of a surgical process given his recent stroke.  Repairing his abdominal aortic aneurysm is much more of a priority at this time.  We will still be able to repair any issues that may develop with peripheral arterial disease however he just may require a different access.  This was discussed with the patient's son.  3. Essential hypertension Continue antihypertensive medications as already ordered, these medications have been reviewed and there are no changes at this time.   4. Acute CVA (cerebrovascular accident) ([place]) The patient's eating has progressed well enough to where the patient can have his PEG tube removed.  Due to the fact that having a large opening in his abdomen increases the risk for infection following stent graft placement.  Therefore it would be prudent to place the stent graft for the abdominal aortic aneurysm after the PEG tube removal.  This is due to the fact that any infection that may possibly become introduced to the graft could be potentially fatal especially given his age and other comorbidities.  We will plan to perform surgery 10 to 14 days after its removal.   Current Outpatient  Medications on File Prior to Visit  Medication Sig Dispense Refill   amLODipine (NORVASC) 10 MG  tablet Place 10 mg into feeding tube daily.      apixaban (ELIQUIS) 5 MG TABS tablet Place 5 mg into feeding tube 2 (two) times daily.     carvedilol (COREG) 25 MG tablet Place 25 mg into feeding tube 2 (two) times daily with a meal.      hydrALAZINE (APRESOLINE) 10 MG tablet Take 10 mg by mouth Nightly.     losartan (COZAAR) 100 MG tablet Take 100 mg by mouth daily.      multivitamin-lutein (OCUVITE-LUTEIN) CAPS capsule Take 1 capsule by mouth daily.     simvastatin (ZOCOR) 40 MG tablet Take 40 mg by mouth at bedtime.     Vitamin D, Ergocalciferol, (DRISDOL) 50000 units CAPS capsule Take 50,000 Units by mouth once a week.   1   acetaminophen (TYLENOL) 325 MG tablet Place 650 mg into feeding tube every 6 (six) hours as needed.     Baclofen 5 MG TABS Take by mouth as needed.     Nutritional Supplements (ISOSOURCE PO) Take by mouth 5 (five) times daily.     scopolamine (TRANSDERM-SCOP) 1 MG/3DAYS Place 1 patch onto the skin every 3 (three) days as needed.     senna (SENOKOT) 8.6 MG tablet Take 1 tablet by mouth 2 (two) times daily as needed for constipation.     No current facility-administered medications on file prior to visit.     There are no Patient Instructions on file for this visit. No follow-ups on file.   Kris Hartmann, NP  This note was completed with Sales executive.  Any errors are purely unintentional.

## 2019-11-12 DIAGNOSIS — R809 Proteinuria, unspecified: Secondary | ICD-10-CM | POA: Insufficient documentation

## 2019-11-12 DIAGNOSIS — N2581 Secondary hyperparathyroidism of renal origin: Secondary | ICD-10-CM | POA: Insufficient documentation

## 2019-11-12 DIAGNOSIS — I129 Hypertensive chronic kidney disease with stage 1 through stage 4 chronic kidney disease, or unspecified chronic kidney disease: Secondary | ICD-10-CM | POA: Insufficient documentation

## 2019-11-19 ENCOUNTER — Telehealth (INDEPENDENT_AMBULATORY_CARE_PROVIDER_SITE_OTHER): Payer: Self-pay

## 2019-11-20 ENCOUNTER — Telehealth (INDEPENDENT_AMBULATORY_CARE_PROVIDER_SITE_OTHER): Payer: Self-pay

## 2019-11-20 NOTE — Telephone Encounter (Signed)
Spoke with the patient's son and explained that I have made contact with [facility] regarding cardiac clearance and I am waiting on them to clear the patient.

## 2019-11-20 NOTE — Telephone Encounter (Signed)
Patient's son Nathaniel Gamble left a message regarding getting a surgery scheduled for the patient. I contacted [facility] about the cardiac clearance that was faxed over on 10/21/2019 and a message was taken to give to the nurse for a callback regarding this patient. This information was given to the son and we are now waiting on a clearance from [facility].

## 2019-12-10 ENCOUNTER — Ambulatory Visit (INDEPENDENT_AMBULATORY_CARE_PROVIDER_SITE_OTHER): Payer: BC Managed Care – PPO

## 2019-12-10 ENCOUNTER — Encounter (INDEPENDENT_AMBULATORY_CARE_PROVIDER_SITE_OTHER): Payer: Self-pay | Admitting: Vascular Surgery

## 2019-12-10 ENCOUNTER — Other Ambulatory Visit: Payer: Self-pay

## 2019-12-10 ENCOUNTER — Ambulatory Visit (INDEPENDENT_AMBULATORY_CARE_PROVIDER_SITE_OTHER): Payer: BC Managed Care – PPO | Admitting: Vascular Surgery

## 2019-12-10 VITALS — BP 145/66 | HR 55 | Resp 16 | Wt 173.0 lb

## 2019-12-10 DIAGNOSIS — I714 Abdominal aortic aneurysm, without rupture, unspecified: Secondary | ICD-10-CM

## 2019-12-10 DIAGNOSIS — I10 Essential (primary) hypertension: Secondary | ICD-10-CM | POA: Diagnosis not present

## 2019-12-10 DIAGNOSIS — E785 Hyperlipidemia, unspecified: Secondary | ICD-10-CM | POA: Diagnosis not present

## 2019-12-10 DIAGNOSIS — I6523 Occlusion and stenosis of bilateral carotid arteries: Secondary | ICD-10-CM | POA: Diagnosis not present

## 2019-12-10 DIAGNOSIS — E119 Type 2 diabetes mellitus without complications: Secondary | ICD-10-CM | POA: Diagnosis not present

## 2019-12-10 NOTE — Assessment & Plan Note (Signed)
blood glucose control important in reducing the progression of atherosclerotic disease. Also, involved in wound healing. On appropriate medications.  

## 2019-12-10 NOTE — Patient Instructions (Signed)
Abdominal Aortic Aneurysm Endograft Repair  Abdominal aortic aneurysm endograft repair is a surgery to fix an aortic aneurysm in the abdominal area. An aneurysm is a weak or damaged part of an artery wall that bulges out from the normal force of blood pumping through the body. An abdominal aortic aneurysm is an aneurysm that happens in the lower part of the aorta, which is the main artery of the body. The repair is often done if the aneurysm gets so large that it might burst (rupture). A ruptured aneurysm would cause bleeding inside the body that could put a person's life in danger. Before that happens, this procedure is needed to fix the problem. The procedure may also be done if the aneurysm causes symptoms such as pain in the back, abdomen, or side. In this procedure, a tube made of fabric and metal mesh (endograft or stent-graft) is placed in the weak part of the aorta to repair it. Tell a health care provider about:  Any allergies you have.  All medicines you are taking, including vitamins, herbs, eye drops, creams, and over-the-counter medicines.  Any problems you or family members have had with anesthetic medicines.  Any blood disorders you have.  Any surgeries you have had.  Any medical conditions you have.  Whether you are pregnant or may be pregnant. What are the risks? Generally, this is a safe procedure. However, problems may occur, including:  Infection of the graft or incision area.  Bleeding during the procedure or from the incision site.  Allergic reactions to medicines.  Damage to other structures or organs.  Blood leaking out around the endograft.  The endograft moving from where it was placed during surgery.  Blood flow through the graft becoming blocked.  Blood clots.  Kidney problems.  Blood flow to the legs becoming blocked (rare).  Rupture of the aorta even after the endograft repair is a success (rare). What happens before the procedure? Staying  hydrated Follow instructions from your health care provider about hydration, which may include:  Up to 2 hours before the procedure - you may continue to drink clear liquids, such as water, clear fruit juice, black coffee, and plain tea. Eating and drinking restrictions Follow instructions from your health care provider about eating and drinking, which may include:  8 hours before the procedure - stop eating heavy meals or foods such as meat, fried foods, or fatty foods.  6 hours before the procedure - stop eating light meals or foods, such as toast or cereal.  6 hours before the procedure - stop drinking milk or drinks that contain milk.  2 hours before the procedure - stop drinking clear liquids. Medicines  Ask your health care provider about: ? Changing or stopping your regular medicines. This is especially important if you are taking diabetes medicines or blood thinners. ? Taking medicines such as aspirin and ibuprofen. These medicines can thin your blood. Do not take these medicines before your procedure if your health care provider instructs you not to.  You may be given antibiotic medicine to help prevent infection. General instructions  You may need to have blood tests, a test to check heart rhythm (electrocardiogram, or ECG), or a test to check blood flow (angiogram) before the surgery.  Imaging tests will be done to check the size and location of the aneurysm. These tests could include an ultrasound, a CT scan, or an MRI.  Do not use any products that contain nicotine or tobacco--such as cigarettes and e-cigarettes--for as   long as possible before the surgery. If you need help quitting, ask your health care provider.  Ask your health care provider how your surgical site will be marked or identified.  Plan to have someone take you home from the hospital or clinic. What happens during the procedure?  To reduce your risk of infection: ? Your health care team will wash or  sanitize their hands. ? Your skin will be washed with soap. ? Hair may be removed from the surgical area.  An IV tube will be inserted into one of your veins.  You will be given one or more of the following: ? A medicine to help you relax (sedative). ? A medicine to numb the area (local anesthetic). ? A medicine to make you fall asleep (general anesthetic). ? A medicine that is injected into an area of your body to numb everything below the injection site (regional anesthetic).  During the surgery: ? Small incisions or a puncture will be made on one or both sides of the groin. Long, thin tubes (catheters) will be passed through the opening, put into the artery in your thigh, and moved up into the aneurysm in the aorta. ? The health care provider will use live X-ray pictures to guide the endograft through the catheterto the place where the aneurysm is. ? The endograft will be released to seal off the aneurysm and to line the aorta. It will keep blood from flowing into the aneurysm and will help keep it from rupturing. The endograft will stay in place and will not be taken out. ? X-rays will be used to check where the endograft is placed and to make sure that it is where it should be. ? The catheter will be taken out, and the incision will be closed with stitches (sutures). The procedure may vary among health care providers and hospitals. What happens after the procedure?  Your blood pressure, heart rate, breathing rate, and blood oxygen level will be monitored until the medicines you were given have worn off.  You will need to lie flat for a number of hours. Bending your legs can cause them to bleed and swell.  You will then be urged to get up and move around a number of times each day and to slowly become more active.  You will be given medicines to control pain.  Certain tests may be done after your procedure to check how well the endograft is working and to check its placement.  Do  not drive for 24 hours if you received a sedative. This information is not intended to replace advice given to you by your health care provider. Make sure you discuss any questions you have with your health care provider. Document Revised: 10/13/2017 Document Reviewed: 01/25/2016 Elsevier Patient Education  2020 [employer]  

## 2019-12-10 NOTE — Progress Notes (Signed)
MRN : [id number]  Nathaniel Gamble is a 84 y.o. (01/18/1936) male who presents with chief complaint of  Chief Complaint  Patient presents with  . Follow-up    ultrasound follow up  .  History of Present Illness: Patient returns today in follow-up of multiple vascular issues.  He is scheduled for his yearly carotid duplex today which shows stable, mild, 1 to 39% bilateral carotid artery stenosis without significant progression from his previous study.  He was hospitalized with strokes and other issues back in the fall and has made a significant recovery.  His feeding tube was taken out a few weeks ago and the wound is healed. The more pressing issue currently is his aneurysm.  He had a CT scan since his last visit which I have independently reviewed.  This demonstrates an irregular 5.5 cm infrarenal abdominal aortic aneurysm.  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  This is worse on the left than the right.  He also has what appears to be significant renal artery stenosis.  He has had a previous renal stent placed which appears patent but has a second left renal artery which is stenotic.  Current Outpatient Medications  Medication Sig Dispense Refill  . acetaminophen (TYLENOL) 325 MG tablet Place 650 mg into feeding tube every 6 (six) hours as needed.    [name] amLODipine (NORVASC) 10 MG tablet Place 10 mg into feeding tube daily.     [name] apixaban (ELIQUIS) 5 MG TABS tablet Place 5 mg into feeding tube 2 (two) times daily.    . carvedilol (COREG) 25 MG tablet Place 25 mg into feeding tube 2 (two) times daily with a meal.     . hydrALAZINE (APRESOLINE) 10 MG tablet Take 10 mg by mouth Nightly.    [name] losartan (COZAAR) 100 MG tablet Take 100 mg by mouth daily.     . multivitamin-lutein (OCUVITE-LUTEIN) CAPS capsule Take 1 capsule by mouth daily.    . simvastatin (ZOCOR) 40 MG tablet Take 40 mg by mouth at bedtime.    . Vitamin D, Ergocalciferol, (DRISDOL) 50000 units CAPS  capsule Take 50,000 Units by mouth once a week.   1  . Baclofen 5 MG TABS Take by mouth as needed.    . Nutritional Supplements (ISOSOURCE PO) Take by mouth 5 (five) times daily.    [name] scopolamine (TRANSDERM-SCOP) 1 MG/3DAYS Place 1 patch onto the skin every 3 (three) days as needed.    . senna (SENOKOT) 8.6 MG tablet Take 1 tablet by mouth 2 (two) times daily as needed for constipation.     No current facility-administered medications for this visit.    Past Medical History:  Diagnosis Date  . Abdominal aortic aneurysm ([place])    monitored by dr. Ubaldo Glassing  . Abdominal aortic aneurysm ([place])   . Cancer ([place]) 1976   melanoma, under right arm, removed  . Chronic kidney disease 2011   stent right kidney  . Coronary artery disease   . Diabetes mellitus without complication ([place])   . Hypertension   . Jaw fracture ([place])   . Myocardial infarction ([place])   . Peripheral vascular disease ([place])   . PONV (postoperative nausea and vomiting)   . Renal artery stenosis [facility])     Past Surgical History:  Procedure Laterality Date  . CATARACT EXTRACTION Bilateral 2014  . CORONARY ARTERY BYPASS GRAFT  2010  . INGUINAL HERNIA REPAIR Left 07/09/2015   Procedure: HERNIA REPAIR INGUINAL ADULT;  Surgeon: Leonie Green, MD;  Location: [facility] ORS;  Service: General;  Laterality: Left;  . LOOP RECORDER INSERTION N/A 06/28/2018   Procedure: LOOP RECORDER INSERTION;  Surgeon: Isaias Cowman, MD;  Location: [facility];  Service: Cardiovascular;  Laterality: N/A;  . MANDIBLE FRACTURE SURGERY Left   . RENAL ARTERY STENT Right      Social History   Tobacco Use  . Smoking status: Former Research scientist (life sciences)  . Smokeless tobacco: Former Network engineer Use Topics  . Alcohol use: Yes    Alcohol/week: 1.0 standard drinks    Types: 1 Glasses of wine per week  . Drug use: No    Family History  Problem Relation Age of Onset  . Hyperlipidemia Mother   . Stroke Mother   . Hyperlipidemia Father   No  bleeding or clotting disorders   Allergies  Allergen Reactions  . Oxycodone Nausea Only  . Lisinopril Swelling  . Tetanus Toxoids Swelling     REVIEW OF SYSTEMS (Negative unless checked)  Constitutional: [] ?Weight loss  [] ?Fever  [] ?Chills Cardiac: [] ?Chest pain   [] ?Chest pressure   [] ?Palpitations   [] ?Shortness of breath when laying flat   [] ?Shortness of breath at rest   [x] ?Shortness of breath with exertion. Vascular:  [] ?Pain in legs with walking   [] ?Pain in legs at rest   [] ?Pain in legs when laying flat   [] ?Claudication   [] ?Pain in feet when walking  [] ?Pain in feet at rest  [] ?Pain in feet when laying flat   [] ?History of DVT   [] ?Phlebitis   [] ?Swelling in legs   [] ?Varicose veins   [] ?Non-healing ulcers Pulmonary:   [] ?Uses home oxygen   [] ?Productive cough   [] ?Hemoptysis   [] ?Wheeze  [] ?COPD   [] ?Asthma Neurologic:  [] ?Dizziness  [] ?Blackouts   [] ?Seizures   [x] ?History of stroke   [] ?History of TIA  [] ?Aphasia   [] ?Temporary blindness   [x] ?Dysphagia   [] ?Weakness or numbness in arms   [] ?Weakness or numbness in legs Musculoskeletal:  [] ?Arthritis   [] ?Joint swelling   [] ?Joint pain   [] ?Low back pain Hematologic:  [] ?Easy bruising  [] ?Easy bleeding   [] ?Hypercoagulable state   [] ?Anemic  [] ?Hepatitis Gastrointestinal:  [] ?Blood in stool   [] ?Vomiting blood  [x] ?Gastroesophageal reflux/heartburn   [] ?Abdominal pain Genitourinary:  [] ?Chronic kidney disease   [] ?Difficult urination  [] ?Frequent urination  [] ?Burning with urination   [] ?Hematuria Skin:  [] ?Rashes   [] ?Ulcers   [] ?Wounds Psychological:  [] ?History of anxiety   [] ? History of major depression.  Physical Examination  Vitals:   12/10/19 0914  BP: (!) 145/66  Pulse: (!) 55  Resp: 16  Weight: 173 lb (78.5 kg)   Body mass index is 24.13 kg/m. Gen:  WD/WN, NAD.  Appears much more robust than he did a few months ago. Head: [place]/AT, No temporalis wasting. Ear/Nose/Throat: Hearing somewhat decreased, nares  w/o erythema or drainage, trachea midline Eyes: Conjunctiva clear. Sclera non-icteric Neck: Supple.  No bruit  Pulmonary:  Good air movement, equal and clear to auscultation bilaterally.  Cardiac: RRR, No JVD Vascular:  Vessel Right Left  Radial Palpable Palpable                          PT  1+ palpable  not palpable  DP  1+ palpable  1+ palpable   Gastrointestinal: soft, non-tender/non-distended.  Umbilical hernia is present.  G-tube site is well-healed.  Increased aortic impulse present Musculoskeletal: M/S 5/5 throughout.  No  deformity or atrophy.  Trace lower extremity edema. Neurologic: CN 2-12 intact. Sensation grossly intact in extremities.  Symmetrical.  Speech is fluent. Motor exam as listed above. Psychiatric: Judgment intact, Mood & affect appropriate for pt's clinical situation. Dermatologic: No rashes or ulcers noted.  No cellulitis or open wounds. Lymph : No Cervical, Axillary, or Inguinal lymphadenopathy.     CBC Lab Results  Component Value Date   WBC 7.1 07/20/2019   HGB 12.4 (L) 07/20/2019   HCT 38.4 (L) 07/20/2019   MCV 93.4 07/20/2019   PLT 126 (L) 07/20/2019    BMET    Component Value Date/Time   NA 140 07/20/2019 0931   K 4.9 07/20/2019 0931   CL 101 07/20/2019 0931   CO2 27 07/20/2019 0931   GLUCOSE 109 (H) 07/20/2019 0931   BUN 48 (H) 07/20/2019 0931   CREATININE 1.46 (H) 07/20/2019 0931   CALCIUM 9.9 07/20/2019 0931   GFRNONAA 45 (L) 07/20/2019 0931   GFRAA 52 (L) 07/20/2019 0931   CrCl cannot be calculated (Patient's most recent lab result is older than the maximum 21 days allowed.).  COAG Lab Results  Component Value Date   INR 1.4 (H) 07/20/2019   INR 1.05 05/10/2018    Radiology No results found.   Assessment/Plan Hypertension blood pressure control important in reducing the progression of atherosclerotic disease and AAA growth. On appropriate oral medications.   Dyslipidemia lipid control important in reducing the  progression of atherosclerotic disease. Continue statin therapy  Diabetes mellitus without complication (HCC) blood glucose control important in reducing the progression of atherosclerotic disease. Also, involved in wound healing. On appropriate medications.   Carotid stenosis Carotid stenosis is stable in the 1 to 39% range.  No role for intervention at that level.  Check annually.  Abdominal aortic aneurysm [facility]) The patient has had persistent growth of his abdominal aortic aneurysm and it now measures 5.5 cm on CT scan performed I have independently reviewed.  It is highly irregular and associated with iliac artery occlusive disease.  The iliac artery occlusive disease can likely be treated concomitantly.  His anatomy is suitable for endovascular repair and at this point with the size of the aneurysm it should be repaired.  The patient and his family are agreeable to proceed with endovascular abdominal aortic aneurysm repair and this will be scheduled in the near future at his convenience.    Leotis Pain, MD  12/10/2019 10:19 AM    This note was created with Dragon medical transcription system.  Any errors from dictation are purely unintentional

## 2019-12-10 NOTE — Assessment & Plan Note (Signed)
The patient has had persistent growth of his abdominal aortic aneurysm and it now measures 5.5 cm on CT scan performed I have independently reviewed.  It is highly irregular and associated with iliac artery occlusive disease.  The iliac artery occlusive disease can likely be treated concomitantly.  His anatomy is suitable for endovascular repair and at this point with the size of the aneurysm it should be repaired.  The patient and his family are agreeable to proceed with endovascular abdominal aortic aneurysm repair and this will be scheduled in the near future at his convenience.

## 2019-12-10 NOTE — Assessment & Plan Note (Signed)
Carotid stenosis is stable in the 1 to 39% range.  No role for intervention at that level.  Check annually.

## 2019-12-12 ENCOUNTER — Telehealth (INDEPENDENT_AMBULATORY_CARE_PROVIDER_SITE_OTHER): Payer: Self-pay

## 2019-12-12 NOTE — Telephone Encounter (Signed)
Spoke with the patient and he is now scheduled for surgery with Dr. Lucky Cowboy on 12/25/19 for a Endovascular AAA repair. Patient will do pre-op phone call on 12/18/19 between 1-5 and covid testing on 12/23/19 between 12:30-2:30 pm. Pre-surgical instructions were discussed and will be mailed to the patient.

## 2019-12-16 ENCOUNTER — Other Ambulatory Visit (INDEPENDENT_AMBULATORY_CARE_PROVIDER_SITE_OTHER): Payer: Self-pay | Admitting: Nurse Practitioner

## 2019-12-18 ENCOUNTER — Encounter
Admission: RE | Admit: 2019-12-18 | Discharge: 2019-12-18 | Disposition: A | Discharge status: Home / Self Care | Payer: BC Managed Care – PPO | Source: Ambulatory Visit | Attending: Vascular Surgery | Admitting: Vascular Surgery

## 2019-12-18 ENCOUNTER — Other Ambulatory Visit: Payer: Self-pay

## 2019-12-18 DIAGNOSIS — Z01812 Encounter for preprocedural laboratory examination: Secondary | ICD-10-CM | POA: Diagnosis present

## 2019-12-18 HISTORY — DX: Cerebral infarction, unspecified: I63.9

## 2019-12-18 NOTE — Patient Instructions (Addendum)
Your procedure is scheduled on: Wednesday 10/23/20.  Report to DAY SURGERY DEPARTMENT LOCATED ON 2ND FLOOR MEDICAL MALL ENTRANCE. To find out your arrival time please call [phone] between 1PM - 3PM on Tuesday 10/22/20.   Remember: Instructions that are not followed completely may result in serious medical risk, up to and including death, or upon the discretion of your surgeon and anesthesiologist your surgery may need to be rescheduled.      _X__ 1. Do not eat food after midnight the night before your procedure.                 No gum chewing or hard candies. You may drink clear liquids up to 2 hours                 before you are scheduled to arrive for your surgery- DO NOT drink clear                 liquids within 2 hours of the start of your surgery.                 Clear Liquids include:  water, apple juice without pulp, clear carbohydrate                 drink such as Clearfast or Gatorade, Black Coffee or Tea (Do not add                 anything to coffee or tea).    __X__2.  On the morning of surgery brush your teeth with toothpaste and water, you may rinse your mouth with mouthwash if you wish.  Do not swallow any toothpaste or mouthwash.       _X__ 3.  No Alcohol for 24 hours before or after surgery.     _X__ 4.  Do Not Smoke or use e-cigarettes For 24 Hours Prior to Your Surgery.                 Do not use any chewable tobacco products for at least 6 hours prior to                 surgery.   __X__5.  Notify your doctor if there is any change in your medical condition      (cold, fever, infections).       Do not wear jewelry, make-up, hairpins, clips or nail polish. Do not wear lotions, powders, or perfumes.  Do not shave 48 hours prior to surgery. Men may shave face and neck. Do not bring valuables to the hospital.     [facility] is not responsible for any belongings or valuables.   Contacts, dentures/partials or body piercings may not be worn into  surgery. Bring a case for your contacts, glasses or hearing aids, a denture cup will be supplied.   Patients discharged the day of surgery will not be allowed to drive home.     __X__ Take these medicines the morning of surgery with A SIP OF WATER:     1. amLODipine (NORVASC) 10 MG tablet  2. carvedilol (COREG) 25 MG tablet    __X__ Use CHG Soap as directed   __X__ Stop Blood Thinners: Eliquis 3 days prior to your procedure. Your last dose will be on Saturday evening. 10/19/20.   __X__ Stop Anti-inflammatories 7 days before surgery such as Advil, Ibuprofen, Motrin, BC or Goodies Powder, Naprosyn, Naproxen, Aleve, Aspirin, Meloxicam. May take Tylenol if needed for pain or discomfort.  __X__ Don't begin taking any new herbal supplements.

## 2019-12-20 ENCOUNTER — Other Ambulatory Visit (INDEPENDENT_AMBULATORY_CARE_PROVIDER_SITE_OTHER): Payer: Self-pay | Admitting: Nurse Practitioner

## 2019-12-23 ENCOUNTER — Other Ambulatory Visit
Admission: RE | Admit: 2019-12-23 | Discharge: 2019-12-23 | Disposition: A | Discharge status: Home / Self Care | Payer: BC Managed Care – PPO | Source: Ambulatory Visit | Attending: Vascular Surgery | Admitting: Vascular Surgery

## 2019-12-23 ENCOUNTER — Other Ambulatory Visit: Payer: Self-pay

## 2019-12-23 DIAGNOSIS — Z01812 Encounter for preprocedural laboratory examination: Secondary | ICD-10-CM | POA: Diagnosis present

## 2019-12-23 DIAGNOSIS — Z20822 Contact with and (suspected) exposure to covid-19: Secondary | ICD-10-CM | POA: Insufficient documentation

## 2019-12-23 LAB — CBC WITH DIFFERENTIAL/PLATELET
Abs Immature Granulocytes: 0.01 10*3/uL (ref 0.00–0.07)
Basophils Absolute: 0 10*3/uL (ref 0.0–0.1)
Basophils Relative: 1 %
Eosinophils Absolute: 0.1 10*3/uL (ref 0.0–0.5)
Eosinophils Relative: 1 %
HCT: 37.5 % — ABNORMAL LOW (ref 39.0–52.0)
Hemoglobin: 12.8 g/dL — ABNORMAL LOW (ref 13.0–17.0)
Immature Granulocytes: 0 %
Lymphocytes Relative: 39 %
Lymphs Abs: 2.2 10*3/uL (ref 0.7–4.0)
MCH: 30.6 pg (ref 26.0–34.0)
MCHC: 34.1 g/dL (ref 30.0–36.0)
MCV: 89.7 fL (ref 80.0–100.0)
Monocytes Absolute: 0.6 10*3/uL (ref 0.1–1.0)
Monocytes Relative: 10 %
Neutro Abs: 2.8 10*3/uL (ref 1.7–7.7)
Neutrophils Relative %: 49 %
Platelets: 173 10*3/uL (ref 150–400)
RBC: 4.18 MIL/uL — ABNORMAL LOW (ref 4.22–5.81)
RDW: 14.7 % (ref 11.5–15.5)
WBC: 5.7 10*3/uL (ref 4.0–10.5)
nRBC: 0 % (ref 0.0–0.2)

## 2019-12-23 LAB — BASIC METABOLIC PANEL
Anion gap: 8 (ref 5–15)
BUN: 39 mg/dL — ABNORMAL HIGH (ref 8–23)
CO2: 23 mmol/L (ref 22–32)
Calcium: 8.9 mg/dL (ref 8.9–10.3)
Chloride: 106 mmol/L (ref 98–111)
Creatinine, Ser: 1.54 mg/dL — ABNORMAL HIGH (ref 0.61–1.24)
GFR calc Af Amer: 48 mL/min — ABNORMAL LOW (ref >60)
GFR calc non Af Amer: 42 mL/min — ABNORMAL LOW (ref >60)
Glucose, Bld: 156 mg/dL — ABNORMAL HIGH (ref 70–99)
Potassium: 4.3 mmol/L (ref 3.5–5.1)
Sodium: 137 mmol/L (ref 135–145)

## 2019-12-23 LAB — APTT: aPTT: 35 seconds (ref 24–36)

## 2019-12-23 LAB — PROTIME-INR
INR: 1.4 — ABNORMAL HIGH (ref 0.8–1.2)
Prothrombin Time: 17.3 seconds — ABNORMAL HIGH (ref 11.4–15.2)

## 2019-12-23 NOTE — Pre-Procedure Instructions (Signed)
Pre-Admit Testing Provider Notification Note  Provider Notified: Dr. Marry Guan   Notification Mode: Fax  Reason: Abnormal lab results.  Response: Fax confirmation received.  Additional Information: Placed on chart. Noted on PAT worksheet.  Signed: Beulah Gandy, RN

## 2019-12-24 LAB — TYPE AND SCREEN
ABO/RH(D): A POS
Antibody Screen: NEGATIVE

## 2019-12-24 LAB — SARS CORONAVIRUS 2 (TAT 6-24 HRS): SARS Coronavirus 2: NEGATIVE

## 2019-12-24 MED ORDER — CEFAZOLIN SODIUM-DEXTROSE 2-4 GM/100ML-% IV SOLN
2.0000 g | INTRAVENOUS | Status: AC
  Administered 2019-12-25: 2 g via INTRAVENOUS

## 2019-12-25 ENCOUNTER — Other Ambulatory Visit: Payer: Self-pay

## 2019-12-25 ENCOUNTER — Encounter
Admission: RE | Disposition: A | Discharge status: Home / Self Care | Payer: Self-pay | Source: Home / Self Care | Attending: Vascular Surgery

## 2019-12-25 ENCOUNTER — Ambulatory Visit: Payer: BC Managed Care – PPO | Admitting: Certified Registered"

## 2019-12-25 ENCOUNTER — Encounter: Payer: Self-pay | Admitting: Vascular Surgery

## 2019-12-25 ENCOUNTER — Inpatient Hospital Stay
Admission: RE | Admit: 2019-12-25 | Discharge: 2019-12-26 | DRG: 269 | Disposition: A | Discharge status: Home / Self Care | Payer: BC Managed Care – PPO | Attending: Vascular Surgery | Admitting: Vascular Surgery

## 2019-12-25 DIAGNOSIS — Z887 Allergy status to serum and vaccine status: Secondary | ICD-10-CM | POA: Diagnosis not present

## 2019-12-25 DIAGNOSIS — Z951 Presence of aortocoronary bypass graft: Secondary | ICD-10-CM

## 2019-12-25 DIAGNOSIS — Z885 Allergy status to narcotic agent status: Secondary | ICD-10-CM

## 2019-12-25 DIAGNOSIS — Z888 Allergy status to other drugs, medicaments and biological substances status: Secondary | ICD-10-CM | POA: Diagnosis not present

## 2019-12-25 DIAGNOSIS — I70213 Atherosclerosis of native arteries of extremities with intermittent claudication, bilateral legs: Secondary | ICD-10-CM | POA: Diagnosis present

## 2019-12-25 DIAGNOSIS — Z85820 Personal history of malignant melanoma of skin: Secondary | ICD-10-CM

## 2019-12-25 DIAGNOSIS — I10 Essential (primary) hypertension: Secondary | ICD-10-CM | POA: Diagnosis present

## 2019-12-25 DIAGNOSIS — Z823 Family history of stroke: Secondary | ICD-10-CM

## 2019-12-25 DIAGNOSIS — I252 Old myocardial infarction: Secondary | ICD-10-CM

## 2019-12-25 DIAGNOSIS — Z8349 Family history of other endocrine, nutritional and metabolic diseases: Secondary | ICD-10-CM

## 2019-12-25 DIAGNOSIS — I714 Abdominal aortic aneurysm, without rupture, unspecified: Secondary | ICD-10-CM | POA: Diagnosis present

## 2019-12-25 DIAGNOSIS — I70212 Atherosclerosis of native arteries of extremities with intermittent claudication, left leg: Secondary | ICD-10-CM

## 2019-12-25 DIAGNOSIS — I2510 Atherosclerotic heart disease of native coronary artery without angina pectoris: Secondary | ICD-10-CM | POA: Diagnosis present

## 2019-12-25 DIAGNOSIS — Z87891 Personal history of nicotine dependence: Secondary | ICD-10-CM

## 2019-12-25 DIAGNOSIS — I701 Atherosclerosis of renal artery: Secondary | ICD-10-CM | POA: Diagnosis present

## 2019-12-25 DIAGNOSIS — E1151 Type 2 diabetes mellitus with diabetic peripheral angiopathy without gangrene: Secondary | ICD-10-CM | POA: Diagnosis present

## 2019-12-25 DIAGNOSIS — Z95828 Presence of other vascular implants and grafts: Secondary | ICD-10-CM

## 2019-12-25 DIAGNOSIS — Z7901 Long term (current) use of anticoagulants: Secondary | ICD-10-CM

## 2019-12-25 HISTORY — PX: ABDOMINAL AORTIC ENDOVASCULAR STENT GRAFT: SHX5707

## 2019-12-25 HISTORY — PX: ENDOVASCULAR REPAIR/STENT GRAFT: CATH118280

## 2019-12-25 LAB — ABO/RH: ABO/RH(D): A POS

## 2019-12-25 LAB — GLUCOSE, CAPILLARY: Glucose-Capillary: 118 mg/dL — ABNORMAL HIGH (ref 70–99)

## 2019-12-25 LAB — MRSA PCR SCREENING: MRSA by PCR: NEGATIVE

## 2019-12-25 SURGERY — ENDOVASCULAR REPAIR/STENT GRAFT
Anesthesia: General

## 2019-12-25 SURGERY — INSERTION, ENDOVASCULAR STENT GRAFT, AORTA, ABDOMINAL
Anesthesia: General

## 2019-12-25 MED ORDER — EPHEDRINE SULFATE 50 MG/ML IJ SOLN
INTRAMUSCULAR | Status: AC
  Filled 2019-12-25: qty 1

## 2019-12-25 MED ORDER — LACTATED RINGERS IV SOLN: INTRAVENOUS | Status: DC | PRN

## 2019-12-25 MED ORDER — CEFAZOLIN SODIUM-DEXTROSE 2-4 GM/100ML-% IV SOLN
INTRAVENOUS | Status: AC
  Administered 2019-12-26: 2 g via INTRAVENOUS
  Filled 2019-12-25: qty 100

## 2019-12-25 MED ORDER — FENTANYL CITRATE (PF) 100 MCG/2ML IJ SOLN
INTRAMUSCULAR | Status: AC
  Filled 2019-12-25: qty 2

## 2019-12-25 MED ORDER — OXYCODONE-ACETAMINOPHEN 5-325 MG PO TABS: 1.0000 | ORAL_TABLET | ORAL | Status: DC | PRN

## 2019-12-25 MED ORDER — CHLORHEXIDINE GLUCONATE CLOTH 2 % EX PADS
6.0000 | MEDICATED_PAD | Freq: Once | CUTANEOUS | Status: AC
  Administered 2019-12-25: 6 via TOPICAL

## 2019-12-25 MED ORDER — ROCURONIUM BROMIDE 50 MG/5ML IV SOLN
INTRAVENOUS | Status: AC
  Filled 2019-12-25: qty 1

## 2019-12-25 MED ORDER — METOPROLOL TARTRATE 5 MG/5ML IV SOLN: 2.0000 mg | INTRAVENOUS | Status: DC | PRN

## 2019-12-25 MED ORDER — FAMOTIDINE 20 MG PO TABS: 20.0000 mg | ORAL_TABLET | Freq: Once | ORAL | Status: AC

## 2019-12-25 MED ORDER — MORPHINE SULFATE (PF) 2 MG/ML IV SOLN: 2.0000 mg | INTRAVENOUS | Status: DC | PRN

## 2019-12-25 MED ORDER — HYDROMORPHONE HCL 1 MG/ML IJ SOLN: 1.0000 mg | Freq: Once | INTRAMUSCULAR | Status: DC | PRN

## 2019-12-25 MED ORDER — ROCURONIUM BROMIDE 100 MG/10ML IV SOLN
INTRAVENOUS | Status: DC | PRN
  Administered 2019-12-25: 30 mg via INTRAVENOUS
  Administered 2019-12-25: 50 mg via INTRAVENOUS

## 2019-12-25 MED ORDER — APIXABAN 5 MG PO TABS
5.0000 mg | ORAL_TABLET | Freq: Two times a day (BID) | ORAL | Status: DC
  Administered 2019-12-26: 5 mg via ORAL
  Filled 2019-12-25: qty 1

## 2019-12-25 MED ORDER — ONDANSETRON HCL 4 MG/2ML IJ SOLN
INTRAMUSCULAR | Status: DC | PRN
  Administered 2019-12-25: 4 mg via INTRAVENOUS

## 2019-12-25 MED ORDER — ACETAMINOPHEN 325 MG PO TABS
325.0000 mg | ORAL_TABLET | ORAL | Status: DC | PRN
  Administered 2019-12-25 (×2): 650 mg via ORAL
  Filled 2019-12-25 (×2): qty 2

## 2019-12-25 MED ORDER — HEPARIN SODIUM (PORCINE) 1000 UNIT/ML IJ SOLN
INTRAMUSCULAR | Status: DC | PRN
  Administered 2019-12-25: 4000 [IU] via INTRAVENOUS

## 2019-12-25 MED ORDER — OXYCODONE-ACETAMINOPHEN 5-325 MG PO TABS: 1.0000 | ORAL_TABLET | Freq: Four times a day (QID) | ORAL | Status: DC | PRN

## 2019-12-25 MED ORDER — CHLORHEXIDINE GLUCONATE CLOTH 2 % EX PADS: 6.0000 | MEDICATED_PAD | Freq: Once | CUTANEOUS | Status: DC

## 2019-12-25 MED ORDER — LABETALOL HCL 5 MG/ML IV SOLN
10.0000 mg | INTRAVENOUS | Status: DC | PRN
  Administered 2019-12-25: 10 mg via INTRAVENOUS
  Filled 2019-12-25: qty 4

## 2019-12-25 MED ORDER — ALUM & MAG HYDROXIDE-SIMETH 200-200-20 MG/5ML PO SUSP: 15.0000 mL | ORAL | Status: DC | PRN

## 2019-12-25 MED ORDER — SODIUM CHLORIDE 0.9 % IV SOLN: INTRAVENOUS | Status: AC

## 2019-12-25 MED ORDER — LIDOCAINE HCL (CARDIAC) PF 100 MG/5ML IV SOSY
PREFILLED_SYRINGE | INTRAVENOUS | Status: DC | PRN
  Administered 2019-12-25: 60 mg via INTRAVENOUS

## 2019-12-25 MED ORDER — POTASSIUM CHLORIDE CRYS ER 20 MEQ PO TBCR: 20.0000 meq | EXTENDED_RELEASE_TABLET | Freq: Every day | ORAL | Status: DC | PRN

## 2019-12-25 MED ORDER — MAGNESIUM SULFATE 2 GM/50ML IV SOLN: 2.0000 g | Freq: Every day | INTRAVENOUS | Status: DC | PRN

## 2019-12-25 MED ORDER — ONDANSETRON HCL 4 MG/2ML IJ SOLN: 4.0000 mg | Freq: Once | INTRAMUSCULAR | Status: DC | PRN

## 2019-12-25 MED ORDER — PHENOL 1.4 % MT LIQD
1.0000 | OROMUCOSAL | Status: DC | PRN
  Filled 2019-12-25: qty 177

## 2019-12-25 MED ORDER — SUGAMMADEX SODIUM 500 MG/5ML IV SOLN
INTRAVENOUS | Status: AC
  Filled 2019-12-25: qty 5

## 2019-12-25 MED ORDER — FENTANYL CITRATE (PF) 100 MCG/2ML IJ SOLN
INTRAMUSCULAR | Status: DC | PRN
  Administered 2019-12-25: 50 ug via INTRAVENOUS
  Administered 2019-12-25 (×2): 25 ug via INTRAVENOUS

## 2019-12-25 MED ORDER — ASPIRIN EC 81 MG PO TBEC
81.0000 mg | DELAYED_RELEASE_TABLET | Freq: Every day | ORAL | Status: DC
  Administered 2019-12-26: 81 mg via ORAL
  Filled 2019-12-25: qty 1

## 2019-12-25 MED ORDER — IODIXANOL 320 MG/ML IV SOLN
INTRAVENOUS | Status: DC | PRN
  Administered 2019-12-25: 09:00:00 145 mg

## 2019-12-25 MED ORDER — DOCUSATE SODIUM 100 MG PO CAPS
100.0000 mg | ORAL_CAPSULE | Freq: Every day | ORAL | Status: DC
  Administered 2019-12-26: 100 mg via ORAL
  Filled 2019-12-25: qty 1

## 2019-12-25 MED ORDER — ONDANSETRON HCL 4 MG/2ML IJ SOLN: 4.0000 mg | Freq: Four times a day (QID) | INTRAMUSCULAR | Status: DC | PRN

## 2019-12-25 MED ORDER — EPHEDRINE SULFATE 50 MG/ML IJ SOLN
INTRAMUSCULAR | Status: DC | PRN
  Administered 2019-12-25 (×4): 5 mg via INTRAVENOUS

## 2019-12-25 MED ORDER — AMLODIPINE BESYLATE 10 MG PO TABS
10.0000 mg | ORAL_TABLET | Freq: Every day | ORAL | Status: DC
  Administered 2019-12-26: 10 mg via ORAL
  Filled 2019-12-25: qty 1

## 2019-12-25 MED ORDER — GUAIFENESIN-DM 100-10 MG/5ML PO SYRP: 15.0000 mL | ORAL_SOLUTION | ORAL | Status: DC | PRN

## 2019-12-25 MED ORDER — CEFAZOLIN SODIUM-DEXTROSE 2-4 GM/100ML-% IV SOLN
2.0000 g | Freq: Three times a day (TID) | INTRAVENOUS | Status: AC
  Administered 2019-12-25: 2 g via INTRAVENOUS
  Filled 2019-12-25 (×2): qty 100

## 2019-12-25 MED ORDER — ACETAMINOPHEN 650 MG RE SUPP: 325.0000 mg | RECTAL | Status: DC | PRN

## 2019-12-25 MED ORDER — DEXAMETHASONE SODIUM PHOSPHATE 10 MG/ML IJ SOLN
INTRAMUSCULAR | Status: DC | PRN
  Administered 2019-12-25: 5 mg via INTRAVENOUS

## 2019-12-25 MED ORDER — TRAMADOL HCL 50 MG PO TABS
50.0000 mg | ORAL_TABLET | Freq: Four times a day (QID) | ORAL | Status: DC | PRN
  Administered 2019-12-26: 50 mg via ORAL
  Filled 2019-12-25: qty 1

## 2019-12-25 MED ORDER — SUGAMMADEX SODIUM 500 MG/5ML IV SOLN
INTRAVENOUS | Status: DC | PRN
  Administered 2019-12-25: 300 mg via INTRAVENOUS

## 2019-12-25 MED ORDER — ONDANSETRON HCL 4 MG/2ML IJ SOLN
INTRAMUSCULAR | Status: AC
  Filled 2019-12-25: qty 2

## 2019-12-25 MED ORDER — FAMOTIDINE IN NACL 20-0.9 MG/50ML-% IV SOLN
20.0000 mg | Freq: Two times a day (BID) | INTRAVENOUS | Status: DC
  Administered 2019-12-25 – 2019-12-26 (×3): 20 mg via INTRAVENOUS
  Filled 2019-12-25 (×3): qty 50

## 2019-12-25 MED ORDER — EPINEPHRINE PF 1 MG/ML IJ SOLN
INTRAMUSCULAR | Status: AC
  Filled 2019-12-25: qty 1

## 2019-12-25 MED ORDER — CARVEDILOL 25 MG PO TABS
25.0000 mg | ORAL_TABLET | Freq: Two times a day (BID) | ORAL | Status: DC
  Administered 2019-12-25 – 2019-12-26 (×2): 25 mg via ORAL
  Filled 2019-12-25 (×2): qty 1

## 2019-12-25 MED ORDER — BUPIVACAINE HCL (PF) 0.25 % IJ SOLN
INTRAMUSCULAR | Status: AC
  Filled 2019-12-25: qty 30

## 2019-12-25 MED ORDER — DEXAMETHASONE SODIUM PHOSPHATE 10 MG/ML IJ SOLN
INTRAMUSCULAR | Status: AC
  Filled 2019-12-25: qty 1

## 2019-12-25 MED ORDER — FAMOTIDINE 20 MG PO TABS
ORAL_TABLET | ORAL | Status: AC
  Administered 2019-12-25: 20 mg via ORAL
  Filled 2019-12-25: qty 1

## 2019-12-25 MED ORDER — OCUVITE-LUTEIN PO CAPS
1.0000 | ORAL_CAPSULE | Freq: Every day | ORAL | Status: DC
  Administered 2019-12-25 – 2019-12-26 (×2): 1 via ORAL
  Filled 2019-12-25 (×3): qty 1

## 2019-12-25 MED ORDER — SODIUM CHLORIDE 0.9 % IV SOLN: 500.0000 mL | Freq: Once | INTRAVENOUS | Status: DC | PRN

## 2019-12-25 MED ORDER — LOSARTAN POTASSIUM 50 MG PO TABS
100.0000 mg | ORAL_TABLET | Freq: Every day | ORAL | Status: DC
  Administered 2019-12-25 – 2019-12-26 (×2): 100 mg via ORAL
  Filled 2019-12-25 (×2): qty 2

## 2019-12-25 MED ORDER — HYDRALAZINE HCL 10 MG PO TABS
10.0000 mg | ORAL_TABLET | Freq: Every evening | ORAL | Status: DC
  Administered 2019-12-26: 10 mg via ORAL
  Filled 2019-12-25 (×2): qty 1

## 2019-12-25 MED ORDER — PROPOFOL 10 MG/ML IV BOLUS
INTRAVENOUS | Status: AC
  Filled 2019-12-25: qty 40

## 2019-12-25 MED ORDER — HYDRALAZINE HCL 20 MG/ML IJ SOLN
5.0000 mg | INTRAMUSCULAR | Status: DC | PRN
  Administered 2019-12-25: 5 mg via INTRAVENOUS
  Filled 2019-12-25: qty 1

## 2019-12-25 MED ORDER — ACETAMINOPHEN 325 MG PO TABS: 650.0000 mg | ORAL_TABLET | Freq: Four times a day (QID) | ORAL | Status: DC | PRN

## 2019-12-25 MED ORDER — AMLODIPINE BESYLATE 10 MG PO TABS: 10.0000 mg | ORAL_TABLET | Freq: Every day | ORAL | Status: DC

## 2019-12-25 MED ORDER — NITROGLYCERIN IN D5W 200-5 MCG/ML-% IV SOLN
5.0000 ug/min | INTRAVENOUS | Status: DC
  Administered 2019-12-25: 5 ug/min via INTRAVENOUS
  Filled 2019-12-25: qty 250

## 2019-12-25 MED ORDER — FENTANYL CITRATE (PF) 100 MCG/2ML IJ SOLN: 25.0000 ug | INTRAMUSCULAR | Status: DC | PRN

## 2019-12-25 MED ORDER — LACTATED RINGERS IV SOLN: INTRAVENOUS | Status: DC

## 2019-12-25 MED ORDER — PHENYLEPHRINE HCL (PRESSORS) 10 MG/ML IV SOLN
INTRAVENOUS | Status: DC | PRN
  Administered 2019-12-25 (×2): 50 ug via INTRAVENOUS

## 2019-12-25 MED ORDER — SIMVASTATIN 20 MG PO TABS
40.0000 mg | ORAL_TABLET | Freq: Every day | ORAL | Status: DC
  Administered 2019-12-25: 40 mg via ORAL
  Filled 2019-12-25: qty 2

## 2019-12-25 MED ORDER — SUCCINYLCHOLINE CHLORIDE 20 MG/ML IJ SOLN
INTRAMUSCULAR | Status: AC
  Filled 2019-12-25: qty 1

## 2019-12-25 MED ORDER — PROPOFOL 10 MG/ML IV BOLUS
INTRAVENOUS | Status: DC | PRN
  Administered 2019-12-25: 120 mg via INTRAVENOUS

## 2019-12-25 MED ORDER — PHENYLEPHRINE HCL (PRESSORS) 10 MG/ML IV SOLN
INTRAVENOUS | Status: AC
  Filled 2019-12-25: qty 1

## 2019-12-25 MED ORDER — NITROGLYCERIN IN D5W 200-5 MCG/ML-% IV SOLN
INTRAVENOUS | Status: AC
  Filled 2019-12-25: qty 250

## 2019-12-25 MED ORDER — SENNA 8.6 MG PO TABS: 1.0000 | ORAL_TABLET | Freq: Two times a day (BID) | ORAL | Status: DC | PRN

## 2019-12-25 MED ORDER — LIDOCAINE HCL (PF) 2 % IJ SOLN
INTRAMUSCULAR | Status: AC
  Filled 2019-12-25: qty 10

## 2019-12-25 SURGICAL SUPPLY — 34 items
BALLN DORADO 8X60X80 (BALLOONS) ×3
BALLOON DORADO 8X60X80 (BALLOONS) ×1 IMPLANT
CANNULA 5F STIFF (CANNULA) ×6 IMPLANT
CATH ACCU-VU SIZ PIG 5F 70CM (CATHETERS) ×3 IMPLANT
CATH BALLN CODA 9X100X32 (BALLOONS) ×3 IMPLANT
CATH BEACON 5 .035 40 KMP TP (CATHETERS) ×1 IMPLANT
CATH BEACON 5 .038 40 KMP TP (CATHETERS) ×2
COVER PROBE U/S 5X48 (MISCELLANEOUS) ×3 IMPLANT
DEVICE CLOSURE PERCLS PRGLD 6F (VASCULAR PRODUCTS) ×5 IMPLANT
DEVICE PRESTO INFLATION (MISCELLANEOUS) ×3 IMPLANT
DEVICE SAFEGUARD 24CM (GAUZE/BANDAGES/DRESSINGS) ×6 IMPLANT
DEVICE TORQUE .025-.038 (MISCELLANEOUS) ×6 IMPLANT
DRYSEAL FLEXSHEATH 12FR 33CM (SHEATH) ×2
DRYSEAL FLEXSHEATH 16FR 33CM (SHEATH) ×2
EXCLUDER TNK LEG 26MX12X18 (Endovascular Graft) ×1 IMPLANT
EXCLUDER TRUNK LEG 26MX12X18 (Endovascular Graft) ×3 IMPLANT
GAUZE SPONGE 4X4 12PLY STRL (GAUZE/BANDAGES/DRESSINGS) ×6 IMPLANT
GLIDEWIRE STIFF .35X180X3 HYDR (WIRE) ×6 IMPLANT
LEG CONTRALATERAL 16X12X10 (Vascular Products) ×2 IMPLANT
LEG CONTRALETERAL16X16X11.5 (Endovascular Graft) ×2 IMPLANT
NEEDLE ENTRY 21GA 7CM ECHOTIP (NEEDLE) ×3 IMPLANT
PACK ANGIOGRAPHY (CUSTOM PROCEDURE TRAY) ×3 IMPLANT
PERCLOSE PROGLIDE 6F (VASCULAR PRODUCTS) ×15
SET INTRO CAPELLA COAXIAL (SET/KITS/TRAYS/PACK) ×3 IMPLANT
SHEATH BRITE TIP 5FRX11 (SHEATH) ×6 IMPLANT
SHEATH BRITE TIP 8FRX11 (SHEATH) ×6 IMPLANT
SHEATH DRYSEAL FLEX 12FR 33CM (SHEATH) ×1 IMPLANT
SHEATH DRYSEAL FLEX 16FR 33CM (SHEATH) ×1 IMPLANT
STENT GRAFT CONTRALAT 16X11.5 (Endovascular Graft) ×1 IMPLANT
STENT GRAFT CONTRALAT 16X12X10 (Vascular Products) ×1 IMPLANT
STENT VIABAHN 9X5X120 (Permanent Stent) ×3 IMPLANT
TUBING CONTRAST HIGH PRESS 72 (TUBING) ×3 IMPLANT
WIRE AMPLATZ SSTIFF .035X260CM (WIRE) ×6 IMPLANT
WIRE J 3MM .035X145CM (WIRE) ×6 IMPLANT

## 2019-12-25 SURGICAL SUPPLY — 62 items
APPLIER CLIP 11 MED OPEN (CLIP) ×3
APPLIER CLIP 13 LRG OPEN (CLIP) ×3
APPLIER CLIP 9.375 SM OPEN (CLIP) ×3
BAG DECANTER FOR FLEXI CONT (MISCELLANEOUS) ×3 IMPLANT
BLADE SURG 15 STRL LF DISP TIS (BLADE) ×2 IMPLANT
BLADE SURG 15 STRL SS (BLADE) ×4
BLADE SURG SZ11 CARB STEEL (BLADE) ×3 IMPLANT
BOOT SUTURE AID YELLOW STND (SUTURE) ×6 IMPLANT
BRUSH SCRUB EZ  4% CHG (MISCELLANEOUS) ×2
BRUSH SCRUB EZ 4% CHG (MISCELLANEOUS) ×1 IMPLANT
CLIP APPLIE 11 MED OPEN (CLIP) ×1 IMPLANT
CLIP APPLIE 13 LRG OPEN (CLIP) ×1 IMPLANT
CLIP APPLIE 9.375 SM OPEN (CLIP) ×1 IMPLANT
COVER WAND RF STERILE (DRAPES) ×3 IMPLANT
DERMABOND ADVANCED (GAUZE/BANDAGES/DRESSINGS) ×4
DERMABOND ADVANCED .7 DNX12 (GAUZE/BANDAGES/DRESSINGS) ×2 IMPLANT
ELECT CAUTERY BLADE 6.4 (BLADE) ×6 IMPLANT
ELECT REM PT RETURN 9FT ADLT (ELECTROSURGICAL) ×6
ELECTRODE REM PT RTRN 9FT ADLT (ELECTROSURGICAL) ×2 IMPLANT
GAUZE 4X4 16PLY RFD (DISPOSABLE) ×6 IMPLANT
GLOVE BIO SURGEON STRL SZ7 (GLOVE) ×3 IMPLANT
GLOVE BIO SURGEON STRL SZ8 (GLOVE) ×3 IMPLANT
GOWN STRL REUS W/ TWL LRG LVL3 (GOWN DISPOSABLE) ×2 IMPLANT
GOWN STRL REUS W/ TWL XL LVL3 (GOWN DISPOSABLE) ×2 IMPLANT
GOWN STRL REUS W/TWL LRG LVL3 (GOWN DISPOSABLE) ×4
GOWN STRL REUS W/TWL XL LVL3 (GOWN DISPOSABLE) ×4
HEMOSTAT SURGICEL 2X3 (HEMOSTASIS) ×6 IMPLANT
IV NS 1000ML (IV SOLUTION) ×2
IV NS 1000ML BAXH (IV SOLUTION) ×1 IMPLANT
LABEL OR SOLS (LABEL) ×3 IMPLANT
LOOP RED MAXI  1X406MM (MISCELLANEOUS) ×8
LOOP VESSEL MAXI 1X406 RED (MISCELLANEOUS) ×4 IMPLANT
LOOP VESSEL MINI 0.8X406 BLUE (MISCELLANEOUS) ×2 IMPLANT
LOOPS BLUE MINI 0.8X406MM (MISCELLANEOUS) ×4
NDL SAFETY ECLIPSE 18X1.5 (NEEDLE) ×1 IMPLANT
NEEDLE HYPO 18GX1.5 SHARP (NEEDLE) ×2
NEEDLE HYPO 25X1 1.5 SAFETY (NEEDLE) ×3 IMPLANT
PACK BASIN MAJOR ARMC (MISCELLANEOUS) ×3 IMPLANT
PENCIL ELECTRO HAND CTR (MISCELLANEOUS) ×3 IMPLANT
SPONGE LAP 18X18 RF (DISPOSABLE) ×6 IMPLANT
SUT MNCRL 4-0 (SUTURE) ×8
SUT MNCRL 4-0 27XMFL (SUTURE) ×4
SUT MNCRL+ 5-0 UNDYED PC-3 (SUTURE) ×2 IMPLANT
SUT MONOCRYL 5-0 (SUTURE) ×4
SUT PROLENE 5 0 RB 1 DA (SUTURE) ×12 IMPLANT
SUT PROLENE 6 0 BV (SUTURE) ×30 IMPLANT
SUT SILK 2 0 (SUTURE) ×2
SUT SILK 2-0 18XBRD TIE 12 (SUTURE) ×1 IMPLANT
SUT SILK 3 0 (SUTURE) ×2
SUT SILK 3-0 18XBRD TIE 12 (SUTURE) ×1 IMPLANT
SUT SILK 4 0 (SUTURE) ×2
SUT SILK 4-0 18XBRD TIE 12 (SUTURE) ×1 IMPLANT
SUT VIC AB 2-0 CT1 (SUTURE) ×18 IMPLANT
SUT VIC AB 4-0 SH 27 (SUTURE) ×4
SUT VIC AB 4-0 SH 27XANBCTRL (SUTURE) ×2 IMPLANT
SUT VICRYL+ 3-0 36IN CT-1 (SUTURE) ×18 IMPLANT
SUTURE MNCRL 4-0 27XMF (SUTURE) ×4 IMPLANT
SYR 10ML LL (SYRINGE) ×3 IMPLANT
SYR 20ML LL LF (SYRINGE) ×6 IMPLANT
SYR 3ML LL SCALE MARK (SYRINGE) ×3 IMPLANT
SYR BULB 3OZ (MISCELLANEOUS) ×3 IMPLANT
TOWEL OR 17X26 4PK STRL BLUE (TOWEL DISPOSABLE) ×3 IMPLANT

## 2019-12-25 NOTE — Anesthesia Postprocedure Evaluation (Signed)
Anesthesia Post Note  Patient: Nathaniel Gamble  Procedure(s) Performed: ABDOMINAL AORTIC ENDOVASCULAR STENT GRAFT (N/A )  Patient location during evaluation: PACU Anesthesia Type: General Level of consciousness: awake and alert and oriented Pain management: pain level controlled Vital Signs Assessment: post-procedure vital signs reviewed and stable Respiratory status: spontaneous breathing, nonlabored ventilation and respiratory function stable Cardiovascular status: blood pressure returned to baseline and stable Postop Assessment: no signs of nausea or vomiting Anesthetic complications: no     Last Vitals:  Vitals:   12/25/19 1029 12/25/19 1040  BP: (!) 123/46 140/62  Pulse: (!) 57   Resp: 12 15  Temp: (!) 36.1 C   SpO2: 95% 96%    Last Pain:  Vitals:   12/25/19 1029  TempSrc:   PainSc: 0-No pain                 Drayce Tawil

## 2019-12-25 NOTE — Op Note (Signed)
OPERATIVE NOTE   PROCEDURE: 1. US guidance for vascular access, bilateral femoral arteries 2. Catheter placement into aorta from bilateral femoral approaches 3. Placement of a 26 mm diameter by 12 cm distal 18 cm length Gore Excluder Endoprosthesis main body left with a 16 mm diameter by 12 cm length right contralateral limb 4. Placement of a 9 mm diameter by 5 cm length Viabahn stent for occlusive disease in the right external iliac artery 5. Placement of a 12 mm diameter by 10 cm length iliac extension to the mid left external iliac artery 6. ProGlide closure devices bilateral femoral arteries  PRE-OPERATIVE DIAGNOSIS: AAA, peripheral arterial disease with claudication with iliac artery occlusive disease  POST-OPERATIVE DIAGNOSIS: same  SURGEON: Leotis Pain, MD and Hortencia Pilar, MD - Co-surgeons  ANESTHESIA: General  ESTIMATED BLOOD LOSS: 25 cc  FINDING(S): 1.  AAA 2.  80 to 85% right external iliac artery stenosis  SPECIMEN(S):  none  INDICATIONS:   Nathaniel Gamble is a 84 y.o. male who presents with greater than 5 cm infrarenal abdominal aortic aneurysm with very irregular wall and risk for rupture.  He also had claudication symptoms with iliac artery occlusive disease suggested on the CT scan. The anatomy was suitable for endovascular repair.  Risks and benefits of repair in an endovascular fashion were discussed and informed consent was obtained. Co-surgeons are used to expedite the procedure and reduce operative time as bilateral work needs to be done.  DESCRIPTION: After obtaining full informed written consent, the patient was brought back to the operating room and placed supine upon the operating table.  The patient received IV antibiotics prior to induction.  After obtaining adequate anesthesia, the patient was prepped and draped in the standard fashion for endovascular AAA repair.  We then began by gaining access to both femoral arteries with US guidance with me  working on the left and Dr. Delana Meyer working on the right.  The femoral arteries were found to be patent and accessed without difficulty with a needle under ultrasound guidance without difficulty on each side and permanent images were recorded.  We then placed 2 proglide devices on each side in a pre-close fashion and placed 8 French sheaths. The patient was then given 4000 units of intravenous heparin. The Pigtail catheter was placed into the aorta from the right side. Using this image, we selected a 26 mm diameter by 18 cm length Main body device.  Over a stiff wire, an 16 French sheath was placed up the left. The main body was then placed through the 16 French sheath. A Kumpe catheter was placed up the right side and a magnified image at the renal arteries was performed. The main body was then deployed just below the lowest renal artery which was the inferior of the 2 left renal arteries with the previously placed stent. The Kumpe catheter was used to cannulate the contralateral gate without difficulty and successful cannulation was confirmed by twirling the pigtail catheter in the main body. We then placed a stiff wire and a retrograde arteriogram was performed through the right femoral sheath. We upsized to the 12 [place] sheath on the right for the contralateral limb and a 16 mm diameter by 12 cm length right iliac limb was selected and deployed just above the right hypogastric artery.  It was also noted on the oblique arteriogram that the right external iliac artery in the midsegment had about an 80 to 85% stenosis.  This occlusive disease in the right  external iliac artery was then addressed with a 9 mm diameter by 5 cm length Viabahn stent deployed in the right external iliac artery distal to the hypogastric artery taking care to avoid injury as this was the only hypogastric artery patent.  This was postdilated with an 8 mm diameter high-pressure angioplasty balloon with less than 10% residual stenosis after  stent placement in the right external iliac artery.  The main body deployment was then completed. Based off the angiographic findings, extension limbs were necessary as the distal endpoint of the main body on the left was just into the external iliac artery with insufficient seal.  The left hypogastric artery was occluded but the common iliac artery was more ectatic and we did not have adequate seal.  A 12 mm diameter by 10 cm length left iliac extension limb was taken down to the mid external iliac artery and then postdilated. All junction points and seals zones were treated with the compliant balloon. The pigtail catheter was then replaced and a completion angiogram was performed.  Initially, it was undetermined whether or not a type I endoleak was present so an additional inflation of the compliant balloon at the proximal endpoint was done and magnified images both in AP and oblique projection performed proximally with no type I endoleak seen.  A type II endoleak was detected on completion angiography. The renal arteries were found to be widely patent.  The right hypogastric artery had good flow in the left hypogastric artery was known to be occluded. At this point we elected to terminate the procedure. We secured the pro glide devices for hemostasis on the femoral arteries. The skin incision was closed with a 4-0 Monocryl. Dermabond and pressure dressing were placed. The patient was taken to the recovery room in stable condition having tolerated the procedure well.  COMPLICATIONS: none  CONDITION: stable  Leotis Pain  12/25/2019, 9:49 AM   This note was created with Dragon Medical transcription system. Any errors in dictation are purely unintentional.

## 2019-12-25 NOTE — H&P (Signed)
[facility] History & Physical Update  The patient was interviewed and re-examined.  The patient's previous History and Physical has been reviewed and is unchanged.  There is no change in the plan of care. We plan to proceed with the scheduled procedure.  Leotis Pain, MD  12/25/2019, 7:25 AM

## 2019-12-25 NOTE — Progress Notes (Signed)
[place] visited pt. while rounding in ICU; met son Nathaniel Gamble) briefly in ICU waiting area; Pt. (goes by Nathaniel Gamble') in bed sitting up, awake and alert; son @ bedside.  Pt. shared  he come to [facility] to receive preventative treatment for an aneurysm.  Pt. said procedure went well; awaiting transfer to stepdown unit.  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  Pt. attends [employer] in [place]; appears well supported by family and church community.  No needs expressed at this time.       12/25/19 1445  Clinical Encounter Type  Visited With Patient and family together  Visit Type Initial;Critical Care;Social support  Referral From Other (Comment) (Routine)  Spiritual Encounters  Spiritual Needs Emotional  Stress Factors  Patient Stress Factors Health changes  Family Stress Factors Health changes

## 2019-12-25 NOTE — Anesthesia Preprocedure Evaluation (Signed)
Anesthesia Evaluation  Patient identified by MRN, date of birth, ID band Patient awake    Reviewed: Allergy & Precautions, NPO status , Patient's Chart, lab work & pertinent test results  History of Anesthesia Complications Negative for: history of anesthetic complications  Airway Mallampati: II  TM Distance: <3 FB Neck ROM: Full    Dental  (+) Poor Dentition, Implants   Pulmonary neg sleep apnea, neg COPD, former smoker,    breath sounds clear to auscultation- rhonchi (-) wheezing      Cardiovascular hypertension, Pt. on medications + CAD, + Past MI, + CABG (2010) and + Peripheral Vascular Disease (AAA)   Rhythm:Regular Rate:Normal - Systolic murmurs and - Diastolic murmurs    Neuro/Psych neg Seizures CVA, No Residual Symptoms negative psych ROS   GI/Hepatic negative GI ROS, Neg liver ROS,   Endo/Other  diabetes  Renal/GU Renal InsufficiencyRenal disease     Musculoskeletal  (+) Arthritis ,   Abdominal (+) - obese,   Peds  Hematology negative hematology ROS (+)   Anesthesia Other Findings Past Medical History: No date: Abdominal aortic aneurysm (HCC)     Comment:  monitored by dr. Ubaldo Glassing No date: Abdominal aortic aneurysm ([place]) 1976: Cancer ([place])     Comment:  melanoma, under right arm, removed 2011: Chronic kidney disease     Comment:  stent right kidney No date: Coronary artery disease No date: Diabetes mellitus without complication (HCC) No date: Hypertension No date: Jaw fracture (HCC) No date: Myocardial infarction ([place]) No date: Peripheral vascular disease (HCC) No date: Renal artery stenosis (HCC) No date: Stroke [facility])   Reproductive/Obstetrics                             Anesthesia Physical Anesthesia Plan  ASA: III  Anesthesia Plan: General   Post-op Pain Management:    Induction: Intravenous  PONV Risk Score and Plan: 1 and Ondansetron and  Dexamethasone  Airway Management Planned: Oral ETT  Additional Equipment: Arterial line  Intra-op Plan:   Post-operative Plan: Extubation in OR  Informed Consent: I have reviewed the patients History and Physical, chart, labs and discussed the procedure including the risks, benefits and alternatives for the proposed anesthesia with the patient or authorized representative who has indicated his/her understanding and acceptance.     Dental advisory given  Plan Discussed with: CRNA and Anesthesiologist  Anesthesia Plan Comments:         Anesthesia Quick Evaluation

## 2019-12-25 NOTE — Transfer of Care (Signed)
Immediate Anesthesia Transfer of Care Note  Patient: Nathaniel Gamble  Procedure(s) Performed: ABDOMINAL AORTIC ENDOVASCULAR STENT GRAFT (N/A )  Patient Location: PACU  Anesthesia Type:General  Level of Consciousness: awake  Airway & Oxygen Therapy: Patient connected to face mask oxygen  Post-op Assessment: Post -op Vital signs reviewed and stable  Post vital signs: stable  Last Vitals:  Vitals Value Taken Time  BP 155/56   Temp 97.2   Pulse 69   Resp 26   SpO2 99     Last Pain:  Vitals:   12/25/19 0640  TempSrc: Rectal         Complications: No apparent anesthesia complications

## 2019-12-25 NOTE — Op Note (Signed)
OPERATIVE NOTE   PROCEDURE: 1. US guidance for vascular access, bilateral femoral arteries 2. Catheter placement into aorta from bilateral femoral approaches 3. Placement of a  28 x 12 x 18 C3 Gore Excluder Endoprosthesis main body with a 16 x 12 contralateral limb 4. Placement of a 12 x 10 iliac extender into the external iliac on the left. 5. Placement of a 9 x 50 Viabahn right external iliac for treatment of occlusive disease 6. ProGlide closure devices bilateral femoral arteries  PRE-OPERATIVE DIAGNOSIS: AAA nonruptured; atherosclerotic occlusive disease bilateral lower extremities with lifestyle limiting claudication right leg  POST-OPERATIVE DIAGNOSIS: same  SURGEON: Hortencia Pilar, MD and Leotis Pain, MD - Co-surgeons  ANESTHESIA: general  ESTIMATED BLOOD LOSS: 50 cc  FINDING(S): 1.  AAA; greater than 80% stenosis mid external iliac artery on the right  SPECIMEN(S):  none  INDICATIONS:   Nathaniel Gamble is a 84 y.o. y.o. male who presents with increased in size of an infrarenal abdominal aortic aneurysm.  He now exceeds 5 cm and is at risk for lethal rupture.  Is also not lifestyle limiting claudication.  He is a candidate for endovascular repair and therefore has elected to pursue stent graft placement to prevent lethal rupture..  DESCRIPTION: After obtaining full informed written consent, the patient was brought back to the operating room and placed supine upon the operating table.  The patient received IV antibiotics prior to induction.  After obtaining adequate anesthesia, the patient was prepped and draped in the standard fashion for endovascular AAA repair.  Co-surgeons are required because this is a complex bilateral procedure with work being performed simultaneously from both the right femoral and left femoral approach.  This also expedites the procedure making a shorter operative time reducing complications and improving patient safety.  We then began by gaining  access to both femoral arteries with US guidance with me working on the patient's right and Dr. Lucky Cowboy working on the patient's left.  The femoral arteries were found to be patent and accessed without difficulty with a needle under ultrasound guidance without difficulty on each side and permanent images were recorded.  We then placed 2 proglide devices on each side in a pre-close fashion and placed 8 French sheaths.  The patient was then given 4000 units of intravenous heparin.   The Pigtail catheter was placed into the aorta from the right side. Using this image, we selected a 26 x 12 x 18 Main body device.  Over a stiff wire, an 82 French sheath was placed. The main body was then placed through the 18 French sheath. A Kumpe catheter was placed up the right side and a magnified image at the renal arteries was performed. The main body was then deployed just below the lowest renal artery. The Kumpe catheter was used to cannulate the contralateral gate without difficulty and successful cannulation was confirmed by twirling the pigtail catheter in the main body. We then placed a stiff wire and a retrograde arteriogram was performed through the right femoral sheath. We upsized to the 12 [place] sheath for the contralateral limb and a 16 x 12 limb was selected and deployed. The main body deployment was then completed. Based off the angiographic findings, extension limbs were necessary on the left.  A 12 x 10 extender limb was then selected and opened onto the field was deployed on the left side extending to the mid to distal left external iliac artery.  It was postdilated with an 8 mm  balloon. All junction points and seals zones were treated with the compliant balloon.  A critical lesion was noted in the mid external iliac on the right when the oblique view was obtained for selecting the contralateral limb.  Given the patient's symptoms as well as the absolute need for proper outflow to maintain patency of the limb we  elected to move forward with treating this 80% stenosis.  A 9 mm x 50 mm Viabahn stent was selected and deployed across this lesion.  It was postdilated with an 8 mm [place] balloon inflated to 14 atm.  Follow-up imaging demonstrated less than 5% residual stenosis.  The pigtail catheter was then replaced and a completion angiogram was performed.   Uncertain type of endoleak was detected on completion angiography. The renal arteries were found to be widely patent.  Therefore the [facility] balloon was advanced up the right side and the proximal stent graft was reangioplastied with a Coda balloon.  Follow-up imaging in both AP as well as oblique orientation is now demonstrated no type I endoleak there is a delayed type II endoleak noted.  At this point we elected to terminate the procedure. We secured the pro glide devices for hemostasis on the femoral arteries. The skin incision was closed with a 4-0 Monocryl. Dermabond and pressure dressing were placed. The patient was taken to the recovery room in stable condition having tolerated the procedure well.  COMPLICATIONS: none  CONDITION: stable  Hortencia Pilar  12/25/2019, 9:42 AM

## 2019-12-25 NOTE — Anesthesia Procedure Notes (Signed)
Arterial Line Insertion Start/End2/08/2020 7:49 AM, 12/25/2019 7:53 AM Performed by: Emmie Niemann, MD, anesthesiologist  Preanesthetic checklist: patient identified, IV checked, site marked, risks and benefits discussed, surgical consent, monitors and equipment checked, pre-op evaluation, timeout performed and anesthesia consent Right, radial was placed Catheter size: 20 G Hand hygiene performed  and Seldinger technique used Allen's test indicative of satisfactory collateral circulation Attempts: 2 Procedure performed without using ultrasound guided technique. Following insertion, dressing applied and Biopatch. Post procedure assessment: normal  Patient tolerated the procedure well with no immediate complications.

## 2019-12-25 NOTE — Anesthesia Procedure Notes (Signed)
Procedure Name: Intubation Date/Time: 12/25/2019 7:41 AM Performed by: Aline Brochure, CRNA Pre-anesthesia Checklist: Patient identified, Patient being monitored, Timeout performed, Emergency Drugs available and Suction available Patient Re-evaluated:Patient Re-evaluated prior to induction Oxygen Delivery Method: Circle system utilized Preoxygenation: Pre-oxygenation with 100% oxygen Induction Type: IV induction Ventilation: Two handed mask ventilation required and Oral airway inserted - appropriate to patient size Laryngoscope Size: McGraph and 4 (McGrath MAC 4) Grade View: Grade I Tube type: Oral Tube size: 7.5 mm Number of attempts: 1 Airway Equipment and Method: Stylet Placement Confirmation: ETT inserted through vocal cords under direct vision,  positive ETCO2 and breath sounds checked- equal and bilateral Secured at: 23 cm Tube secured with: Tape Dental Injury: Teeth and Oropharynx as per pre-operative assessment

## 2019-12-26 DIAGNOSIS — Z95828 Presence of other vascular implants and grafts: Secondary | ICD-10-CM

## 2019-12-26 LAB — CBC
HCT: 34.8 % — ABNORMAL LOW (ref 39.0–52.0)
Hemoglobin: 11.5 g/dL — ABNORMAL LOW (ref 13.0–17.0)
MCH: 29.5 pg (ref 26.0–34.0)
MCHC: 33 g/dL (ref 30.0–36.0)
MCV: 89.2 fL (ref 80.0–100.0)
Platelets: 144 10*3/uL — ABNORMAL LOW (ref 150–400)
RBC: 3.9 MIL/uL — ABNORMAL LOW (ref 4.22–5.81)
RDW: 14.6 % (ref 11.5–15.5)
WBC: 12.6 10*3/uL — ABNORMAL HIGH (ref 4.0–10.5)
nRBC: 0 % (ref 0.0–0.2)

## 2019-12-26 LAB — BASIC METABOLIC PANEL
Anion gap: 7 (ref 5–15)
BUN: 24 mg/dL — ABNORMAL HIGH (ref 8–23)
CO2: 23 mmol/L (ref 22–32)
Calcium: 8.5 mg/dL — ABNORMAL LOW (ref 8.9–10.3)
Chloride: 108 mmol/L (ref 98–111)
Creatinine, Ser: 1.27 mg/dL — ABNORMAL HIGH (ref 0.61–1.24)
GFR calc Af Amer: >60 mL/min (ref >60)
GFR calc non Af Amer: 53 mL/min — ABNORMAL LOW (ref >60)
Glucose, Bld: 128 mg/dL — ABNORMAL HIGH (ref 70–99)
Potassium: 4.3 mmol/L (ref 3.5–5.1)
Sodium: 138 mmol/L (ref 135–145)

## 2019-12-26 LAB — MAGNESIUM: Magnesium: 2 mg/dL (ref 1.7–2.4)

## 2019-12-26 MED ORDER — ACETAMINOPHEN 325 MG PO TABS: 325.0000 mg | ORAL_TABLET | ORAL | Status: AC | PRN

## 2019-12-26 MED ORDER — ASPIRIN 81 MG PO TBEC: 81.0000 mg | DELAYED_RELEASE_TABLET | Freq: Every day | ORAL | Status: AC

## 2019-12-26 NOTE — Discharge Summary (Signed)
[facility]    Discharge Summary  Patient ID:  Nathaniel Gamble MRN: [id number] DOB/AGE: [birth year] 84 y.o.  Admit date: 12/25/2019 Discharge date: 12/26/2019 Date of Surgery: 12/25/2019 Surgeon: Surgeon(s): Dew, Erskine Squibb, MD Schnier, Dolores Lory, MD  Admission Diagnosis: AAA (abdominal aortic aneurysm) without rupture [facility]) [I71.4]  Discharge Diagnoses:  AAA (abdominal aortic aneurysm) without rupture [facility]) [I71.4]  Secondary Diagnoses: Past Medical History:  Diagnosis Date  . Abdominal aortic aneurysm ([place])    monitored by dr. Ubaldo Glassing  . Abdominal aortic aneurysm ([place])   . Cancer ([place]) 1976   melanoma, under right arm, removed  . Chronic kidney disease 2011   stent right kidney  . Coronary artery disease   . Diabetes mellitus without complication ([place])   . Hypertension   . Jaw fracture ([place])   . Myocardial infarction ([place])   . Peripheral vascular disease ([place])   . Renal artery stenosis ([place])   . Stroke [facility])    Procedure(s): ABDOMINAL AORTIC ENDOVASCULAR STENT GRAFT  Discharged Condition: Good  HPI / Hospital Course:  Nathaniel Gamble is a 84 y.o. male who presents with greater than 5cm infrarenal abdominal aortic aneurysm with very irregular wall and risk for rupture.  He also had claudication symptoms with iliac artery occlusive disease suggested on the CT scan. The anatomy was suitable for endovascular repair.  Risks and benefits of repair in an endovascular fashion were discussed and informed consent was obtained. On 12/25/19, the patient underwent:  Procedure(s): 1. US guidance for vascular access, bilateral femoral arteries 2. Catheter placement into aorta from bilateral femoral approaches 3. Placement of a 26 mm diameter by 12 cm distal 18 cm length Gore Excluder Endoprosthesis main body left with a 16 mm diameter by 12 cm length right contralateral limb 4. Placement of a 9 mm diameter by 5 cm length Viabahn stent for occlusive disease in  the right external iliac artery 5. Placement of a 12 mm diameter by 10 cm length iliac extension to the mid left external iliac artery 6. ProGlide closure devices bilateral femoral arteries  He tolerated the procedure well was transferred from the recovery room to the ICU for observation overnight.  The patient's night of surgery was unremarkable.  During the patient's brief stay, his diet was advanced, his Foley was removed and he was urinating independently, his pain was controlled with the use of p.o. pain medication and he was ambulating at baseline.  On the day of discharge, the patient was afebrile with stable vital signs with an essentially unremarkable physical exam.  Extubated: POD # 0  Physical exam:  Alert and oriented x3 Cardiovascular: Regular rate and rhythm Pulmonary: Clear to auscultation bilaterally Abdomen: Soft, nontender, nondistended positive bowel sounds Right groin:   Access site: Clean dry intact.  No signs of drainage or swelling. Left groin:   Access site: Clean dry intact.  No signs of drainage or swelling. Extremities:  Bilateral: Thigh soft.  Calf soft.  Extremity distally warm to toes.  Motor/sensory is intact Neuro: Intact  Labs as below  Complications: None  Consults: None  Significant Diagnostic Studies: CBC Lab Results  Component Value Date   WBC 12.6 (H) 12/26/2019   HGB 11.5 (L) 12/26/2019   HCT 34.8 (L) 12/26/2019   MCV 89.2 12/26/2019   PLT 144 (L) 12/26/2019   BMET    Component Value Date/Time   NA 138 12/26/2019 0400   K 4.3 12/26/2019 0400   CL 108 12/26/2019 0400  CO2 23 12/26/2019 0400   GLUCOSE 128 (H) 12/26/2019 0400   BUN 24 (H) 12/26/2019 0400   CREATININE 1.27 (H) 12/26/2019 0400   CALCIUM 8.5 (L) 12/26/2019 0400   GFRNONAA 53 (L) 12/26/2019 0400   GFRAA >60 12/26/2019 0400   COAG Lab Results  Component Value Date   INR 1.4 (H) 12/23/2019   INR 1.4 (H) 07/20/2019   INR 1.05 05/10/2018   Disposition:  Discharge  to :Home  Allergies as of 12/26/2019      Reactions   Oxycodone Nausea Only   Lisinopril Swelling   Tetanus Toxoids Swelling      Medication List    TAKE these medications   acetaminophen 325 MG tablet Commonly known as: TYLENOL Take 1-2 tablets (325-650 mg total) by mouth every 4 (four) hours as needed for mild pain (or temp >/= 101 F). What changed:   how much to take  how to take this  when to take this  reasons to take this   amLODipine 10 MG tablet Commonly known as: NORVASC Place 10 mg into feeding tube daily.   aspirin 81 MG EC tablet Take 1 tablet (81 mg total) by mouth daily at 6 (six) AM. Start taking on: December 27, 2019   Baclofen 5 MG Tabs Take by mouth as needed.   carvedilol 25 MG tablet Commonly known as: COREG Place 25 mg into feeding tube 2 (two) times daily with a meal.   Eliquis 5 MG Tabs tablet Generic drug: apixaban Place 5 mg into feeding tube 2 (two) times daily.   hydrALAZINE 10 MG tablet Commonly known as: APRESOLINE Take 10 mg by mouth Nightly.   ISOSOURCE PO Take by mouth 5 (five) times daily.   losartan 100 MG tablet Commonly known as: COZAAR Take 100 mg by mouth daily.   multivitamin-lutein Caps capsule Take 1 capsule by mouth daily.   scopolamine 1 MG/3DAYS Commonly known as: TRANSDERM-SCOP Place 1 patch onto the skin every 3 (three) days as needed.   senna 8.6 MG tablet Commonly known as: SENOKOT Take 1 tablet by mouth 2 (two) times daily as needed for constipation.   simvastatin 40 MG tablet Commonly known as: ZOCOR Take 40 mg by mouth at bedtime.   Vitamin D (Ergocalciferol) 1.25 MG (50000 UNIT) Caps capsule Commonly known as: DRISDOL Take 50,000 Units by mouth once a week.      Verbal and written Discharge instructions given to the patient. Wound care per Discharge AVS Follow-up Information    Kris Hartmann, NP Follow up in 1 week(s).   Specialty: Vascular Surgery Why: First Post-Op Check. No studies  needed.  Contact information: [place] [address] [phone]          Signed: Sela Hua, PA-C  12/26/2019, 10:18 AM

## 2019-12-26 NOTE — Plan of Care (Signed)
Pt discharged home, Vascular sites level 0, mild bruise on left site, PADs off and bandaids applied, IV sites DCd, bleeding controlled, pt ambulated around nurses' station on RA, VSS.  DC instructions given and understanding verbalized, Will leave hospital in car with his son.

## 2019-12-26 NOTE — Discharge Instructions (Signed)
1) You may shower as of Friday.  Please keep your groins clean and dry. 2) No heavy lifting greater than 10 pounds or strenuous activity until cleared during your first postoperative visit. 3) No driving until you are cleared during your first postoperative visit.

## 2020-01-02 ENCOUNTER — Ambulatory Visit (INDEPENDENT_AMBULATORY_CARE_PROVIDER_SITE_OTHER): Payer: BC Managed Care – PPO | Admitting: Nurse Practitioner

## 2020-01-03 ENCOUNTER — Ambulatory Visit (INDEPENDENT_AMBULATORY_CARE_PROVIDER_SITE_OTHER): Payer: BC Managed Care – PPO | Admitting: Nurse Practitioner

## 2020-01-03 ENCOUNTER — Other Ambulatory Visit: Payer: Self-pay

## 2020-01-03 ENCOUNTER — Encounter (INDEPENDENT_AMBULATORY_CARE_PROVIDER_SITE_OTHER): Payer: Self-pay | Admitting: Nurse Practitioner

## 2020-01-03 VITALS — BP 126/62 | HR 65 | Resp 16 | Wt 171.4 lb

## 2020-01-03 DIAGNOSIS — I714 Abdominal aortic aneurysm, without rupture, unspecified: Secondary | ICD-10-CM

## 2020-01-03 DIAGNOSIS — I10 Essential (primary) hypertension: Secondary | ICD-10-CM

## 2020-01-03 DIAGNOSIS — I739 Peripheral vascular disease, unspecified: Secondary | ICD-10-CM

## 2020-01-03 NOTE — Progress Notes (Signed)
SUBJECTIVE:  Patient ID: Nathaniel Gamble, male    DOB: 1936/07/24, 84 y.o.   MRN: [id number] Chief Complaint  Patient presents with  . Follow-up    Nathaniel Gamble post AAA    HPI  Nathaniel Gamble is a 84 y.o. male that presents today after abdominal aortic aneurysm stent graft repair.  Patient had several interventions:  PROCEDURE: 1. US guidance for vascular access, bilateral femoral arteries 2. Catheter placement into aorta from bilateral femoral approaches 3. Placement of a 26 mm diameter by 12 cm distal 18 cm length Gore Excluder Endoprosthesis main body left with a 16 mm diameter by 12 cm length right contralateral limb 4. Placement of a 9 mm diameter by 5 cm length Viabahn stent for occlusive disease in the right external iliac artery 5. Placement of a 12 mm diameter by 10 cm length iliac extension to the mid left external iliac artery 6. ProGlide closure devices bilateral femoral arteries  Overall today the patient feels well.  He endorses having a little pain after his procedure however at this time there is very little.  The patient is ambulating without difficulty.  He still has some swelling in the left groin area and a little tenderness but this is not greatly affecting his everyday life.  There are some bruising still at the bilateral groin sites.  The bilateral groin sites are well approximated with no signs symptoms of infection.  He denies any fever, chills, nausea, vomiting or diarrhea following procedure.  He denies any claudication-like symptoms or rest pain.  He denies any symptoms of peripheral embolization. Past Medical History:  Diagnosis Date  . Abdominal aortic aneurysm ([place])    monitored by dr. Ubaldo Gamble  . Abdominal aortic aneurysm (Nathaniel Gamble)   . Cancer (Nathaniel Gamble) 1976   melanoma, under right arm, removed  . Chronic kidney disease 2011   stent right kidney  . Coronary artery disease   . Diabetes mellitus without complication ([place])   . Hypertension   . Jaw fracture ([place])    . Myocardial infarction (Nathaniel Gamble)   . Peripheral vascular disease (Nathaniel Gamble)   . Renal artery stenosis (Nathaniel Gamble)   . Stroke Nathaniel Gamble)     Past Surgical History:  Procedure Laterality Date  . ABDOMINAL AORTIC ENDOVASCULAR STENT GRAFT N/A 12/25/2019   Procedure: ABDOMINAL AORTIC ENDOVASCULAR STENT GRAFT;  Surgeon: Algernon Huxley, MD;  Location: [facility];  Service: Vascular;  Laterality: N/A;  . CATARACT EXTRACTION Bilateral 2014  . CORONARY ARTERY BYPASS GRAFT  2010  . ENDOVASCULAR REPAIR/STENT GRAFT N/A 12/25/2019   Procedure: ENDOVASCULAR REPAIR/STENT GRAFT;  Surgeon: Algernon Huxley, MD;  Location: [facility];  Service: Cardiovascular;  Laterality: N/A;  . INGUINAL HERNIA REPAIR Left 07/09/2015   Procedure: HERNIA REPAIR INGUINAL ADULT;  Surgeon: Leonie Green, MD;  Location: [facility];  Service: General;  Laterality: Left;  . LOOP RECORDER INSERTION N/A 06/28/2018   Procedure: LOOP RECORDER INSERTION;  Surgeon: Isaias Cowman, MD;  Location: [facility];  Service: Cardiovascular;  Laterality: N/A;  . MANDIBLE FRACTURE SURGERY Left   . RENAL ARTERY STENT Right     Social History   Socioeconomic History  . Marital status: Married    Spouse name: Not on file  . Number of children: Not on file  . Years of education: Not on file  . Highest education level: Not on file  Occupational History  . Not on file  Tobacco Use  . Smoking status: Former Research scientist (life sciences)  .  Smokeless tobacco: Former Network engineer and Sexual Activity  . Alcohol use: Yes    Alcohol/week: 1.0 standard drinks    Types: 1 Glasses of wine per week  . Drug use: No  . Sexual activity: Not on file  Other Topics Concern  . Not on file  Social History Narrative  . Not on file   Social Determinants of Health   Financial Resource Strain: Low Risk   . Difficulty of Paying Living Expenses: Not hard at all  Food Insecurity: No Food Insecurity  . Worried About Charity fundraiser in the Last Year: Never true  . Ran  Out of Food in the Last Year: Never true  Transportation Needs:   . Lack of Transportation (Medical): Not on file  . Lack of Transportation (Non-Medical): Not on file  Physical Activity:   . Days of Exercise per Week: Not on file  . Minutes of Exercise per Session: Not on file  Stress:   . Feeling of Stress : Not on file  Social Connections: Unknown  . Frequency of Communication with Friends and Family: Not asked  . Frequency of Social Gatherings with Friends and Family: Not asked  . Attends Religious Services: Not asked  . Active Member of Clubs or Organizations: Not on file  . Attends Archivist Meetings: Not on file  . Marital Status: Not on file  Intimate Partner Violence:   . Fear of Current or Ex-Partner: Not on file  . Emotionally Abused: Not on file  . Physically Abused: Not on file  . Sexually Abused: Not on file    Family History  Problem Relation Age of Onset  . Hyperlipidemia Mother   . Stroke Mother   . Hyperlipidemia Father     Allergies  Allergen Reactions  . Oxycodone Nausea Only  . Lisinopril Swelling  . Tetanus Toxoids Swelling     Review of Systems   Review of Systems: Negative Unless Checked Constitutional: [] Weight loss  [] Fever  [] Chills Cardiac: [] Chest pain   []  Atrial Fibrillation  [] Palpitations   [] Shortness of breath when laying flat   [] Shortness of breath with exertion. [] Shortness of breath at rest Vascular:  [] Pain in legs with walking   [] Pain in legs with standing [] Pain in legs when laying flat   [] Claudication    [] Pain in feet when laying flat    [] History of DVT   [] Phlebitis   [] Swelling in legs   [] Varicose veins   [] Non-healing ulcers Pulmonary:   [] Uses home oxygen   [] Productive cough   [] Hemoptysis   [] Wheeze  [] COPD   [] Asthma Neurologic:  [] Dizziness   [] Seizures  [] Blackouts [x] History of stroke   [] History of TIA  [] Aphasia   [] Temporary Blindness   [] Weakness or numbness in arm   [] Weakness or numbness in  leg Musculoskeletal:   [] Joint swelling   [] Joint pain   [] Low back pain  []  History of Knee Replacement [x] Arthritis [] back Surgeries  []  Spinal Stenosis    Hematologic:  [] Easy bruising  [] Easy bleeding   [] Hypercoagulable state   [] Anemic Gastrointestinal:  [] Diarrhea   [] Vomiting  [] Gastroesophageal reflux/heartburn   [] Difficulty swallowing. [] Abdominal pain Genitourinary:  [] Chronic kidney disease   [] Difficult urination  [] Anuric   [] Blood in urine [] Frequent urination  [] Burning with urination   [] Hematuria Skin:  [] Rashes   [] Ulcers [] Wounds Psychological:  [] History of anxiety   []  History of major depression  []  Memory Difficulties      OBJECTIVE:  Physical Exam  BP 126/62 (BP Location: Right Arm)   Pulse 65   Resp 16   Wt 171 lb 6.4 oz (77.7 kg)   BMI 25.31 kg/m   Gen: WD/WN, NAD Head: [place]/AT, No temporalis wasting.  Ear/Nose/Throat: Hearing grossly intact, nares w/o erythema or drainage Eyes: PER, EOMI, sclera nonicteric.  Neck: Supple, no masses.  No JVD.  Pulmonary:  Good air movement, no use of accessory muscles.  Cardiac: RRR Vascular:  Well approximated groins, little bruising Vessel Right Left  Radial Palpable Palpable   Gastrointestinal: soft, non-distended. No guarding/no peritoneal signs.  Musculoskeletal: M/S 5/5 throughout.  No deformity or atrophy.  Neurologic: Pain and light touch intact in extremities.  Symmetrical.  Speech is fluent. Motor exam as listed above. Psychiatric: Judgment intact, Mood & affect appropriate for pt's clinical situation.        ASSESSMENT AND PLAN:  1. AAA (abdominal aortic aneurysm) without rupture ([place]) Bilateral groin sites look well and mostly healed.  Patient doing very well afterwards.  No longer having any pain just a little swelling in the left groin.  Patient return in 4 to 6 weeks with EVAR.  2. PAD (peripheral artery disease) (H[facility]) No complaints of claudication.  No signs symptoms of peripheral embolization.   We will check ABIs when patient returns in 4 to 6 weeks.  3. Essential hypertension Good blood pressure control today.  No changes made.   Current Outpatient Medications on File Prior to Visit  Medication Sig Dispense Refill  . acetaminophen (TYLENOL) 325 MG tablet Take 1-2 tablets (325-650 mg total) by mouth every 4 (four) hours as needed for mild pain (or temp >/= 101 F).    [name] amLODipine (NORVASC) 10 MG tablet Place 10 mg into feeding tube daily.     [name] apixaban (ELIQUIS) 5 MG TABS tablet Place 5 mg into feeding tube 2 (two) times daily.    [name] aspirin EC 81 MG EC tablet Take 1 tablet (81 mg total) by mouth daily at 6 (six) AM.    . carvedilol (COREG) 25 MG tablet Place 25 mg into feeding tube 2 (two) times daily with a meal.     . hydrALAZINE (APRESOLINE) 10 MG tablet Take 10 mg by mouth Nightly.    [name] losartan (COZAAR) 100 MG tablet Take 100 mg by mouth daily.     . multivitamin-lutein (OCUVITE-LUTEIN) CAPS capsule Take 1 capsule by mouth daily.    [name] senna (SENOKOT) 8.6 MG tablet Take 1 tablet by mouth 2 (two) times daily as needed for constipation.    . simvastatin (ZOCOR) 40 MG tablet Take 40 mg by mouth at bedtime.    . Vitamin D, Ergocalciferol, (DRISDOL) 50000 units CAPS capsule Take 50,000 Units by mouth once a week.   1  . Baclofen 5 MG TABS Take by mouth as needed.    . Nutritional Supplements (ISOSOURCE PO) Take by mouth 5 (five) times daily.    [name] scopolamine (TRANSDERM-SCOP) 1 MG/3DAYS Place 1 patch onto the skin every 3 (three) days as needed.     No current facility-administered medications on file prior to visit.    There are no Patient Instructions on file for this visit. No follow-ups on file.   Kris Hartmann, NP  This note was completed with Sales executive.  Any errors are purely unintentional.

## 2020-01-05 ENCOUNTER — Encounter: Payer: Self-pay | Admitting: *Deleted

## 2020-02-04 ENCOUNTER — Other Ambulatory Visit (INDEPENDENT_AMBULATORY_CARE_PROVIDER_SITE_OTHER): Payer: Self-pay | Admitting: Nurse Practitioner

## 2020-02-04 DIAGNOSIS — T82330A Leakage of aortic (bifurcation) graft (replacement), initial encounter: Secondary | ICD-10-CM

## 2020-02-04 DIAGNOSIS — I739 Peripheral vascular disease, unspecified: Secondary | ICD-10-CM

## 2020-02-04 DIAGNOSIS — IMO0001 Reserved for inherently not codable concepts without codable children: Secondary | ICD-10-CM

## 2020-02-05 ENCOUNTER — Ambulatory Visit (INDEPENDENT_AMBULATORY_CARE_PROVIDER_SITE_OTHER): Payer: BC Managed Care – PPO | Admitting: Nurse Practitioner

## 2020-02-05 ENCOUNTER — Encounter (INDEPENDENT_AMBULATORY_CARE_PROVIDER_SITE_OTHER): Payer: Self-pay | Admitting: Nurse Practitioner

## 2020-02-05 ENCOUNTER — Ambulatory Visit (INDEPENDENT_AMBULATORY_CARE_PROVIDER_SITE_OTHER): Payer: BC Managed Care – PPO

## 2020-02-05 ENCOUNTER — Other Ambulatory Visit: Payer: Self-pay

## 2020-02-05 VITALS — BP 161/74 | HR 55 | Ht 70.0 in | Wt 171.0 lb

## 2020-02-05 DIAGNOSIS — T82330A Leakage of aortic (bifurcation) graft (replacement), initial encounter: Secondary | ICD-10-CM

## 2020-02-05 DIAGNOSIS — I739 Peripheral vascular disease, unspecified: Secondary | ICD-10-CM | POA: Diagnosis not present

## 2020-02-05 DIAGNOSIS — I714 Abdominal aortic aneurysm, without rupture, unspecified: Secondary | ICD-10-CM

## 2020-02-05 DIAGNOSIS — IMO0001 Reserved for inherently not codable concepts without codable children: Secondary | ICD-10-CM

## 2020-02-05 DIAGNOSIS — I10 Essential (primary) hypertension: Secondary | ICD-10-CM | POA: Diagnosis not present

## 2020-02-05 NOTE — Progress Notes (Signed)
SUBJECTIVE:  Patient ID: Nathaniel Gamble, male    DOB: 04/29/1936, 84 y.o.   MRN: [id number] Chief Complaint  Patient presents with  . Follow-up    U/S Follow up    HPI  Nathaniel Gamble is a 84 y.o. male presents today for follow-up after abdominal aortic aneurysm repair on 12/25/2019.[name]   Patient denies abdominal pain or back pain, no other abdominal complaints. No groin related complaints. No symptoms consistent with distal embolization No changes in claudication distance.  The patient denies any rest pain or ulcerations of his lower extremities.  There have been no interval changes in his overall healthcare since his last visit.   Patient denies amaurosis fugax or TIA symptoms. There is no history of claudication or rest pain symptoms of the lower extremities. The patient denies angina or shortness of breath.   Duplex US of the aorta and iliac arteries shows a decrease in the sac size noted on CT on 09/28/2019.  The previous diameter then was 5.5 cm.  Today on ultrasound there is a atypical area of high velocity, pulsatile flow, which is possibly venous, that is noted anterior and adjacent to the distal aorta.  This is also flowing away from the aneurysm.  There was no flow visualized in the aneurysmal sac.  It was noted in the operative notes on 12/25/2019 the patient had a type II delayed endoleak.  Today the sac size measures 5.3 cm  The patient also underwent bilateral ABIs which revealed an ABI of 0.60 on the right and 0.71 on the left.  The patient has strong monophasic waveforms in the bilateral tibial arteries with strong toe waveforms bilaterally  Past Medical History:  Diagnosis Date  . Abdominal aortic aneurysm ([place])    monitored by dr. Ubaldo Glassing  . Abdominal aortic aneurysm ([place])   . Cancer ([place]) 1976   melanoma, under right arm, removed  . Chronic kidney disease 2011   stent right kidney  . Coronary artery disease   . Diabetes mellitus without complication ([place])   .  Hypertension   . Jaw fracture ([place])   . Myocardial infarction ([place])   . Peripheral vascular disease ([place])   . Renal artery stenosis ([place])   . Stroke [facility])     Past Surgical History:  Procedure Laterality Date  . ABDOMINAL AORTIC ENDOVASCULAR STENT GRAFT N/A 12/25/2019   Procedure: ABDOMINAL AORTIC ENDOVASCULAR STENT GRAFT;  Surgeon: Algernon Huxley, MD;  Location: [facility] ORS;  Service: Vascular;  Laterality: N/A;  . CATARACT EXTRACTION Bilateral 2014  . CORONARY ARTERY BYPASS GRAFT  2010  . ENDOVASCULAR REPAIR/STENT GRAFT N/A 12/25/2019   Procedure: ENDOVASCULAR REPAIR/STENT GRAFT;  Surgeon: Algernon Huxley, MD;  Location: [place] [facility];  Service: Cardiovascular;  Laterality: N/A;  . INGUINAL HERNIA REPAIR Left 07/09/2015   Procedure: HERNIA REPAIR INGUINAL ADULT;  Surgeon: Leonie Green, MD;  Location: [facility] ORS;  Service: General;  Laterality: Left;  . LOOP RECORDER INSERTION N/A 06/28/2018   Procedure: LOOP RECORDER INSERTION;  Surgeon: Isaias Cowman, MD;  Location: [place] [facility];  Service: Cardiovascular;  Laterality: N/A;  . MANDIBLE FRACTURE SURGERY Left   . RENAL ARTERY STENT Right     Social History   Socioeconomic History  . Marital status: Married    Spouse name: Not on file  . Number of children: Not on file  . Years of education: Not on file  . Highest education level: Not on file  Occupational History  .  Not on file  Tobacco Use  . Smoking status: Former Research scientist (life sciences)  . Smokeless tobacco: Former Network engineer and Sexual Activity  . Alcohol use: Yes    Alcohol/week: 1.0 standard drinks    Types: 1 Glasses of wine per week  . Drug use: No  . Sexual activity: Not on file  Other Topics Concern  . Not on file  Social History Narrative  . Not on file   Social Determinants of Health   Financial Resource Strain: Low Risk   . Difficulty of Paying Living Expenses: Not hard at all  Food Insecurity: No Food Insecurity  . Worried About Sales executive in the Last Year: Never true  . Ran Out of Food in the Last Year: Never true  Transportation Needs:   . Lack of Transportation (Medical):   [name] Lack of Transportation (Non-Medical):   Physical Activity:   . Days of Exercise per Week:   . Minutes of Exercise per Session:   Stress:   . Feeling of Stress :   Social Connections: Unknown  . Frequency of Communication with Friends and Family: Not asked  . Frequency of Social Gatherings with Friends and Family: Not asked  . Attends Religious Services: Not asked  . Active Member of Clubs or Organizations: Not on file  . Attends Archivist Meetings: Not on file  . Marital Status: Not on file  Intimate Partner Violence:   . Fear of Current or Ex-Partner:   . Emotionally Abused:   [name] Physically Abused:   . Sexually Abused:     Family History  Problem Relation Age of Onset  . Hyperlipidemia Mother   . Stroke Mother   . Hyperlipidemia Father     Allergies  Allergen Reactions  . Oxycodone Nausea Only  . Lisinopril Swelling  . Tetanus Toxoids Swelling     Review of Systems   Review of Systems: Negative Unless Checked Constitutional: [] Weight loss  [] Fever  [] Chills Cardiac: [] Chest pain   []  Atrial Fibrillation  [] Palpitations   [] Shortness of breath when laying flat   [] Shortness of breath with exertion. [] Shortness of breath at rest Vascular:  [] Pain in legs with walking   [] Pain in legs with standing [] Pain in legs when laying flat   [x] Claudication    [] Pain in feet when laying flat    [] History of DVT   [] Phlebitis   [] Swelling in legs   [] Varicose veins   [] Non-healing ulcers Pulmonary:   [] Uses home oxygen   [] Productive cough   [] Hemoptysis   [] Wheeze  [] COPD   [] Asthma Neurologic:  [] Dizziness   [] Seizures  [] Blackouts [] History of stroke   [] History of TIA  [] Aphasia   [] Temporary Blindness   [] Weakness or numbness in arm   [] Weakness or numbness in leg Musculoskeletal:   [] Joint swelling   [] Joint pain   [] Low  back pain  []  History of Knee Replacement [x] Arthritis [] back Surgeries  []  Spinal Stenosis    Hematologic:  [] Easy bruising  [] Easy bleeding   [] Hypercoagulable state   [] Anemic Gastrointestinal:  [] Diarrhea   [] Vomiting  [] Gastroesophageal reflux/heartburn   [] Difficulty swallowing. [] Abdominal pain Genitourinary:  [] Chronic kidney disease   [] Difficult urination  [] Anuric   [] Blood in urine [] Frequent urination  [] Burning with urination   [] Hematuria Skin:  [] Rashes   [] Ulcers [] Wounds Psychological:  [] History of anxiety   []  History of major depression  []  Memory Difficulties      OBJECTIVE:   Physical Exam  BP (!) 161/74   Pulse (!) 55   Ht 5\' 10"  (1.778 m)   Wt 171 lb (77.6 kg)   BMI 24.54 kg/m   Gen: WD/WN, NAD Head: [place]/AT, No temporalis wasting.  Ear/Nose/Throat: Hearing grossly intact, nares w/o erythema or drainage Eyes: PER, EOMI, sclera nonicteric.  Neck: Supple, no masses.  No JVD.  Pulmonary:  Good air movement, no use of accessory muscles.  Cardiac: RRR Vascular:  Minimal edema Vessel Right Left  Radial Palpable Palpable  Dorsalis Pedis  not palpable  not palpable  Posterior Tibial  not palpable  not palpable   Gastrointestinal: soft, non-distended. No guarding/no peritoneal signs.  Musculoskeletal: M/S 5/5 throughout.  No deformity or atrophy.  Neurologic: Pain and light touch intact in extremities.  Symmetrical.  Speech is fluent. Motor exam as listed above. Psychiatric: Judgment intact, Mood & affect appropriate for pt's clinical situation. Dermatologic: No Venous rashes. No Ulcers Noted.  No changes consistent with cellulitis. Lymph : No Cervical lymphadenopathy, no lichenification or skin changes of chronic lymphedema.       ASSESSMENT AND PLAN:  1. AAA (abdominal aortic aneurysm) without rupture (HCC) Today noninvasive images picked up a atypical area of high velocity, pulsatile flow that was noted anteriorly and adjacent to the distal aorta that is  also moving away from the aneurysm.  There was not any flow detected in the aneurysmal sac.  It was noted that the patient had a delayed endoleak during his procedure.  This finding may be consistent with the delayed endoleak however there is the possibility that this is pulsatile flow could be venous and not related to the endoleak.  The sac today shows a decrease in size from the previous CT that was done on 09/28/2019.  Based on the fact that the patient's aneurysm is not growing as well as there is no flow seen within the sac, I will plan on close follow-up versus CT scan.  However if at the next visit the sac show some growth or flow we will proceed with CT scan.  We will have the patient follow-up in 3 months with noninvasive studies.  No changes made to medications. - VAS [place] EVAR DUPLEX; Future  2. Peripheral arterial disease (HCC)  Recommend:  The patient has evidence of atherosclerosis of the lower extremities with claudication.  The patient does not voice lifestyle limiting changes at this point in time.  No invasive studies, angiography or surgery at this time The patient should continue walking and begin a more formal exercise program.  The patient should continue antiplatelet therapy and aggressive treatment of the lipid abnormalities  No changes in the patient's medications at this time  The patient should continue wearing graduated compression socks 10-15 mmHg strength to control the mild edema.  We will continue to monitor on a 68-month basis, patient will contact us sooner if he begins to have more pain  3. Essential hypertension Continue antihypertensive medications as already ordered, these medications have been reviewed and there are no changes at this time.    Current Outpatient Medications on File Prior to Visit  Medication Sig Dispense Refill  . apixaban (ELIQUIS) 5 MG TABS tablet Place 5 mg into feeding tube 2 (two) times daily.    . carvedilol (COREG) 25 MG tablet  Place 25 mg into feeding tube 2 (two) times daily with a meal.     . hydrALAZINE (APRESOLINE) 10 MG tablet Take 10 mg by mouth Nightly.    [name] losartan (COZAAR)  100 MG tablet Take 100 mg by mouth daily.     . multivitamin-lutein (OCUVITE-LUTEIN) CAPS capsule Take 1 capsule by mouth daily.    . simvastatin (ZOCOR) 40 MG tablet Take 40 mg by mouth at bedtime.    . Vitamin D, Ergocalciferol, (DRISDOL) 50000 units CAPS capsule Take 50,000 Units by mouth once a week.   1  . acetaminophen (TYLENOL) 325 MG tablet Take 1-2 tablets (325-650 mg total) by mouth every 4 (four) hours as needed for mild pain (or temp >/= 101 F). (Patient not taking: Reported on 02/05/2020)    . amLODipine (NORVASC) 10 MG tablet Place 10 mg into feeding tube daily.     [name] aspirin EC 81 MG EC tablet Take 1 tablet (81 mg total) by mouth daily at 6 (six) AM. (Patient not taking: Reported on 02/05/2020)    . Baclofen 5 MG TABS Take by mouth as needed.    . Nutritional Supplements (ISOSOURCE PO) Take by mouth 5 (five) times daily.    [name] scopolamine (TRANSDERM-SCOP) 1 MG/3DAYS Place 1 patch onto the skin every 3 (three) days as needed.    . senna (SENOKOT) 8.6 MG tablet Take 1 tablet by mouth 2 (two) times daily as needed for constipation.     No current facility-administered medications on file prior to visit.    There are no Patient Instructions on file for this visit. No follow-ups on file.   Kris Hartmann, NP  This note was completed with Sales executive.  Any errors are purely unintentional.

## 2020-05-15 ENCOUNTER — Other Ambulatory Visit: Payer: Self-pay

## 2020-05-15 ENCOUNTER — Ambulatory Visit (INDEPENDENT_AMBULATORY_CARE_PROVIDER_SITE_OTHER): Payer: BC Managed Care – PPO | Admitting: Vascular Surgery

## 2020-05-15 ENCOUNTER — Ambulatory Visit (INDEPENDENT_AMBULATORY_CARE_PROVIDER_SITE_OTHER): Payer: BC Managed Care – PPO

## 2020-05-15 ENCOUNTER — Encounter (INDEPENDENT_AMBULATORY_CARE_PROVIDER_SITE_OTHER): Payer: Self-pay | Admitting: Vascular Surgery

## 2020-05-15 VITALS — BP 139/68 | HR 57 | Ht 70.0 in | Wt 167.0 lb

## 2020-05-15 DIAGNOSIS — I714 Abdominal aortic aneurysm, without rupture, unspecified: Secondary | ICD-10-CM

## 2020-05-15 DIAGNOSIS — E785 Hyperlipidemia, unspecified: Secondary | ICD-10-CM | POA: Diagnosis not present

## 2020-05-15 DIAGNOSIS — E119 Type 2 diabetes mellitus without complications: Secondary | ICD-10-CM

## 2020-05-15 DIAGNOSIS — I10 Essential (primary) hypertension: Secondary | ICD-10-CM | POA: Diagnosis not present

## 2020-05-15 DIAGNOSIS — I6523 Occlusion and stenosis of bilateral carotid arteries: Secondary | ICD-10-CM

## 2020-05-15 NOTE — Progress Notes (Signed)
MRN : [id number]  Nathaniel Gamble is a 84 y.o. ([birth year]) male who presents with chief complaint of  Chief Complaint  Patient presents with  . Follow-up    U/S followup  .  History of Present Illness: Patient returns today in follow up of his abdominal aortic aneurysm.  He is doing well today without any aneurysm related symptoms. Specifically, the patient denies new back or abdominal pain, or signs of peripheral embolization. The patient is now almost 5 months status post endovascular abdominal aortic aneurysm repair with a stent graft.  His duplex today shows a marked decrease in the size of the aneurysm now measuring only 3.7 cm in maximal diameter.  Stent graft is patent with no evidence of endoleak.  Current Outpatient Medications  Medication Sig Dispense Refill  . acetaminophen (TYLENOL) 325 MG tablet Take 1-2 tablets (325-650 mg total) by mouth every 4 (four) hours as needed for mild pain (or temp >/= 101 F).    [name] amLODipine (NORVASC) 10 MG tablet Place 10 mg into feeding tube daily.     [name] apixaban (ELIQUIS) 5 MG TABS tablet Place 5 mg into feeding tube 2 (two) times daily.    [name] aspirin EC 81 MG EC tablet Take 1 tablet (81 mg total) by mouth daily at 6 (six) AM.    . Baclofen 5 MG TABS Take by mouth as needed.    . carvedilol (COREG) 25 MG tablet Place 25 mg into feeding tube 2 (two) times daily with a meal.     . hydrALAZINE (APRESOLINE) 10 MG tablet Take 10 mg by mouth Nightly.    [name] losartan (COZAAR) 100 MG tablet Take 100 mg by mouth daily.     . multivitamin-lutein (OCUVITE-LUTEIN) CAPS capsule Take 1 capsule by mouth daily.    . Nutritional Supplements (ISOSOURCE PO) Take by mouth 5 (five) times daily.    [name] scopolamine (TRANSDERM-SCOP) 1 MG/3DAYS Place 1 patch onto the skin every 3 (three) days as needed.    . senna (SENOKOT) 8.6 MG tablet Take 1 tablet by mouth 2 (two) times daily as needed for constipation.    . simvastatin (ZOCOR) 40 MG tablet Take 40 mg by mouth at  bedtime.    . Vitamin D, Ergocalciferol, (DRISDOL) 50000 units CAPS capsule Take 50,000 Units by mouth once a week.   1   No current facility-administered medications for this visit.    Past Medical History:  Diagnosis Date  . Abdominal aortic aneurysm ([place])    monitored by dr. Ubaldo Glassing  . Abdominal aortic aneurysm ([place])   . Cancer ([place]) 1976   melanoma, under right arm, removed  . Chronic kidney disease 2011   stent right kidney  . Coronary artery disease   . Diabetes mellitus without complication ([place])   . Hypertension   . Jaw fracture ([place])   . Myocardial infarction ([place])   . Peripheral vascular disease ([place])   . Renal artery stenosis ([place])   . Stroke [facility])     Past Surgical History:  Procedure Laterality Date  . ABDOMINAL AORTIC ENDOVASCULAR STENT GRAFT N/A 12/25/2019   Procedure: ABDOMINAL AORTIC ENDOVASCULAR STENT GRAFT;  Surgeon: Algernon Huxley, MD;  Location: [facility] ORS;  Service: Vascular;  Laterality: N/A;  . CATARACT EXTRACTION Bilateral 2014  . CORONARY ARTERY BYPASS GRAFT  2010  . ENDOVASCULAR REPAIR/STENT GRAFT N/A 12/25/2019   Procedure: ENDOVASCULAR REPAIR/STENT GRAFT;  Surgeon: Algernon Huxley, MD;  Location: [place] [facility];  Service: Cardiovascular;  Laterality: N/A;  . INGUINAL HERNIA REPAIR Left 07/09/2015   Procedure: HERNIA REPAIR INGUINAL ADULT;  Surgeon: Leonie Green, MD;  Location: [facility] ORS;  Service: General;  Laterality: Left;  . LOOP RECORDER INSERTION N/A 06/28/2018   Procedure: LOOP RECORDER INSERTION;  Surgeon: Isaias Cowman, MD;  Location: [place] [facility];  Service: Cardiovascular;  Laterality: N/A;  . MANDIBLE FRACTURE SURGERY Left   . RENAL ARTERY STENT Right      Social History   Tobacco Use  . Smoking status: Former Research scientist (life sciences)  . Smokeless tobacco: Former Network engineer Use Topics  . Alcohol use: Yes    Alcohol/week: 1.0 standard drink    Types: 1 Glasses of wine per week  . Drug use: No      Family History  Problem  Relation Age of Onset  . Hyperlipidemia Mother   . Stroke Mother   . Hyperlipidemia Father      Allergies  Allergen Reactions  . Oxycodone Nausea Only  . Lisinopril Swelling  . Tetanus Toxoids Swelling     REVIEW OF SYSTEMS (Negative unless checked)  Constitutional: [] Weight loss  [] Fever  [] Chills Cardiac: [] Chest pain   [] Chest pressure   [] Palpitations   [] Shortness of breath when laying flat   [] Shortness of breath at rest   [] Shortness of breath with exertion. Vascular:  [] Pain in legs with walking   [] Pain in legs at rest   [] Pain in legs when laying flat   [] Claudication   [] Pain in feet when walking  [] Pain in feet at rest  [] Pain in feet when laying flat   [] History of DVT   [] Phlebitis   [] Swelling in legs   [] Varicose veins   [] Non-healing ulcers Pulmonary:   [] Uses home oxygen   [] Productive cough   [] Hemoptysis   [] Wheeze  [] COPD   [] Asthma Neurologic:  [] Dizziness  [] Blackouts   [] Seizures   [x] History of stroke   [] History of TIA  [] Aphasia   [] Temporary blindness   [] Dysphagia   [] Weakness or numbness in arms   [] Weakness or numbness in legs Musculoskeletal:  [x] Arthritis   [] Joint swelling   [x] Joint pain   [] Low back pain Hematologic:  [] Easy bruising  [] Easy bleeding   [] Hypercoagulable state   [] Anemic   Gastrointestinal:  [] Blood in stool   [] Vomiting blood  [] Gastroesophageal reflux/heartburn   [] Abdominal pain Genitourinary:  [x] Chronic kidney disease   [] Difficult urination  [] Frequent urination  [] Burning with urination   [] Hematuria Skin:  [] Rashes   [] Ulcers   [] Wounds Psychological:  [] History of anxiety   []  History of major depression.  Physical Examination  BP 139/68   Pulse (!) 57   Ht 5\' 10"  (1.778 m)   Wt 167 lb (75.8 kg)   BMI 23.96 kg/m  Gen:  WD/WN, NAD. Appears younger than stated age. Head: [place]/AT, No temporalis wasting. Ear/Nose/Throat: Hearing grossly intact, nares w/o erythema or drainage Eyes: Conjunctiva clear. Sclera  non-icteric Neck: Supple.  Trachea midline Pulmonary:  Good air movement, no use of accessory muscles.  Cardiac: RRR, no JVD Vascular:  Vessel Right Left  Radial Palpable Palpable                                   Gastrointestinal: soft, non-tender/non-distended. No increased aortic impulse Musculoskeletal: M/S 5/5 throughout.  No deformity or atrophy. No edema. Neurologic: Sensation grossly intact in extremities.  Symmetrical.  Speech is fluent.  Psychiatric: Judgment intact, Mood & affect appropriate for pt's clinical situation. Dermatologic: No rashes or ulcers noted.  No cellulitis or open wounds.       Labs No results found for this or any previous visit (from the past 2160 hour(s)).  Radiology No results found.  Assessment/Plan Hypertension blood pressure control important in reducing the progression of atherosclerotic diseaseand AAA growth. On appropriate oral medications.   Dyslipidemia lipid control important in reducing the progression of atherosclerotic disease. Continue statin therapy  Diabetes mellitus without complication (HCC) blood glucose control important in reducing the progression of atherosclerotic disease. Also, involved in wound healing. On appropriate medications.  AAA (abdominal aortic aneurysm) without rupture [facility]) The patient is now almost 5 months status post endovascular abdominal aortic aneurysm repair with a stent graft.  His duplex today shows a marked decrease in the size of the aneurysm now measuring only 3.7 cm in maximal diameter.  Stent graft is patent with no evidence of endoleak.  At this point, we can stretch out his follow-up and see him in 6 months, and if it is stable then we can likely go to an annual visit.  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    Leotis Pain, MD  05/15/2020 9:46 AM    This note was created with  Dragon medical transcription system.  Any errors from dictation are purely unintentional

## 2020-05-15 NOTE — Assessment & Plan Note (Signed)
The patient is now almost 5 months status post endovascular abdominal aortic aneurysm repair with a stent graft.  His duplex today shows a marked decrease in the size of the aneurysm now measuring only 3.7 cm in maximal diameter.  Stent graft is patent with no evidence of endoleak.  At this point, we can stretch out his follow-up and see him in 6 months, and if it is stable then we can likely go to an annual visit.  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

## 2020-06-07 ENCOUNTER — Emergency Department: Payer: BC Managed Care – PPO

## 2020-06-07 ENCOUNTER — Other Ambulatory Visit: Payer: Self-pay

## 2020-06-07 ENCOUNTER — Encounter: Payer: Self-pay | Admitting: Emergency Medicine

## 2020-06-07 DIAGNOSIS — R29810 Facial weakness: Secondary | ICD-10-CM | POA: Diagnosis not present

## 2020-06-07 DIAGNOSIS — I10 Essential (primary) hypertension: Secondary | ICD-10-CM | POA: Insufficient documentation

## 2020-06-07 DIAGNOSIS — R200 Anesthesia of skin: Secondary | ICD-10-CM | POA: Diagnosis not present

## 2020-06-07 DIAGNOSIS — Z5321 Procedure and treatment not carried out due to patient leaving prior to being seen by health care provider: Secondary | ICD-10-CM | POA: Diagnosis not present

## 2020-06-07 LAB — COMPREHENSIVE METABOLIC PANEL
ALT: 16 U/L (ref 0–44)
AST: 17 U/L (ref 15–41)
Albumin: 4.2 g/dL (ref 3.5–5.0)
Alkaline Phosphatase: 73 U/L (ref 38–126)
Anion gap: 8 (ref 5–15)
BUN: 37 mg/dL — ABNORMAL HIGH (ref 8–23)
CO2: 27 mmol/L (ref 22–32)
Calcium: 9.5 mg/dL (ref 8.9–10.3)
Chloride: 105 mmol/L (ref 98–111)
Creatinine, Ser: 1.74 mg/dL — ABNORMAL HIGH (ref 0.61–1.24)
GFR calc Af Amer: 42 mL/min — ABNORMAL LOW (ref >60)
GFR calc non Af Amer: 36 mL/min — ABNORMAL LOW (ref >60)
Glucose, Bld: 101 mg/dL — ABNORMAL HIGH (ref 70–99)
Potassium: 4.5 mmol/L (ref 3.5–5.1)
Sodium: 140 mmol/L (ref 135–145)
Total Bilirubin: 0.9 mg/dL (ref 0.3–1.2)
Total Protein: 7 g/dL (ref 6.5–8.1)

## 2020-06-07 LAB — CBC
HCT: 40.9 % (ref 39.0–52.0)
Hemoglobin: 13.5 g/dL (ref 13.0–17.0)
MCH: 29.7 pg (ref 26.0–34.0)
MCHC: 33 g/dL (ref 30.0–36.0)
MCV: 90.1 fL (ref 80.0–100.0)
Platelets: 133 10*3/uL — ABNORMAL LOW (ref 150–400)
RBC: 4.54 MIL/uL (ref 4.22–5.81)
RDW: 14.8 % (ref 11.5–15.5)
WBC: 5.9 10*3/uL (ref 4.0–10.5)
nRBC: 0 % (ref 0.0–0.2)

## 2020-06-07 LAB — TROPONIN I (HIGH SENSITIVITY)
Troponin I (High Sensitivity): 6 ng/L (ref <18)
Troponin I (High Sensitivity): 6 ng/L (ref <18)

## 2020-06-07 NOTE — ED Triage Notes (Signed)
Pt and pt son reports pt had some left sided facial dropping starting at 10am. Sx's have resolved since then. Pt took 2 asa afterwards.

## 2020-06-07 NOTE — ED Triage Notes (Signed)
Patient states that about 10:00 this morning his wife noticed that he had a droop on the left side of his mouth. Patient states that he had some numbness and tingling at that time as well. Patient denies any numbness or tinging anywhere else. Patient states that the symptoms resolved about noon today. Patient states that he is here now because his blood pressure has been elevated. Patient states that his blood pressure was 169/75. Patient states that he has a history of hypertension but that it is normally well controlled with his blood pressure medications.

## 2020-06-08 ENCOUNTER — Emergency Department
Admission: EM | Admit: 2020-06-08 | Discharge: 2020-06-08 | Disposition: A | Discharge status: Home / Self Care | Payer: BC Managed Care – PPO | Attending: Emergency Medicine | Admitting: Emergency Medicine

## 2020-06-08 NOTE — ED Notes (Signed)
Pt let this writer know that he was going to contact his PCP, after Probation officer explained his wait time , and the process again.

## 2020-11-20 ENCOUNTER — Ambulatory Visit (INDEPENDENT_AMBULATORY_CARE_PROVIDER_SITE_OTHER): Payer: BC Managed Care – PPO

## 2020-11-20 ENCOUNTER — Ambulatory Visit (INDEPENDENT_AMBULATORY_CARE_PROVIDER_SITE_OTHER): Payer: BC Managed Care – PPO | Admitting: Vascular Surgery

## 2020-11-20 ENCOUNTER — Other Ambulatory Visit: Payer: Self-pay

## 2020-11-20 ENCOUNTER — Ambulatory Visit (INDEPENDENT_AMBULATORY_CARE_PROVIDER_SITE_OTHER): Payer: BC Managed Care – PPO | Admitting: Nurse Practitioner

## 2020-11-20 DIAGNOSIS — I714 Abdominal aortic aneurysm, without rupture, unspecified: Secondary | ICD-10-CM

## 2020-11-20 DIAGNOSIS — I6523 Occlusion and stenosis of bilateral carotid arteries: Secondary | ICD-10-CM | POA: Diagnosis not present

## 2020-11-27 ENCOUNTER — Encounter (INDEPENDENT_AMBULATORY_CARE_PROVIDER_SITE_OTHER): Payer: Self-pay | Admitting: *Deleted

## 2020-12-11 ENCOUNTER — Ambulatory Visit (INDEPENDENT_AMBULATORY_CARE_PROVIDER_SITE_OTHER): Payer: BC Managed Care – PPO | Admitting: Vascular Surgery

## 2020-12-11 ENCOUNTER — Encounter (INDEPENDENT_AMBULATORY_CARE_PROVIDER_SITE_OTHER): Payer: BC Managed Care – PPO

## 2021-01-31 ENCOUNTER — Emergency Department: Payer: BC Managed Care – PPO

## 2021-01-31 ENCOUNTER — Emergency Department
Admission: EM | Admit: 2021-01-31 | Discharge: 2021-01-31 | Disposition: A | Discharge status: Home / Self Care | Payer: BC Managed Care – PPO | Attending: Emergency Medicine | Admitting: Emergency Medicine

## 2021-01-31 ENCOUNTER — Other Ambulatory Visit: Payer: Self-pay

## 2021-01-31 DIAGNOSIS — Z85820 Personal history of malignant melanoma of skin: Secondary | ICD-10-CM | POA: Insufficient documentation

## 2021-01-31 DIAGNOSIS — N1832 Chronic kidney disease, stage 3b: Secondary | ICD-10-CM | POA: Diagnosis not present

## 2021-01-31 DIAGNOSIS — Z8673 Personal history of transient ischemic attack (TIA), and cerebral infarction without residual deficits: Secondary | ICD-10-CM | POA: Insufficient documentation

## 2021-01-31 DIAGNOSIS — H81399 Other peripheral vertigo, unspecified ear: Secondary | ICD-10-CM | POA: Insufficient documentation

## 2021-01-31 DIAGNOSIS — Z7982 Long term (current) use of aspirin: Secondary | ICD-10-CM | POA: Diagnosis not present

## 2021-01-31 DIAGNOSIS — Z79899 Other long term (current) drug therapy: Secondary | ICD-10-CM | POA: Insufficient documentation

## 2021-01-31 DIAGNOSIS — E1122 Type 2 diabetes mellitus with diabetic chronic kidney disease: Secondary | ICD-10-CM | POA: Insufficient documentation

## 2021-01-31 DIAGNOSIS — Z87891 Personal history of nicotine dependence: Secondary | ICD-10-CM | POA: Diagnosis not present

## 2021-01-31 DIAGNOSIS — R42 Dizziness and giddiness: Secondary | ICD-10-CM | POA: Diagnosis present

## 2021-01-31 DIAGNOSIS — I2510 Atherosclerotic heart disease of native coronary artery without angina pectoris: Secondary | ICD-10-CM | POA: Diagnosis not present

## 2021-01-31 DIAGNOSIS — I129 Hypertensive chronic kidney disease with stage 1 through stage 4 chronic kidney disease, or unspecified chronic kidney disease: Secondary | ICD-10-CM | POA: Insufficient documentation

## 2021-01-31 LAB — COMPREHENSIVE METABOLIC PANEL
ALT: 18 U/L (ref 0–44)
AST: 19 U/L (ref 15–41)
Albumin: 4.2 g/dL (ref 3.5–5.0)
Alkaline Phosphatase: 68 U/L (ref 38–126)
Anion gap: 7 (ref 5–15)
BUN: 38 mg/dL — ABNORMAL HIGH (ref 8–23)
CO2: 24 mmol/L (ref 22–32)
Calcium: 9.2 mg/dL (ref 8.9–10.3)
Chloride: 105 mmol/L (ref 98–111)
Creatinine, Ser: 1.65 mg/dL — ABNORMAL HIGH (ref 0.61–1.24)
GFR, Estimated: 41 mL/min — ABNORMAL LOW (ref >60)
Glucose, Bld: 144 mg/dL — ABNORMAL HIGH (ref 70–99)
Potassium: 5 mmol/L (ref 3.5–5.1)
Sodium: 136 mmol/L (ref 135–145)
Total Bilirubin: 1.2 mg/dL (ref 0.3–1.2)
Total Protein: 7.2 g/dL (ref 6.5–8.1)

## 2021-01-31 LAB — CBC
HCT: 41.4 % (ref 39.0–52.0)
Hemoglobin: 13.7 g/dL (ref 13.0–17.0)
MCH: 30.2 pg (ref 26.0–34.0)
MCHC: 33.1 g/dL (ref 30.0–36.0)
MCV: 91.4 fL (ref 80.0–100.0)
Platelets: 138 10*3/uL — ABNORMAL LOW (ref 150–400)
RBC: 4.53 MIL/uL (ref 4.22–5.81)
RDW: 14.5 % (ref 11.5–15.5)
WBC: 7.8 10*3/uL (ref 4.0–10.5)
nRBC: 0 % (ref 0.0–0.2)

## 2021-01-31 LAB — LIPASE, BLOOD: Lipase: 36 U/L (ref 11–51)

## 2021-01-31 MED ORDER — ONDANSETRON 4 MG PO TBDP: 4.0000 mg | ORAL_TABLET | Freq: Four times a day (QID) | ORAL | 0 refills | Status: DC | PRN

## 2021-01-31 MED ORDER — MECLIZINE HCL 25 MG PO TABS
12.5000 mg | ORAL_TABLET | Freq: Once | ORAL | Status: AC
  Administered 2021-01-31: 12.5 mg via ORAL
  Filled 2021-01-31: qty 1

## 2021-01-31 MED ORDER — MECLIZINE HCL 12.5 MG PO TABS: 12.5000 mg | ORAL_TABLET | Freq: Three times a day (TID) | ORAL | 0 refills | Status: DC | PRN

## 2021-01-31 MED ORDER — ONDANSETRON 4 MG PO TBDP
4.0000 mg | ORAL_TABLET | Freq: Once | ORAL | Status: AC | PRN
  Administered 2021-01-31: 4 mg via ORAL
  Filled 2021-01-31: qty 1

## 2021-01-31 NOTE — Discharge Instructions (Addendum)
Please continue your current medications.  Be cautious and I recommend against driving at least for the next few days until your symptoms have improved.  Please be careful when up and moving about, especially as vertigo can cause you to be at risk for falling.  As you take Eliquis, should you fall experience an injury where you strike your head it is very important you come to the ER for further work-up after any head injury.

## 2021-01-31 NOTE — ED Notes (Signed)
Md at bedside and patient advised he has some sort of implanted heart device. Will consult with MRI.

## 2021-01-31 NOTE — ED Triage Notes (Signed)
Pt states he was eating lunch and started to feel sick- pt states he was too weak to get up from the table- pt states he was light headed and nauseous- pt vomited in triage room- pt states this is the second time today

## 2021-01-31 NOTE — ED Notes (Signed)
Pt dressed and discharge paperwork went over with patient as well as follow up care review. Pt taken to lobby in wheelchair to await his son, Nathaniel Gamble to pull up to front of ED.
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
## 2021-01-31 NOTE — ED Notes (Signed)
Patient transported to MRI 

## 2021-02-01 IMAGING — MR MR HEAD W/O CM
10 of 12 series · 37 of 48 positions shown · non-contrast
Comparison: CT head without contrast 07/20/2019

CLINICAL DATA: Stroke, follow-up. Slurred speech and difficulty
swallowing beginning at [DATE] a.m. today.

EXAM:
MRI HEAD WITHOUT CONTRAST
TECHNIQUE: Multiplanar, multiecho pulse sequences of the brain and surrounding
structures were obtained without intravenous contrast.

[Series 5: ax dwi_tracew · axial · 3.0mm · 0.60mm/px · z∈[-46,+107]mm · 4 of 48 slices shown]
[im 1/48]
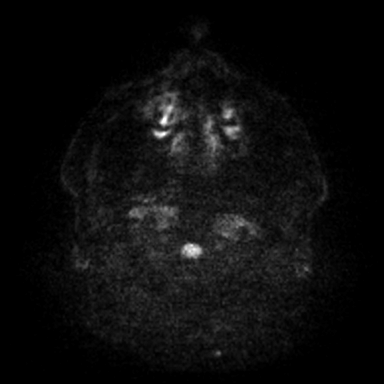
[im 16/48]
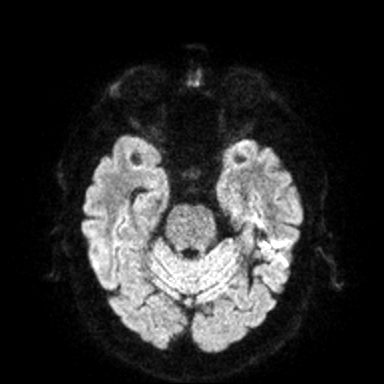
[im 32/48]
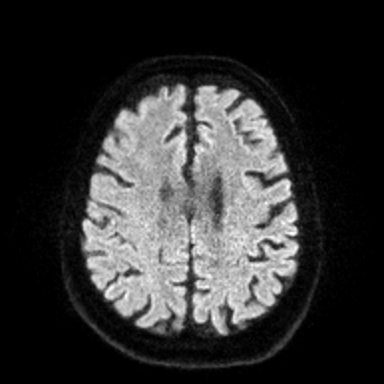
[im 48/48]
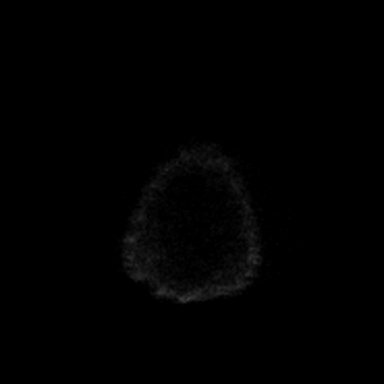

[Series 6: ax dwi_adc · axial · 3.0mm · 0.60mm/px · z∈[-46,+107]mm · 4 of 48 slices shown]
[im 1/48]
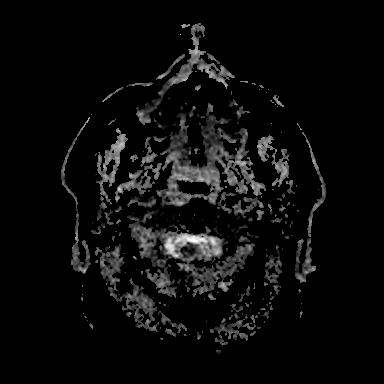
[im 16/48]
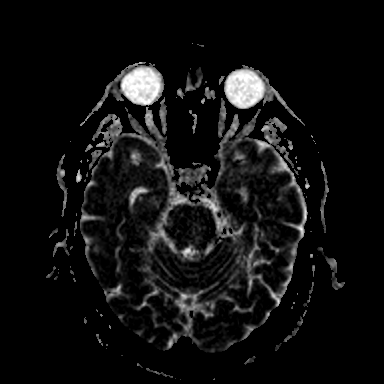
[im 32/48]
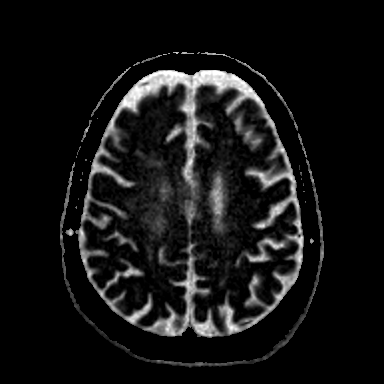
[im 48/48]
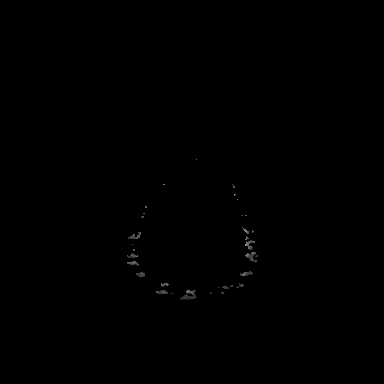

[Series 7: cor dwi_tracew · coronal · 5.0mm · 0.60mm/px · 4 of 38 slices shown]
[im 1/38]
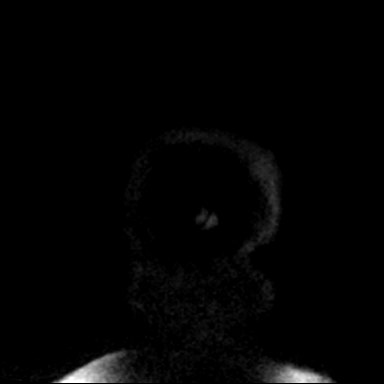
[im 13/38]
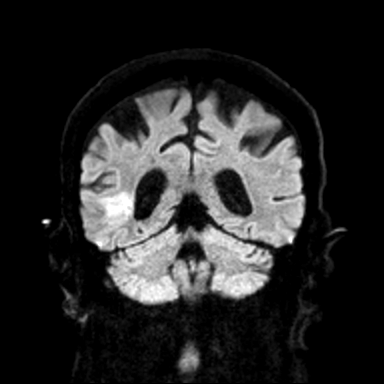
[im 25/38]
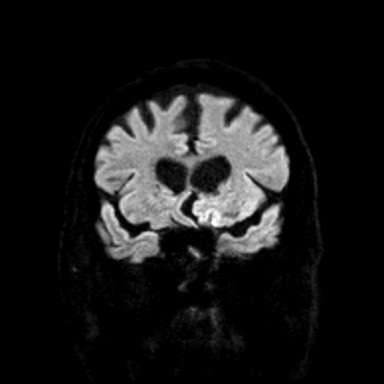
[im 38/38]
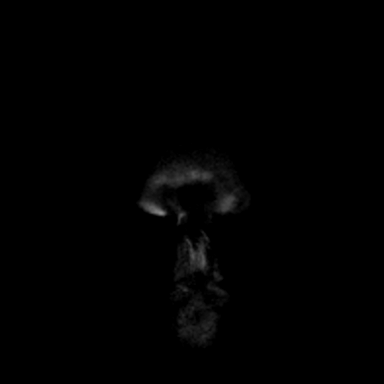

[Series 8: cor dwi_adc · coronal · 5.0mm · 0.60mm/px · 3 of 37 slices shown]
[im 1/37]
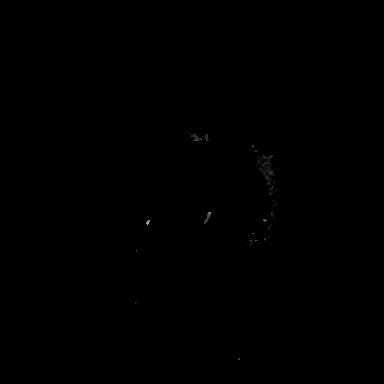
[im 19/37]
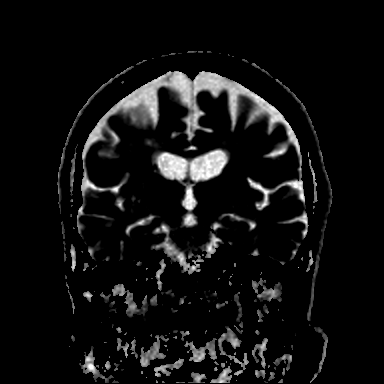
[im 37/37]
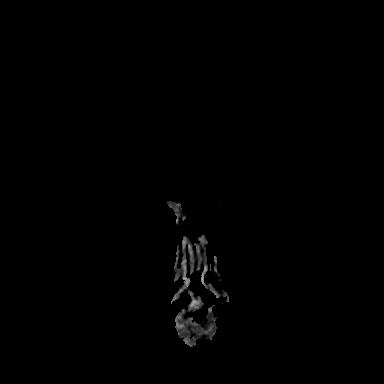

[Series 9: T1 · sagittal · 5.0mm · 0.62mm/px · 2 of 25 slices shown (1 of 2)]
[im 1/25]
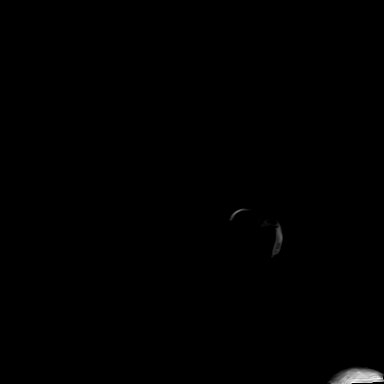
[im 25/25]
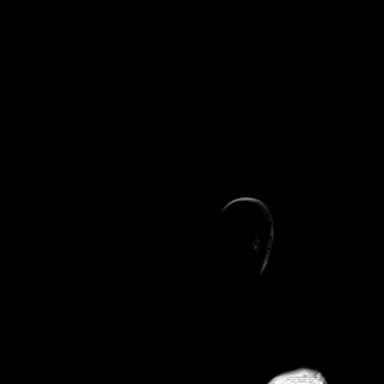

[Series 10: T2 · axial · 5.0mm · 0.53mm/px · z∈[-51,+102]mm · 3 of 27 slices shown (1 of 2)]
[im 1/27]
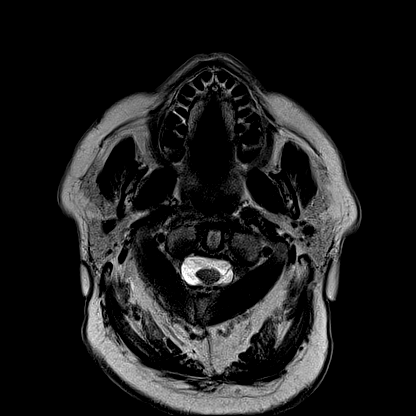
[im 14/27]
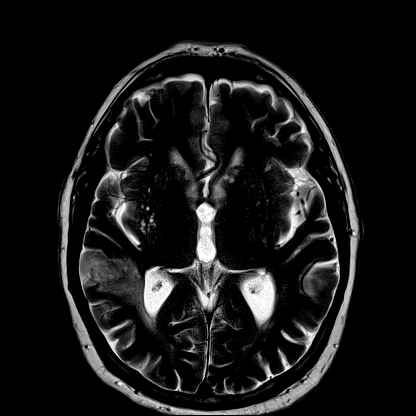
[im 27/27]
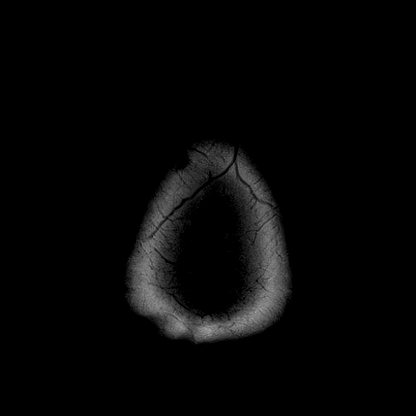

[Series 11: mag_images · axial · 3.0mm · 0.90mm/px · z∈[-60,+114]mm · 6 of 60 slices shown]
[im 1/60]
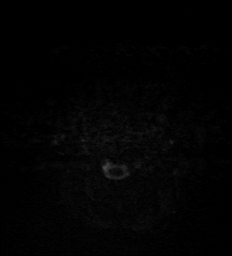
[im 12/60]
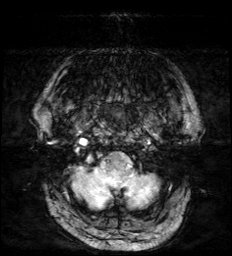
[im 24/60]
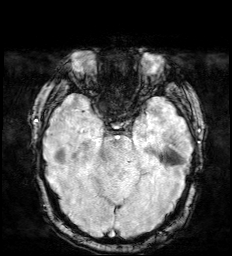
[im 36/60]
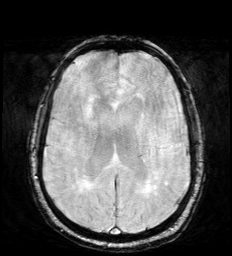
[im 48/60]
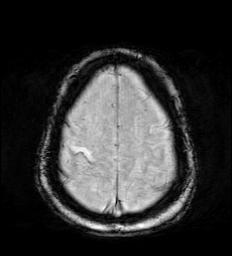
[im 60/60]
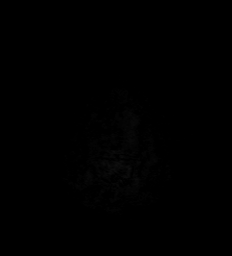

[Series 15: FLAIR · axial · 3.0mm · 0.53mm/px · z∈[-54,+105]mm · 5 of 55 slices shown]
[im 1/55]
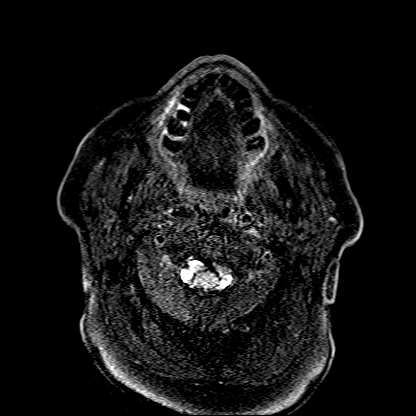
[im 14/55]
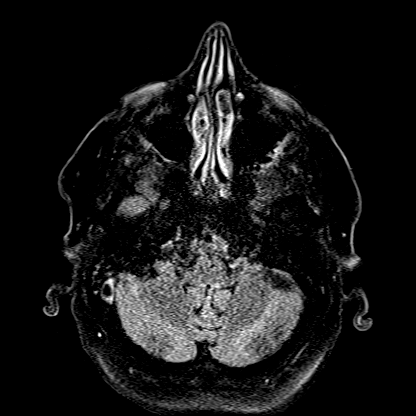
[im 28/55]
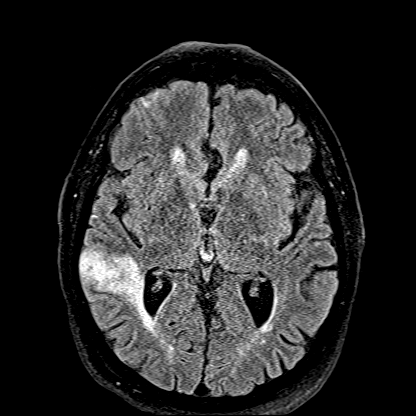
[im 41/55]
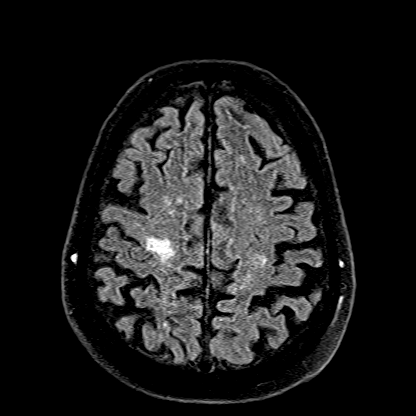
[im 55/55]
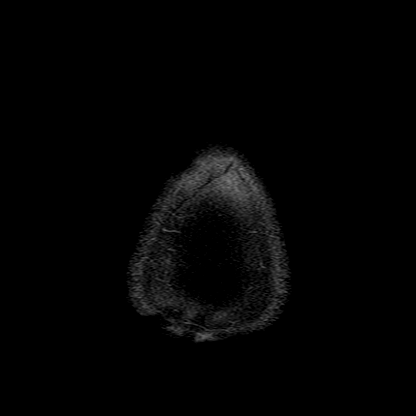

[Series 17: T2 · coronal · 5.0mm · 0.57mm/px · 3 of 29 slices shown (2 of 2)]
[im 1/29]
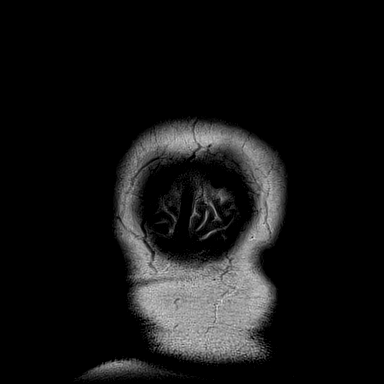
[im 15/29]
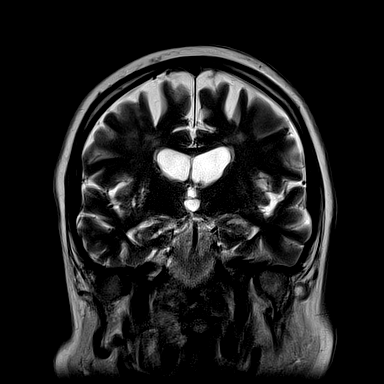
[im 29/29]
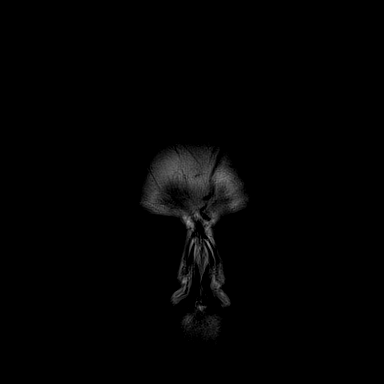

[Series 18: T1 · axial · 5.0mm · 0.90mm/px · z∈[-50,+103]mm · 3 of 27 slices shown (2 of 2)]
[im 1/27]
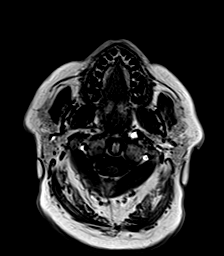
[im 14/27]
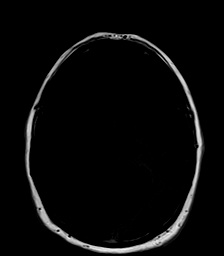
[im 27/27]
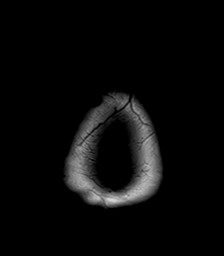

[37 of 48 positions shown; findings below may reference images not displayed]

FINDINGS: Brain: 2 punctate foci of restricted diffusion are present in the
right frontal lobe in the pre frontal area. There is a more diffuse
focus of T2 hyperintensity involving the posterior right temporal
lobe and inferior right parietal lobe as noted on CT. This results
in T2 shine through on the diffusion-weighted images. No restricted
diffusion is present in this region.

Extensive white matter disease extending into the corona radiata
bilaterally is worse right than left. Remote lacunar infarcts are
present in the caudate head bilaterally. Remote lacunar infarcts are
present in the right lentiform nucleus. The brainstem and cerebellum
are within normal limits. Insert normal IAC's

Vascular: Flow is present in the major intracranial arteries.

Skull and upper cervical spine: The craniocervical junction is
normal. Upper cervical spine is within normal limits. Marrow signal
is unremarkable.

Sinuses/Orbits: Posterior right sphenoid sinus opacification is
present. There may be a central polyp as well. There is some fluid
in the mastoid air cells bilaterally, right greater than left. The
paranasal sinuses and mastoid air cells are otherwise clear.
Bilateral lens replacements are noted. Globes and orbits are
otherwise unremarkable.
IMPRESSION: 1. Two acute punctate nonhemorrhagic cortical infarcts of the right
frontal lobe, anterior to the primary motor cortex.
2. Area of T2 signal change involving the posterior right temporal
lobe and inferior right parietal lobe consistent with late subacute
or early chronic infarct. There is no acute infarcts in this region.
Question ischemic event within the last 2-3 months?
3. Stable remote lacunar infarcts of the basal ganglia bilaterally,
right greater than left.
4. Chronic right sphenoid sinus disease.

## 2021-05-26 ENCOUNTER — Encounter: Payer: Self-pay | Admitting: Podiatry

## 2021-05-26 ENCOUNTER — Other Ambulatory Visit: Payer: Self-pay

## 2021-05-26 ENCOUNTER — Ambulatory Visit (INDEPENDENT_AMBULATORY_CARE_PROVIDER_SITE_OTHER): Payer: BC Managed Care – PPO | Admitting: Podiatry

## 2021-05-26 DIAGNOSIS — Z7901 Long term (current) use of anticoagulants: Secondary | ICD-10-CM

## 2021-05-26 DIAGNOSIS — L84 Corns and callosities: Secondary | ICD-10-CM | POA: Diagnosis not present

## 2021-05-26 DIAGNOSIS — R52 Pain, unspecified: Secondary | ICD-10-CM | POA: Diagnosis not present

## 2021-05-26 DIAGNOSIS — M2042 Other hammer toe(s) (acquired), left foot: Secondary | ICD-10-CM

## 2021-05-26 DIAGNOSIS — M216X1 Other acquired deformities of right foot: Secondary | ICD-10-CM | POA: Diagnosis not present

## 2021-05-26 DIAGNOSIS — M7741 Metatarsalgia, right foot: Secondary | ICD-10-CM

## 2021-05-26 DIAGNOSIS — M216X2 Other acquired deformities of left foot: Secondary | ICD-10-CM | POA: Diagnosis not present

## 2021-05-26 DIAGNOSIS — M7742 Metatarsalgia, left foot: Secondary | ICD-10-CM | POA: Diagnosis not present

## 2021-05-26 NOTE — Progress Notes (Signed)
  Subjective:  Patient ID: Nathaniel Gamble, male    DOB: [birth year],  MRN: [id number]  Chief Complaint  Patient presents with   Foot Orthotics     (np) bilat foot pain, intrested in orthotics    85 y.o. male presents with the above complaint. History confirmed with patient.  Has a painful callus on the left third toe as well that is often relieved with pedicures.  Has pain and swelling in both feet he still works.  Objective:  Physical Exam: warm, good capillary refill, no trophic changes or ulcerative lesions, normal DP and PT pulses, and normal sensory exam.  Pes cavus foot type bilateral Left Foot: Semirigid hammertoe deformity with preulcerative callus on the tip of the third toe, he has very thin metatarsal fat pad with mild tenderness and swelling here Right Foot: Very thin metatarsal fat pad with mild tenderness and swelling here  Assessment:   1. Hammertoe of left foot   2. Callus of foot   3. Pain   4. Acquired bilateral pes cavus   5. Metatarsalgia of both feet      Plan:  Patient was evaluated and treated and all questions answered.  All symptomatic hyperkeratoses were safely debrided with a sterile #15 blade to patient's level of comfort without incident. We discussed preventative and palliative care of these lesions including supportive and accommodative shoegear, padding, prefabricated and custom molded accommodative orthoses, use of a pumice stone and lotions/creams daily.  Applied iodine ointment to this today.  He will apply Neosporin as needed.  Silicone toe crests and offloading pads dispensed to alleviate pressure on this.  Also discussed treatment of metatarsalgia and foot pain with custom molded orthoses.  He was casted for these today and will be created so that he may stay active and continue working.  Return for for orthotics pick up when they are ready .

## 2021-06-16 ENCOUNTER — Other Ambulatory Visit: Payer: Self-pay

## 2021-06-16 ENCOUNTER — Ambulatory Visit (INDEPENDENT_AMBULATORY_CARE_PROVIDER_SITE_OTHER): Payer: BC Managed Care – PPO | Admitting: Podiatry

## 2021-06-16 DIAGNOSIS — M216X1 Other acquired deformities of right foot: Secondary | ICD-10-CM

## 2021-06-16 DIAGNOSIS — M7741 Metatarsalgia, right foot: Secondary | ICD-10-CM | POA: Diagnosis not present

## 2021-06-16 DIAGNOSIS — L84 Corns and callosities: Secondary | ICD-10-CM | POA: Diagnosis not present

## 2021-06-16 DIAGNOSIS — M2042 Other hammer toe(s) (acquired), left foot: Secondary | ICD-10-CM | POA: Diagnosis not present

## 2021-06-16 DIAGNOSIS — R52 Pain, unspecified: Secondary | ICD-10-CM

## 2021-06-16 DIAGNOSIS — M7742 Metatarsalgia, left foot: Secondary | ICD-10-CM

## 2021-06-16 DIAGNOSIS — M216X2 Other acquired deformities of left foot: Secondary | ICD-10-CM | POA: Diagnosis not present

## 2021-06-16 NOTE — Progress Notes (Signed)
  Subjective:  Patient ID: Nathaniel Gamble, male    DOB: [birth year],  MRN: [id number]  Chief Complaint  Patient presents with   Hammer Toe      toe red from where corn was cut off/puo(orthotics in [place] to be taken to [place]..[id number])    85 y.o. male returns for follow-up with the above complaint. History confirmed with patient.  Calluses quite painful again  Objective:  Physical Exam: warm, good capillary refill, no trophic changes or ulcerative lesions, normal DP and PT pulses, and normal sensory exam.  Pes cavus foot type bilateral Left Foot: Semirigid hammertoe deformity of all toes with preulcerative callus on the tip of the third toe, he has very thin metatarsal fat pad with mild tenderness and swelling here Right Foot: Very thin metatarsal fat pad with mild tenderness and swelling here  Assessment:   1. Callus of foot   2. Pain   3. Hammertoe of left foot      Plan:  Patient was evaluated and treated and all questions answered.  All symptomatic hyperkeratoses were safely debrided with a sterile #15 blade to patient's level of comfort without incident. We discussed preventative and palliative care of these lesions including supportive and accommodative shoegear, padding, prefabricated and custom molded accommodative orthoses, use of a pumice stone and lotions/creams daily.  Applied iodine ointment to this today.  He will apply Neosporin as needed.  Silicone toe crests and toe caps were again dispensed to alleviate pressure on this.  Discussed with him if this continues to worsen or does not improve may need to consider hammertoe correction  His orthotics were dispensed today and assessed for form fit and function. He will let me know if there are any issues we need to adjust  Return if symptoms worsen or fail to improve.

## 2021-11-17 ENCOUNTER — Other Ambulatory Visit (INDEPENDENT_AMBULATORY_CARE_PROVIDER_SITE_OTHER): Payer: Self-pay | Admitting: Vascular Surgery

## 2021-11-17 DIAGNOSIS — I739 Peripheral vascular disease, unspecified: Secondary | ICD-10-CM

## 2021-11-17 DIAGNOSIS — I714 Abdominal aortic aneurysm, without rupture, unspecified: Secondary | ICD-10-CM

## 2021-11-19 ENCOUNTER — Other Ambulatory Visit: Payer: Self-pay

## 2021-11-19 ENCOUNTER — Other Ambulatory Visit (INDEPENDENT_AMBULATORY_CARE_PROVIDER_SITE_OTHER): Payer: BC Managed Care – PPO

## 2021-11-19 ENCOUNTER — Ambulatory Visit (INDEPENDENT_AMBULATORY_CARE_PROVIDER_SITE_OTHER): Payer: BC Managed Care – PPO | Admitting: Vascular Surgery

## 2021-11-19 ENCOUNTER — Ambulatory Visit (INDEPENDENT_AMBULATORY_CARE_PROVIDER_SITE_OTHER): Payer: BC Managed Care – PPO

## 2021-11-19 ENCOUNTER — Encounter (INDEPENDENT_AMBULATORY_CARE_PROVIDER_SITE_OTHER): Payer: Self-pay | Admitting: Vascular Surgery

## 2021-11-19 VITALS — BP 175/69 | HR 52 | Ht 70.0 in | Wt 182.0 lb

## 2021-11-19 DIAGNOSIS — I6523 Occlusion and stenosis of bilateral carotid arteries: Secondary | ICD-10-CM

## 2021-11-19 DIAGNOSIS — I739 Peripheral vascular disease, unspecified: Secondary | ICD-10-CM

## 2021-11-19 DIAGNOSIS — E785 Hyperlipidemia, unspecified: Secondary | ICD-10-CM

## 2021-11-19 DIAGNOSIS — M7989 Other specified soft tissue disorders: Secondary | ICD-10-CM

## 2021-11-19 DIAGNOSIS — I7143 Infrarenal abdominal aortic aneurysm, without rupture: Secondary | ICD-10-CM | POA: Diagnosis not present

## 2021-11-19 DIAGNOSIS — E119 Type 2 diabetes mellitus without complications: Secondary | ICD-10-CM

## 2021-11-19 DIAGNOSIS — I10 Essential (primary) hypertension: Secondary | ICD-10-CM | POA: Diagnosis not present

## 2021-11-19 DIAGNOSIS — I714 Abdominal aortic aneurysm, without rupture, unspecified: Secondary | ICD-10-CM

## 2021-11-19 NOTE — Assessment & Plan Note (Signed)
The patient has likely developed significant lymphedema from chronic scarring and lymphatic channels.  The right leg is now being affected some as well.  We recommended he get compression stockings and begin wearing them daily.  He is elevating his legs.  He will also resume as much activity as tolerated.  If his symptoms do not improve, we may consider a lymphedema pump at his next visit.

## 2021-11-19 NOTE — Progress Notes (Signed)
MRN : [id number]  Nathaniel Gamble is a 86 y.o. (1936/06/19) male who presents with chief complaint of No chief complaint on file. [name]  History of Present Illness: Patient returns in follow up of multiple vascular issues.  He is doing well today.  He really does not have any specific complaints.  He has some mild claudication symptoms with activity but nothing that is disabling.  No rest pain or ulceration. ABIs today are a little better at 0.81 on the right and 0.91 on the left with digit pressures in the 90s on each side. He is also 2 years status post endovascular abdominal aortic aneurysm repair.  He is doing well without any aneurysm related symptoms.  His duplex today really shows complete shrinkage of the aneurysm sac around the stent graft with no significant residual aneurysm and no endoleak.  The stent graft is patent. His biggest complaint today is really of leg swelling.  He has began to notice increasing leg swelling in the right leg as well as the chronic leg swelling he has in his left leg.  He is not wearing compression socks but he does try to elevate his legs much as possible.  The left leg stay swollen all the time with the right leg being intermittently swollen.  No open wounds or weeping.  No fevers or chills.  Current Outpatient Medications  Medication Sig Dispense Refill   acetaminophen (TYLENOL) 325 MG tablet Take 1-2 tablets (325-650 mg total) by mouth every 4 (four) hours as needed for mild pain (or temp >/= 101 F).     amLODipine (NORVASC) 10 MG tablet Take 1 tablet by mouth daily.     apixaban (ELIQUIS) 5 MG TABS tablet Place 5 mg into feeding tube 2 (two) times daily.     aspirin EC 81 MG EC tablet Take 1 tablet (81 mg total) by mouth daily at 6 (six) AM.     Azelastine HCl 137 MCG/SPRAY SOLN Place into both nostrils.     Baclofen 5 MG TABS Take by mouth as needed.     carvedilol (COREG) 25 MG tablet Place 25 mg into feeding tube 2 (two) times daily with a meal.       hydrALAZINE (APRESOLINE) 10 MG tablet Take 1 tablet by mouth at bedtime.     losartan (COZAAR) 100 MG tablet Take 100 mg by mouth daily.      meclizine (ANTIVERT) 12.5 MG tablet Take 1 tablet (12.5 mg total) by mouth 3 (three) times daily as needed for dizziness or nausea. 30 tablet 0   multivitamin-lutein (OCUVITE-LUTEIN) CAPS capsule Take 1 capsule by mouth daily.     Nutritional Supplements (ISOSOURCE PO) Take by mouth 5 (five) times daily.     ondansetron (ZOFRAN ODT) 4 MG disintegrating tablet Take 1 tablet (4 mg total) by mouth every 6 (six) hours as needed for nausea or vomiting. 20 tablet 0   senna (SENOKOT) 8.6 MG tablet Take 1 tablet by mouth 2 (two) times daily as needed for constipation.     simvastatin (ZOCOR) 40 MG tablet Take by mouth.     Vitamin D, Ergocalciferol, (DRISDOL) 50000 units CAPS capsule Take 50,000 Units by mouth once a week.   1   hydrALAZINE (APRESOLINE) 10 MG tablet Take 10 mg by mouth Nightly.     scopolamine (TRANSDERM-SCOP) 1 MG/3DAYS Place 1 patch onto the skin every 3 (three) days as needed.     No current facility-administered medications for this visit.  Past Medical History:  Diagnosis Date   Abdominal aortic aneurysm    monitored by dr. Ubaldo Glassing   Abdominal aortic aneurysm    Cancer ([place]) 1976   melanoma, under right arm, removed   Chronic kidney disease 2011   stent right kidney   Coronary artery disease    Diabetes mellitus without complication ([place])    Hypertension    Jaw fracture ([place])    Myocardial infarction [facility])    Peripheral vascular disease ([place])    Renal artery stenosis ([place])    Stroke [facility])     Past Surgical History:  Procedure Laterality Date   ABDOMINAL AORTIC ENDOVASCULAR STENT GRAFT N/A 12/25/2019   Procedure: ABDOMINAL AORTIC ENDOVASCULAR STENT GRAFT;  Surgeon: Algernon Huxley, MD;  Location: [facility] ORS;  Service: Vascular;  Laterality: N/A;   CATARACT EXTRACTION Bilateral 2014   CORONARY ARTERY BYPASS GRAFT  2010    ENDOVASCULAR REPAIR/STENT GRAFT N/A 12/25/2019   Procedure: ENDOVASCULAR REPAIR/STENT GRAFT;  Surgeon: Algernon Huxley, MD;  Location: [place] [facility];  Service: Cardiovascular;  Laterality: N/A;   INGUINAL HERNIA REPAIR Left 07/09/2015   Procedure: HERNIA REPAIR INGUINAL ADULT;  Surgeon: Leonie Green, MD;  Location: [facility] ORS;  Service: General;  Laterality: Left;   LOOP RECORDER INSERTION N/A 06/28/2018   Procedure: LOOP RECORDER INSERTION;  Surgeon: Isaias Cowman, MD;  Location: [place] [facility];  Service: Cardiovascular;  Laterality: N/A;   MANDIBLE FRACTURE SURGERY Left    RENAL ARTERY STENT Right      Social History   Tobacco Use   Smoking status: Former   Smokeless tobacco: Former  Substance Use Topics   Alcohol use: Yes    Alcohol/week: 1.0 standard drink    Types: 1 Glasses of wine per week   Drug use: No     Family History  Problem Relation Age of Onset   Hyperlipidemia Mother    Stroke Mother    Hyperlipidemia Father     Allergies  Allergen Reactions   Oxycodone Nausea Only   Lisinopril Swelling   Tetanus Toxoids Swelling    REVIEW OF SYSTEMS (Negative unless checked)   Constitutional: [] Weight loss  [] Fever  [] Chills Cardiac: [] Chest pain   [] Chest pressure   [] Palpitations   [] Shortness of breath when laying flat   [] Shortness of breath at rest   [] Shortness of breath with exertion. Vascular:  [] Pain in legs with walking   [] Pain in legs at rest   [] Pain in legs when laying flat   [] Claudication   [] Pain in feet when walking  [] Pain in feet at rest  [] Pain in feet when laying flat   [] History of DVT   [] Phlebitis   [] Swelling in legs   [] Varicose veins   [] Non-healing ulcers Pulmonary:   [] Uses home oxygen   [] Productive cough   [] Hemoptysis   [] Wheeze  [] COPD   [] Asthma Neurologic:  [] Dizziness  [] Blackouts   [] Seizures   [x] History of stroke   [] History of TIA  [] Aphasia   [] Temporary blindness   [] Dysphagia   [] Weakness or numbness in arms    [] Weakness or numbness in legs Musculoskeletal:  [x] Arthritis   [] Joint swelling   [x] Joint pain   [] Low back pain Hematologic:  [] Easy bruising  [] Easy bleeding   [] Hypercoagulable state   [] Anemic   Gastrointestinal:  [] Blood in stool   [] Vomiting blood  [] Gastroesophageal reflux/heartburn   [] Abdominal pain Genitourinary:  [x] Chronic kidney disease   [] Difficult urination  [] Frequent urination  [] Burning with  urination   [] Hematuria Skin:  [] Rashes   [] Ulcers   [] Wounds Psychological:  [] History of anxiety   []  History of major depression.   Physical Examination  Vitals:   11/19/21 0910  BP: (!) 175/69  Pulse: (!) 52  Weight: 182 lb (82.6 kg)  Height: 5\' 10"  (1.778 m)   Body mass index is 26.11 kg/m. Gen:  WD/WN, NAD Head: [place]/AT, No temporalis wasting. Ear/Nose/Throat: Hearing grossly intact, nares w/o erythema or drainage, trachea midline Eyes: Conjunctiva clear. Sclera non-icteric Neck: Supple.  Trachea midline Pulmonary:  Good air movement, equal and clear to auscultation bilaterally.  Cardiac: RRR, No JVD Vascular:  Vessel Right Left  Radial Palpable Palpable  PT 1+ NP  DP 1+ 1+   Musculoskeletal: M/S 5/5 throughout.  No deformity or atrophy.  Trace right lower extremity edema, 1-2+ left lower extremity edema. Neurologic: CN 2-12 intact. Sensation grossly intact in extremities.  Symmetrical.  Speech is fluent. Motor exam as listed above. Psychiatric: Judgment intact, Mood & affect appropriate for pt's clinical situation. Dermatologic: No rashes or ulcers noted.  No cellulitis or open wounds.    CBC Lab Results  Component Value Date   WBC 7.8 01/31/2021   HGB 13.7 01/31/2021   HCT 41.4 01/31/2021   MCV 91.4 01/31/2021   PLT 138 (L) 01/31/2021    BMET    Component Value Date/Time   NA 136 01/31/2021 1656   K 5.0 01/31/2021 1656   CL 105 01/31/2021 1656   CO2 24 01/31/2021 1656   GLUCOSE 144 (H) 01/31/2021 1656   BUN 38 (H) 01/31/2021 1656   CREATININE  1.65 (H) 01/31/2021 1656   CALCIUM 9.2 01/31/2021 1656   GFRNONAA 41 (L) 01/31/2021 1656   GFRAA 42 (L) 06/07/2020 1929   CrCl cannot be calculated (Patient's most recent lab result is older than the maximum 21 days allowed.).  COAG Lab Results  Component Value Date   INR 1.4 (H) 12/23/2019   INR 1.4 (H) 07/20/2019   INR 1.05 05/10/2018    Radiology No results found.   Assessment/Plan Hypertension blood pressure control important in reducing the progression of atherosclerotic disease and AAA growth. On appropriate oral medications.     Dyslipidemia lipid control important in reducing the progression of atherosclerotic disease. Continue statin therapy   Diabetes mellitus without complication (HCC) blood glucose control important in reducing the progression of atherosclerotic disease. Also, involved in wound healing. On appropriate medications.  Peripheral arterial disease (HCC) ABIs today are a little better at 0.81 on the right and 0.91 on the left with digit pressures in the 90s on each side.  No disabling claudication or critical limb threatening symptoms at this time.  Recheck in 1 year.  AAA (abdominal aortic aneurysm) without rupture (HCC) His duplex today really shows complete shrinkage of the aneurysm sac around the stent graft with no significant residual aneurysm and no endoleak.  The stent graft is patent.  Doing well.  Continue to follow annually.  Swelling of limb The patient has likely developed significant lymphedema from chronic scarring and lymphatic channels.  The right leg is now being affected some as well.  We recommended he get compression stockings and begin wearing them daily.  He is elevating his legs.  He will also resume as much activity as tolerated.  If his symptoms do not improve, we may consider a lymphedema pump at his next visit.    Leotis Pain, MD  11/19/2021 10:54 AM    This note  was created with Dragon medical transcription system.  Any  errors from dictation are purely unintentional

## 2021-11-19 NOTE — Assessment & Plan Note (Signed)
His duplex today really shows complete shrinkage of the aneurysm sac around the stent graft with no significant residual aneurysm and no endoleak.  The stent graft is patent.  Doing well.  Continue to follow annually.

## 2021-11-19 NOTE — Assessment & Plan Note (Signed)
ABIs today are a little better at 0.81 on the right and 0.91 on the left with digit pressures in the 90s on each side.  No disabling claudication or critical limb threatening symptoms at this time.  Recheck in 1 year.

## 2022-09-12 ENCOUNTER — Encounter (INDEPENDENT_AMBULATORY_CARE_PROVIDER_SITE_OTHER): Payer: Self-pay

## 2022-10-24 ENCOUNTER — Ambulatory Visit (LOCAL_COMMUNITY_HEALTH_CENTER): Payer: BC Managed Care – PPO

## 2022-10-24 DIAGNOSIS — Z23 Encounter for immunization: Secondary | ICD-10-CM | POA: Diagnosis not present

## 2022-10-24 DIAGNOSIS — Z719 Counseling, unspecified: Secondary | ICD-10-CM

## 2022-10-24 NOTE — Progress Notes (Signed)
  Are you feeling sick today? No   Have you ever received a dose of COVID-19 Vaccine? [employer], [place], [place], [place], Other) Yes  If yes, which vaccine and how many doses?   5 doses Pfizer   Did you bring the vaccination record card or other documentation?  Yes   Do you have a health condition or are undergoing treatment that makes you moderately or severely immunocompromised? This would include, but not be limited to: cancer, HIV, organ transplant, immunosuppressive therapy/high-dose corticosteroids, or moderate/severe primary immunodeficiency.  No  Have you received COVID-19 vaccine before or during hematopoietic cell transplant (HCT) or CAR-T-cell therapies? No  Have you ever had an allergic reaction to: (This would include a severe allergic reaction or a reaction that caused hives, swelling, or respiratory distress, including wheezing.) A component of a COVID-19 vaccine or a previous dose of COVID-19 vaccine? No   Have you ever had an allergic reaction to another vaccine (other thanCOVID-19 vaccine) or an injectable medication? (This would include a severe allergic reaction or a reaction that caused hives, swelling, or respiratory distress, including wheezing.)   No    Do you have a history of any of the following:  Myocarditis or Pericarditis No  Dermal fillers:  No  Multisystem Inflammatory Syndrome (MIS-C or MIS-A)? No  COVID-19 disease within the past 3 months? No  Vaccinated with monkeypox vaccine in the last 4 weeks? No  Comirnaty +12Y 2023-24 Im right deltoid.  Tolerated well. VIS provided. COVID card updated and [facility] updated and copy provided. Waited 15 minutes.

## 2022-11-21 ENCOUNTER — Other Ambulatory Visit (INDEPENDENT_AMBULATORY_CARE_PROVIDER_SITE_OTHER): Payer: Self-pay | Admitting: Vascular Surgery

## 2022-11-21 DIAGNOSIS — I714 Abdominal aortic aneurysm, without rupture, unspecified: Secondary | ICD-10-CM

## 2022-11-21 DIAGNOSIS — I739 Peripheral vascular disease, unspecified: Secondary | ICD-10-CM

## 2022-11-22 ENCOUNTER — Ambulatory Visit (INDEPENDENT_AMBULATORY_CARE_PROVIDER_SITE_OTHER): Payer: BC Managed Care – PPO

## 2022-11-22 ENCOUNTER — Encounter (INDEPENDENT_AMBULATORY_CARE_PROVIDER_SITE_OTHER): Payer: Self-pay | Admitting: Vascular Surgery

## 2022-11-22 ENCOUNTER — Ambulatory Visit (INDEPENDENT_AMBULATORY_CARE_PROVIDER_SITE_OTHER): Payer: BC Managed Care – PPO | Admitting: Vascular Surgery

## 2022-11-22 VITALS — BP 135/71 | HR 53 | Resp 16 | Wt 174.6 lb

## 2022-11-22 DIAGNOSIS — I7143 Infrarenal abdominal aortic aneurysm, without rupture: Secondary | ICD-10-CM | POA: Diagnosis not present

## 2022-11-22 DIAGNOSIS — I739 Peripheral vascular disease, unspecified: Secondary | ICD-10-CM | POA: Diagnosis not present

## 2022-11-22 DIAGNOSIS — E119 Type 2 diabetes mellitus without complications: Secondary | ICD-10-CM

## 2022-11-22 DIAGNOSIS — I10 Essential (primary) hypertension: Secondary | ICD-10-CM

## 2022-11-22 DIAGNOSIS — E785 Hyperlipidemia, unspecified: Secondary | ICD-10-CM

## 2022-11-22 DIAGNOSIS — M7989 Other specified soft tissue disorders: Secondary | ICD-10-CM

## 2022-11-22 DIAGNOSIS — I6523 Occlusion and stenosis of bilateral carotid arteries: Secondary | ICD-10-CM

## 2022-11-22 DIAGNOSIS — I714 Abdominal aortic aneurysm, without rupture, unspecified: Secondary | ICD-10-CM

## 2022-11-22 NOTE — Assessment & Plan Note (Signed)
His EVAR duplex today shows a patent stent graft without endoleak.  The maximal sac diameter is only 2.8 cm.  Stable after endovascular repair over 2 years ago.  Doing well.  Continue to follow on an annual basis.

## 2022-11-22 NOTE — Progress Notes (Signed)
MRN : [id number]  Nathaniel Gamble is a 87 y.o. (08-07-1936) male who presents with chief complaint of  Chief Complaint  Patient presents with   Follow-up    Ultrasound follow up  .  History of Present Illness: Patient returns today in follow up of multiple issues.  He is doing reasonably well today.  He has been having a little more leg swelling in both lower extremities.  His left leg has had chronic swelling after an injury many years ago.  He started noticed some right leg swelling over time.  No open wounds or infection.  His legs really do not hurt him much.  He does not describe claudication, rest pain, or ulceration.  His ABIs today are reduced at 0.57 on the right and 0.64 on the left with digit pressures in the 70s bilaterally. He is over 2 years status post endovascular abdominal aortic aneurysm repair.  He is doing well and has no aneurysm related symptoms. Specifically, the patient denies new back or abdominal pain, or signs of peripheral embolization. His EVAR duplex today shows a patent stent graft without endoleak.  The maximal sac diameter is only 2.8 cm.    Current Outpatient Medications  Medication Sig Dispense Refill   acetaminophen (TYLENOL) 325 MG tablet Take 1-2 tablets (325-650 mg total) by mouth every 4 (four) hours as needed for mild pain (or temp >/= 101 F).     amLODipine (NORVASC) 10 MG tablet Take 1 tablet by mouth daily.     apixaban (ELIQUIS) 5 MG TABS tablet Place 5 mg into feeding tube 2 (two) times daily.     aspirin EC 81 MG EC tablet Take 1 tablet (81 mg total) by mouth daily at 6 (six) AM.     carvedilol (COREG) 25 MG tablet Place 25 mg into feeding tube 2 (two) times daily with a meal.      cyanocobalamin (VITAMIN B12) 1000 MCG tablet Take 1,000 mcg by mouth daily.     dapagliflozin propanediol (FARXIGA) 10 MG TABS tablet Take 10 mg by mouth daily.     hydrALAZINE (APRESOLINE) 10 MG tablet Take 1 tablet by mouth at bedtime.     losartan (COZAAR) 100 MG  tablet Take 100 mg by mouth daily.      multivitamin-lutein (OCUVITE-LUTEIN) CAPS capsule Take 1 capsule by mouth daily.     Nutritional Supplements (ISOSOURCE PO) Take by mouth 5 (five) times daily.     scopolamine (TRANSDERM-SCOP) 1 MG/3DAYS Place 1 patch onto the skin every 3 (three) days as needed.     senna (SENOKOT) 8.6 MG tablet Take 1 tablet by mouth 2 (two) times daily as needed for constipation.     simvastatin (ZOCOR) 40 MG tablet Take by mouth.     Vitamin D, Ergocalciferol, (DRISDOL) 50000 units CAPS capsule Take 50,000 Units by mouth once a week.   1   Azelastine HCl 137 MCG/SPRAY SOLN Place into both nostrils. (Patient not taking: Reported on 11/22/2022)     Baclofen 5 MG TABS Take by mouth as needed. (Patient not taking: Reported on 11/22/2022)     hydrALAZINE (APRESOLINE) 10 MG tablet Take 10 mg by mouth Nightly.     meclizine (ANTIVERT) 12.5 MG tablet Take 1 tablet (12.5 mg total) by mouth 3 (three) times daily as needed for dizziness or nausea. (Patient not taking: Reported on 11/22/2022) 30 tablet 0   ondansetron (ZOFRAN ODT) 4 MG disintegrating tablet Take 1 tablet (4 mg total) by mouth  every 6 (six) hours as needed for nausea or vomiting. (Patient not taking: Reported on 11/22/2022) 20 tablet 0   No current facility-administered medications for this visit.    Past Medical History:  Diagnosis Date   Abdominal aortic aneurysm ([place])    monitored by dr. Ubaldo Glassing   Abdominal aortic aneurysm ([place])    Cancer ([place]) 1976   melanoma, under right arm, removed   Chronic kidney disease 2011   stent right kidney   Coronary artery disease    Diabetes mellitus without complication ([place])    Hypertension    Jaw fracture ([place])    Myocardial infarction [facility])    Peripheral vascular disease ([place])    Renal artery stenosis ([place])    Stroke [facility])     Past Surgical History:  Procedure Laterality Date   ABDOMINAL AORTIC ENDOVASCULAR STENT GRAFT N/A 12/25/2019   Procedure: ABDOMINAL AORTIC  ENDOVASCULAR STENT GRAFT;  Surgeon: Algernon Huxley, MD;  Location: [facility] ORS;  Service: Vascular;  Laterality: N/A;   CATARACT EXTRACTION Bilateral 2014   CORONARY ARTERY BYPASS GRAFT  2010   ENDOVASCULAR REPAIR/STENT GRAFT N/A 12/25/2019   Procedure: ENDOVASCULAR REPAIR/STENT GRAFT;  Surgeon: Algernon Huxley, MD;  Location: [place] [facility];  Service: Cardiovascular;  Laterality: N/A;   INGUINAL HERNIA REPAIR Left 07/09/2015   Procedure: HERNIA REPAIR INGUINAL ADULT;  Surgeon: Leonie Green, MD;  Location: [facility] ORS;  Service: General;  Laterality: Left;   LOOP RECORDER INSERTION N/A 06/28/2018   Procedure: LOOP RECORDER INSERTION;  Surgeon: Isaias Cowman, MD;  Location: [place] [facility];  Service: Cardiovascular;  Laterality: N/A;   MANDIBLE FRACTURE SURGERY Left    RENAL ARTERY STENT Right      Social History   Tobacco Use   Smoking status: Former   Smokeless tobacco: Former  Substance Use Topics   Alcohol use: Yes    Alcohol/week: 1.0 standard drink of alcohol    Types: 1 Glasses of wine per week   Drug use: No      Family History  Problem Relation Age of Onset   Hyperlipidemia Mother    Stroke Mother    Hyperlipidemia Father      Allergies  Allergen Reactions   Oxycodone Nausea Only   Lisinopril Swelling   Tetanus Toxoids Swelling    REVIEW OF SYSTEMS (Negative unless checked)   Constitutional: '[]'$ Weight loss  '[]'$ Fever  '[]'$ Chills Cardiac: '[]'$ Chest pain   '[]'$ Chest pressure   '[]'$ Palpitations   '[]'$ Shortness of breath when laying flat   '[]'$ Shortness of breath at rest   '[]'$ Shortness of breath with exertion. Vascular:  '[]'$ Pain in legs with walking   '[]'$ Pain in legs at rest   '[]'$ Pain in legs when laying flat   '[]'$ Claudication   '[]'$ Pain in feet when walking  '[]'$ Pain in feet at rest  '[]'$ Pain in feet when laying flat   '[]'$ History of DVT   '[]'$ Phlebitis   '[x]'$ Swelling in legs   '[]'$ Varicose veins   '[]'$ Non-healing ulcers Pulmonary:   '[]'$ Uses home oxygen   '[]'$ Productive cough   '[]'$ Hemoptysis    '[]'$ Wheeze  '[]'$ COPD   '[]'$ Asthma Neurologic:  '[]'$ Dizziness  '[]'$ Blackouts   '[]'$ Seizures   '[x]'$ History of stroke   '[]'$ History of TIA  '[]'$ Aphasia   '[]'$ Temporary blindness   '[]'$ Dysphagia   '[]'$ Weakness or numbness in arms   '[]'$ Weakness or numbness in legs Musculoskeletal:  '[x]'$ Arthritis   '[]'$ Joint swelling   '[x]'$ Joint pain   '[]'$ Low back pain Hematologic:  '[]'$ Easy bruising  '[]'$ Easy bleeding   '[]'$ Hypercoagulable  state   '[]'$ Anemic   Gastrointestinal:  '[]'$ Blood in stool   '[]'$ Vomiting blood  '[]'$ Gastroesophageal reflux/heartburn   '[]'$ Abdominal pain Genitourinary:  '[x]'$ Chronic kidney disease   '[]'$ Difficult urination  '[]'$ Frequent urination  '[]'$ Burning with urination   '[]'$ Hematuria Skin:  '[]'$ Rashes   '[]'$ Ulcers   '[]'$ Wounds Psychological:  '[]'$ History of anxiety   '[]'$  History of major depression.  Physical Examination  BP 135/71 (BP Location: Left Arm)   Pulse (!) 53   Resp 16   Wt 174 lb 9.6 oz (79.2 kg)   BMI 25.05 kg/m  Gen:  WD/WN, NAD Head: [place]/AT, No temporalis wasting. Ear/Nose/Throat: Hearing grossly intact, nares w/o erythema or drainage Eyes: Conjunctiva clear. Sclera non-icteric Neck: Supple.  Trachea midline Pulmonary:  Good air movement, no use of accessory muscles.  Cardiac: RRR, no JVD Vascular:  Vessel Right Left  Radial Palpable Palpable                          PT 1+ Palpable Not Palpable  DP 1+ Palpable 1+ Palpable   Gastrointestinal: soft, non-tender/non-distended. No guarding/reflex.  Musculoskeletal: M/S 5/5 throughout.  No deformity or atrophy. No RLE edema, 1+ LLE edema. Neurologic: Sensation grossly intact in extremities.  Symmetrical.  Speech is fluent.  Psychiatric: Judgment intact, Mood & affect appropriate for pt's clinical situation. Dermatologic: No rashes or ulcers noted.  No cellulitis or open wounds.      Labs No results found for this or any previous visit (from the past 2160 hour(s)).  Radiology No results found.  Assessment/Plan Hypertension blood pressure control important in  reducing the progression of atherosclerotic disease and AAA growth. On appropriate oral medications.     Dyslipidemia lipid control important in reducing the progression of atherosclerotic disease. Continue statin therapy   Diabetes mellitus without complication (HCC) blood glucose control important in reducing the progression of atherosclerotic disease. Also, involved in wound healing. On appropriate medications.  Peripheral arterial disease ([place]) His ABIs today are reduced at 0.57 on the right and 0.64 on the left with digit pressures in the 70s bilaterally.  He has chronic and longstanding PAD without severe symptoms.  No role for intervention with the paucity of symptoms even with reduced ABIs.  Will continue to follow on an annual basis unless symptoms worsen.  AAA (abdominal aortic aneurysm) without rupture ([place]) His EVAR duplex today shows a patent stent graft without endoleak.  The maximal sac diameter is only 2.8 cm.  Stable after endovascular repair over 2 years ago.  Doing well.  Continue to follow on an annual basis.  Swelling of limb Still having some swelling although not really debilitating.  Compression socks and elevation recommended.  If his symptoms worsen, would consider lymphedema pump.    Leotis Pain, MD  11/22/2022 9:32 AM    This note was created with Dragon medical transcription system.  Any errors from dictation are purely unintentional

## 2022-11-22 NOTE — Assessment & Plan Note (Signed)
His ABIs today are reduced at 0.57 on the right and 0.64 on the left with digit pressures in the 70s bilaterally.  He has chronic and longstanding PAD without severe symptoms.  No role for intervention with the paucity of symptoms even with reduced ABIs.  Will continue to follow on an annual basis unless symptoms worsen.

## 2022-11-22 NOTE — Assessment & Plan Note (Signed)
Still having some swelling although not really debilitating.  Compression socks and elevation recommended.  If his symptoms worsen, would consider lymphedema pump.

## 2022-11-23 LAB — VAS US ABI WITH/WO TBI
Left ABI: 0.64
Right ABI: 0.57

## 2023-02-22 DIAGNOSIS — D696 Thrombocytopenia, unspecified: Secondary | ICD-10-CM | POA: Insufficient documentation

## 2023-05-15 ENCOUNTER — Encounter: Payer: Self-pay | Admitting: Emergency Medicine

## 2023-05-15 ENCOUNTER — Emergency Department
Admission: EM | Admit: 2023-05-15 | Discharge: 2023-05-15 | Disposition: A | Discharge status: Home / Self Care | Payer: BC Managed Care – PPO | Attending: Emergency Medicine | Admitting: Emergency Medicine

## 2023-05-15 ENCOUNTER — Emergency Department: Payer: BC Managed Care – PPO

## 2023-05-15 ENCOUNTER — Other Ambulatory Visit: Payer: Self-pay

## 2023-05-15 DIAGNOSIS — S60511A Abrasion of right hand, initial encounter: Secondary | ICD-10-CM | POA: Diagnosis not present

## 2023-05-15 DIAGNOSIS — W19XXXA Unspecified fall, initial encounter: Secondary | ICD-10-CM

## 2023-05-15 DIAGNOSIS — S60512A Abrasion of left hand, initial encounter: Secondary | ICD-10-CM | POA: Insufficient documentation

## 2023-05-15 DIAGNOSIS — S80212A Abrasion, left knee, initial encounter: Secondary | ICD-10-CM | POA: Insufficient documentation

## 2023-05-15 DIAGNOSIS — W108XXA Fall (on) (from) other stairs and steps, initial encounter: Secondary | ICD-10-CM | POA: Insufficient documentation

## 2023-05-15 DIAGNOSIS — S0990XA Unspecified injury of head, initial encounter: Secondary | ICD-10-CM | POA: Diagnosis present

## 2023-05-15 DIAGNOSIS — S80211A Abrasion, right knee, initial encounter: Secondary | ICD-10-CM | POA: Insufficient documentation

## 2023-05-15 DIAGNOSIS — T148XXA Other injury of unspecified body region, initial encounter: Secondary | ICD-10-CM

## 2023-05-15 NOTE — ED Provider Notes (Signed)
[facility] Provider Note    Event Date/Time   First MD Initiated Contact with Patient 05/15/23 1202     (approximate)   History   Fall   HPI  Nathaniel Gamble is a 87 y.o. male on blood thinners who presents after a mechanical fall.  Patient tripped going on the stairs fell forward onto the concrete of his driveway.  Landed on his knees and then hit the top of his head.  No LOC.  No neck pain, no back pain, no chest pain, no abdominal pain.  Ambulating well, did suffer some abrasions to his knees bilaterally and hands.  Otherwise feels well     Physical Exam   Triage Vital Signs: ED Triage Vitals  Enc Vitals Group     BP 05/15/23 1159 123/77     Pulse Rate 05/15/23 1159 66     Resp 05/15/23 1159 18     Temp 05/15/23 1157 98 F (36.7 C)     Temp Source 05/15/23 1157 Oral     SpO2 05/15/23 1159 99 %     Weight 05/15/23 1225 79.2 kg (174 lb 9.7 oz)     Height 05/15/23 1225 1.778 m (5\' 10" )     Head Circumference --      Peak Flow --      Pain Score 05/15/23 1157 0     Pain Loc --      Pain Edu? --      Excl. in GC? --     Most recent vital signs: Vitals:   05/15/23 1157 05/15/23 1159  BP:  123/77  Pulse:  66  Resp:  18  Temp: 98 F (36.7 C)   SpO2:  99%     General: Awake, no distress.  CV:  Good peripheral perfusion.  Resp:  Normal effort.  Abd:  No distention.  Other:  Large abrasion to the top of the head, minor abrasions to the hands bilaterally and knees.  Normal range of motion of all extremities.  No chest wall or abdominal tenderness.  No vertebral tenderness to palpation.  Normal range of motion of the neck without discomfort.  Neuro intact   ED Results / Procedures / Treatments   Labs (all labs ordered are listed, but only abnormal results are displayed) Labs Reviewed - No data to display   EKG     RADIOLOGY CT head viewed interpret by me, no evidence of ICH, confirmed by  radiology    PROCEDURES:  Critical Care performed:   Procedures   MEDICATIONS ORDERED IN ED: Medications - No data to display   IMPRESSION / MDM / ASSESSMENT AND PLAN / ED COURSE  I reviewed the triage vital signs and the nursing notes. Patient's presentation is most consistent with acute presentation with potential threat to life or bodily function.  Patient on Eliquis presents after a head injury due to mechanical fall.  Overall well-appearing neuro intact, reassuring exam  Wounds cleaned and dressed by nursing staff.  CT scan is reassuring,  No indication for admission at this time, return precautions discussed, patient agrees to this plan.        FINAL CLINICAL IMPRESSION(S) / ED DIAGNOSES   Final diagnoses:  Fall, initial encounter  Abrasion  Injury of head, initial encounter     Rx / DC Orders   ED Discharge Orders     None        Note:  This document was prepared using Dragon voice  recognition software and may include unintentional dictation errors.   Jene Every, MD 05/15/23 [phone]

## 2023-05-15 NOTE — ED Triage Notes (Signed)
Patient to ED via POV after fall. Patient states he fell going down the stairs- 2 stairs. Skinned the top of his head and right knee. Denies LOC but is on blood thinners. Bleeding controlled at this time.

## 2023-10-09 ENCOUNTER — Ambulatory Visit: Payer: BC Managed Care – PPO

## 2023-10-09 DIAGNOSIS — Z23 Encounter for immunization: Secondary | ICD-10-CM | POA: Diagnosis not present

## 2023-10-09 DIAGNOSIS — Z719 Counseling, unspecified: Secondary | ICD-10-CM

## 2023-10-09 NOTE — Progress Notes (Signed)
Patient seen in nurse clinic for COVID vaccine.  Comirnaty/Pfizer 2024-25 given.  Tolerated well. VIS provided. [facility] updated and copy provided. Waited 10 minutes.

## 2023-11-21 ENCOUNTER — Encounter (INDEPENDENT_AMBULATORY_CARE_PROVIDER_SITE_OTHER): Payer: Self-pay | Admitting: Vascular Surgery

## 2023-11-21 ENCOUNTER — Ambulatory Visit (INDEPENDENT_AMBULATORY_CARE_PROVIDER_SITE_OTHER): Payer: Medicare Other | Admitting: Vascular Surgery

## 2023-11-21 ENCOUNTER — Ambulatory Visit (INDEPENDENT_AMBULATORY_CARE_PROVIDER_SITE_OTHER): Payer: Medicare Other

## 2023-11-21 VITALS — BP 137/67 | HR 52 | Resp 18 | Ht 70.0 in | Wt 166.4 lb

## 2023-11-21 DIAGNOSIS — E119 Type 2 diabetes mellitus without complications: Secondary | ICD-10-CM

## 2023-11-21 DIAGNOSIS — I739 Peripheral vascular disease, unspecified: Secondary | ICD-10-CM

## 2023-11-21 DIAGNOSIS — I7143 Infrarenal abdominal aortic aneurysm, without rupture: Secondary | ICD-10-CM

## 2023-11-21 DIAGNOSIS — I10 Essential (primary) hypertension: Secondary | ICD-10-CM

## 2023-11-21 DIAGNOSIS — M7989 Other specified soft tissue disorders: Secondary | ICD-10-CM

## 2023-11-21 DIAGNOSIS — E785 Hyperlipidemia, unspecified: Secondary | ICD-10-CM

## 2023-11-21 NOTE — Progress Notes (Signed)
 MRN : [id number]  Nathaniel Gamble is a 88 y.o. (02/02/1936) male who presents with chief complaint of  Chief Complaint  Patient presents with   Follow-up    f/u in 1 year with [facility]  .  History of Present Illness: Patient returns today in follow up of multiple vascular issues.  He is 3 to 4 years status post endovascular abdominal aortic aneurysm repair.  He is doing well with this with no current aneurysm related symptoms.  His aneurysm duplex demonstrates a patent stent graft without endoleak and the previous aneurysm sac only measures 2.8 cm in maximal diameter.  He is also followed for peripheral arterial disease.  He currently does not have any lifestyle limiting claudication, ischemic rest pain, or ulceration.  ABIs today are stable at 0.58 on the right and 0.68 on the left. The patient also has some chronic left lower extremity swelling.  This is not particularly changed.  No ulceration or infection.  This is not painful to him.  He intermittently wears his compression socks.  Current Outpatient Medications  Medication Sig Dispense Refill   acetaminophen  (TYLENOL ) 325 MG tablet Take 1-2 tablets (325-650 mg total) by mouth every 4 (four) hours as needed for mild pain (or temp >/= 101 F).     amLODipine  (NORVASC ) 10 MG tablet Take 1 tablet by mouth daily.     apixaban  (ELIQUIS ) 5 MG TABS tablet Place 5 mg into feeding tube 2 (two) times daily.     aspirin  EC 81 MG EC tablet Take 1 tablet (81 mg total) by mouth daily at 6 (six) AM.     Azelastine HCl 137 MCG/SPRAY SOLN Place into both nostrils.     Baclofen 5 MG TABS Take by mouth as needed.     carvedilol  (COREG ) 25 MG tablet Place 25 mg into feeding tube 2 (two) times daily with a meal.      cyanocobalamin  (VITAMIN B12) 1000 MCG tablet Take 1,000 mcg by mouth daily.     dapagliflozin  propanediol (FARXIGA ) 10 MG TABS tablet Take 10 mg by mouth daily.     hydrALAZINE  (APRESOLINE ) 10 MG tablet Take 10 mg by mouth Nightly.      hydrALAZINE  (APRESOLINE ) 10 MG tablet Take 1 tablet by mouth at bedtime.     losartan  (COZAAR ) 100 MG tablet Take 100 mg by mouth daily.      multivitamin-lutein  (OCUVITE-LUTEIN ) CAPS capsule Take 1 capsule by mouth daily.     Nutritional Supplements (ISOSOURCE PO) Take by mouth 5 (five) times daily.     scopolamine  (TRANSDERM-SCOP) 1 MG/3DAYS Place 1 patch onto the skin every 3 (three) days as needed.     senna (SENOKOT) 8.6 MG tablet Take 1 tablet by mouth 2 (two) times daily as needed for constipation.     simvastatin  (ZOCOR ) 40 MG tablet Take by mouth.     tobramycin (TOBREX) 0.3 % ophthalmic solution SMARTSIG:In Eye(s)     Vitamin D , Ergocalciferol , (DRISDOL ) 50000 units CAPS capsule Take 50,000 Units by mouth once a week.   1   meclizine  (ANTIVERT ) 12.5 MG tablet Take 1 tablet (12.5 mg total) by mouth 3 (three) times daily as needed for dizziness or nausea. (Patient not taking: Reported on 11/22/2022) 30 tablet 0   ondansetron  (ZOFRAN  ODT) 4 MG disintegrating tablet Take 1 tablet (4 mg total) by mouth every 6 (six) hours as needed for nausea or vomiting. (Patient not taking: Reported on 11/22/2022) 20 tablet 0   No current facility-administered  medications for this visit.    Past Medical History:  Diagnosis Date   Abdominal aortic aneurysm (HCC)    monitored by dr. bosie   Abdominal aortic aneurysm (HCC)    Cancer (HCC) 1976   melanoma, under right arm, removed   Chronic kidney disease 2011   stent right kidney   Coronary artery disease    Diabetes mellitus without complication (HCC)    Hypertension    Jaw fracture (HCC)    Myocardial infarction [facility])    Peripheral vascular disease (HCC)    Renal artery stenosis (HCC)    Stroke [facility])     Past Surgical History:  Procedure Laterality Date   ABDOMINAL AORTIC ENDOVASCULAR STENT GRAFT N/A 12/25/2019   Procedure: ABDOMINAL AORTIC ENDOVASCULAR STENT GRAFT;  Surgeon: Marea Selinda RAMAN, MD;  Location: [facility] ORS;  Service: Vascular;  Laterality:  N/A;   CATARACT EXTRACTION Bilateral 2014   CORONARY ARTERY BYPASS GRAFT  2010   ENDOVASCULAR REPAIR/STENT GRAFT N/A 12/25/2019   Procedure: ENDOVASCULAR REPAIR/STENT GRAFT;  Surgeon: Marea Selinda RAMAN, MD;  Location: [facility] INVASIVE CV LAB;  Service: Cardiovascular;  Laterality: N/A;   INGUINAL HERNIA REPAIR Left 07/09/2015   Procedure: HERNIA REPAIR INGUINAL ADULT;  Surgeon: Larinda Unknown Sharps, MD;  Location: [facility] ORS;  Service: General;  Laterality: Left;   LOOP RECORDER INSERTION N/A 06/28/2018   Procedure: LOOP RECORDER INSERTION;  Surgeon: Ammon Blunt, MD;  Location: [facility] INVASIVE CV LAB;  Service: Cardiovascular;  Laterality: N/A;   MANDIBLE FRACTURE SURGERY Left    RENAL ARTERY STENT Right      Social History   Tobacco Use   Smoking status: Former   Smokeless tobacco: Former  Substance Use Topics   Alcohol use: Yes    Alcohol/week: 1.0 standard drink of alcohol    Types: 1 Glasses of wine per week   Drug use: No       Family History  Problem Relation Age of Onset   Hyperlipidemia Mother    Stroke Mother    Hyperlipidemia Father      Allergies  Allergen Reactions   Oxycodone  Nausea Only   Lisinopril Swelling   Tetanus Toxoids Swelling     REVIEW OF SYSTEMS (Negative unless checked)   Constitutional: [] Weight loss  [] Fever  [] Chills Cardiac: [] Chest pain   [] Chest pressure   [] Palpitations   [] Shortness of breath when laying flat   [] Shortness of breath at rest   [] Shortness of breath with exertion. Vascular:  [] Pain in legs with walking   [] Pain in legs at rest   [] Pain in legs when laying flat   [] Claudication   [] Pain in feet when walking  [] Pain in feet at rest  [] Pain in feet when laying flat   [] History of DVT   [] Phlebitis   [x] Swelling in legs   [] Varicose veins   [] Non-healing ulcers Pulmonary:   [] Uses home oxygen   [] Productive cough   [] Hemoptysis   [] Wheeze  [] COPD   [] Asthma Neurologic:  [] Dizziness  [] Blackouts   [] Seizures   [x] History of stroke    [] History of TIA  [] Aphasia   [] Temporary blindness   [] Dysphagia   [] Weakness or numbness in arms   [] Weakness or numbness in legs Musculoskeletal:  [x] Arthritis   [] Joint swelling   [x] Joint pain   [] Low back pain Hematologic:  [] Easy bruising  [] Easy bleeding   [] Hypercoagulable state   [] Anemic   Gastrointestinal:  [] Blood in stool   [] Vomiting blood  [] Gastroesophageal reflux/heartburn   [] Abdominal pain  Genitourinary:  [x] Chronic kidney disease   [] Difficult urination  [] Frequent urination  [] Burning with urination   [] Hematuria Skin:  [] Rashes   [] Ulcers   [] Wounds Psychological:  [] History of anxiety   []  History of major depression.  Physical Examination  BP 137/67   Pulse (!) 52   Resp 18   Ht 5' 10 (1.778 m)   Wt 166 lb 6.4 oz (75.5 kg)   BMI 23.88 kg/m  Gen:  WD/WN, NAD.  Appears younger than stated age. Head: [place]/AT, No temporalis wasting. Ear/Nose/Throat: Hearing grossly intact, nares w/o erythema or drainage Eyes: Conjunctiva clear. Sclera non-icteric Neck: Supple.  Trachea midline Pulmonary:  Good air movement, no use of accessory muscles.  Cardiac: RRR, no JVD Vascular:  Vessel Right Left  Radial Palpable Palpable                          PT 1+ Palpable Not Palpable  DP 1+ Palpable 1+ Palpable   Gastrointestinal: soft, non-tender/non-distended. No guarding/reflex.  Musculoskeletal: M/S 5/5 throughout.  No deformity or atrophy. 1+ LLE edema. Neurologic: Sensation grossly intact in extremities.  Symmetrical.  Speech is fluent.  Psychiatric: Judgment intact, Mood & affect appropriate for pt's clinical situation. Dermatologic: No rashes or ulcers noted.  No cellulitis or open wounds.      Labs No results found for this or any previous visit (from the past 2160 hours).  Radiology No results found.  Assessment/Plan  AAA (abdominal aortic aneurysm) without rupture (HCC) His aneurysm duplex demonstrates a patent stent graft without endoleak and the  previous aneurysm sac only measures 2.8 cm in maximal diameter.  Stable and doing well.  Continue to follow on an annual basis with duplex.  Peripheral arterial disease (HCC) ABIs today are stable at 0.58 on the right and 0.68 on the left.  He is not particularly symptomatic from this and no intervention would be planned.  He will continue his current medical regimen which includes aspirin , Eliquis , and Zocor .  Recheck in 1 year.  Swelling of limb Left lower extremity swelling is stable and not that bothersome to the patient.  Compression and elevation will be helpful in reducing swelling.  Hypertension blood pressure control important in reducing the progression of atherosclerotic disease and AAA growth. On appropriate oral medications.     Dyslipidemia lipid control important in reducing the progression of atherosclerotic disease. Continue statin therapy   Diabetes mellitus without complication (HCC) blood glucose control important in reducing the progression of atherosclerotic disease. Also, involved in wound healing. On appropriate medications.  Selinda Gu, MD  11/21/2023 9:39 AM    This note was created with Dragon medical transcription system.  Any errors from dictation are purely unintentional

## 2023-11-21 NOTE — Assessment & Plan Note (Signed)
 His aneurysm duplex demonstrates a patent stent graft without endoleak and the previous aneurysm sac only measures 2.8 cm in maximal diameter.  Stable and doing well.  Continue to follow on an annual basis with duplex.

## 2023-11-21 NOTE — Assessment & Plan Note (Signed)
 ABIs today are stable at 0.58 on the right and 0.68 on the left.  He is not particularly symptomatic from this and no intervention would be planned.  He will continue his current medical regimen which includes aspirin , Eliquis , and Zocor .  Recheck in 1 year.

## 2023-11-21 NOTE — Assessment & Plan Note (Signed)
 Left lower extremity swelling is stable and not that bothersome to the patient.  Compression and elevation will be helpful in reducing swelling.

## 2023-11-22 LAB — VAS US ABI WITH/WO TBI
Left ABI: 0.68
Right ABI: 0.58

## 2024-01-22 ENCOUNTER — Ambulatory Visit: Admitting: Podiatry

## 2024-01-22 ENCOUNTER — Encounter: Payer: Self-pay | Admitting: Podiatry

## 2024-01-22 DIAGNOSIS — M79676 Pain in unspecified toe(s): Secondary | ICD-10-CM | POA: Diagnosis not present

## 2024-01-22 DIAGNOSIS — E1142 Type 2 diabetes mellitus with diabetic polyneuropathy: Secondary | ICD-10-CM | POA: Diagnosis not present

## 2024-01-22 DIAGNOSIS — D689 Coagulation defect, unspecified: Secondary | ICD-10-CM

## 2024-01-22 DIAGNOSIS — B351 Tinea unguium: Secondary | ICD-10-CM

## 2024-01-22 DIAGNOSIS — D2372 Other benign neoplasm of skin of left lower limb, including hip: Secondary | ICD-10-CM | POA: Diagnosis not present

## 2024-01-22 NOTE — Progress Notes (Signed)
 Subjective:  Patient ID: Nathaniel Gamble, male    DOB: 23-Feb-1936,  MRN: [id number] HPI Chief Complaint  Patient presents with   Debridement    Trim toenails/callus plantar forefoot left - darkened, diabetic - 5.2, blood thinner   New Patient (Initial Visit)    Est pt 06/2021    87 y.o. male presents with the above complaint.   ROS: Denies fever chills no joint muscle aches pains calf pain back pain chest pain shortness of breath.  Past Medical History:  Diagnosis Date   Abdominal aortic aneurysm (HCC)    monitored by dr. Lady Gary   Abdominal aortic aneurysm (HCC)    Cancer (HCC) 1976   melanoma, under right arm, removed   Chronic kidney disease 2011   stent right kidney   Coronary artery disease    Diabetes mellitus without complication (HCC)    Hypertension    Jaw fracture (HCC)    Myocardial infarction [facility])    Peripheral vascular disease (HCC)    Renal artery stenosis (HCC)    Stroke [facility])    Past Surgical History:  Procedure Laterality Date   ABDOMINAL AORTIC ENDOVASCULAR STENT GRAFT N/A 12/25/2019   Procedure: ABDOMINAL AORTIC ENDOVASCULAR STENT GRAFT;  Surgeon: Annice Needy, MD;  Location: [facility] ORS;  Service: Vascular;  Laterality: N/A;   CATARACT EXTRACTION Bilateral 2014   CORONARY ARTERY BYPASS GRAFT  2010   ENDOVASCULAR REPAIR/STENT GRAFT N/A 12/25/2019   Procedure: ENDOVASCULAR REPAIR/STENT GRAFT;  Surgeon: Annice Needy, MD;  Location: [facility] INVASIVE CV LAB;  Service: Cardiovascular;  Laterality: N/A;   INGUINAL HERNIA REPAIR Left 07/09/2015   Procedure: HERNIA REPAIR INGUINAL ADULT;  Surgeon: Nadeen Landau, MD;  Location: [facility] ORS;  Service: General;  Laterality: Left;   LOOP RECORDER INSERTION N/A 06/28/2018   Procedure: LOOP RECORDER INSERTION;  Surgeon: Marcina Millard, MD;  Location: [facility] INVASIVE CV LAB;  Service: Cardiovascular;  Laterality: N/A;   MANDIBLE FRACTURE SURGERY Left    RENAL ARTERY STENT Right     Current Outpatient Medications:     acetaminophen (TYLENOL) 325 MG tablet, Take 1-2 tablets (325-650 mg total) by mouth every 4 (four) hours as needed for mild pain (or temp >/= 101 F)., Disp:  , Rfl:    amLODipine (NORVASC) 10 MG tablet, Take 1 tablet by mouth daily., Disp: , Rfl:    apixaban (ELIQUIS) 5 MG TABS tablet, Place 5 mg into feeding tube 2 (two) times daily., Disp: , Rfl:    aspirin EC 81 MG EC tablet, Take 1 tablet (81 mg total) by mouth daily at 6 (six) AM., Disp:  , Rfl:    Azelastine HCl 137 MCG/SPRAY SOLN, Place into both nostrils., Disp: , Rfl:    Baclofen 5 MG TABS, Take by mouth as needed., Disp: , Rfl:    carvedilol (COREG) 25 MG tablet, Place 25 mg into feeding tube 2 (two) times daily with a meal. , Disp: , Rfl:    cyanocobalamin (VITAMIN B12) 1000 MCG tablet, Take 1,000 mcg by mouth daily., Disp: , Rfl:    dapagliflozin propanediol (FARXIGA) 10 MG TABS tablet, Take 10 mg by mouth daily., Disp: , Rfl:    hydrALAZINE (APRESOLINE) 10 MG tablet, Take 10 mg by mouth Nightly., Disp: , Rfl:    hydrALAZINE (APRESOLINE) 10 MG tablet, Take 1 tablet by mouth at bedtime., Disp: , Rfl:    losartan (COZAAR) 100 MG tablet, Take 100 mg by mouth daily. , Disp: , Rfl:    multivitamin-lutein (OCUVITE-LUTEIN)  CAPS capsule, Take 1 capsule by mouth daily., Disp: , Rfl:    Nutritional Supplements (ISOSOURCE PO), Take by mouth 5 (five) times daily., Disp: , Rfl:    scopolamine (TRANSDERM-SCOP) 1 MG/3DAYS, Place 1 patch onto the skin every 3 (three) days as needed., Disp: , Rfl:    senna (SENOKOT) 8.6 MG tablet, Take 1 tablet by mouth 2 (two) times daily as needed for constipation., Disp: , Rfl:    simvastatin (ZOCOR) 40 MG tablet, Take by mouth., Disp: , Rfl:    tobramycin (TOBREX) 0.3 % ophthalmic solution, SMARTSIG:In Eye(s), Disp: , Rfl:    Vitamin D, Ergocalciferol, (DRISDOL) 50000 units CAPS capsule, Take 50,000 Units by mouth once a week. , Disp: , Rfl: 1  Allergies  Allergen Reactions   Oxycodone Nausea Only   Lisinopril  Swelling   Tetanus Toxoids Swelling   Review of Systems Objective:  There were no vitals filed for this visit.  General: Well developed, nourished, in no acute distress, alert and oriented x3   Dermatological: Skin is warm, dry and supple bilateral. Nails x 10 are well maintained; remaining integument appears unremarkable at this time. There are no open sores, no preulcerative lesions, no rash or signs of infection present.  Toenails are long thick yellow dystrophic with mycotic sharply incurvated and painful.  Painful lesion to the plantar aspect of the third metatarsal left foot significant adipose atrophy.  Vascular: Dorsalis Pedis artery and Posterior Tibial artery pedal pulses are 2/4 bilateral with immedate capillary fill time. Pedal hair growth present. No varicosities and no lower extremity edema present bilateral.   Neruologic: Grossly intact via light touch bilateral. Vibratory intact via tuning fork bilateral. Protective threshold with Semmes Wienstein monofilament intact to all pedal sites bilateral. Patellar and Achilles deep tendon reflexes 2+ bilateral. No Babinski or clonus noted bilateral.   Musculoskeletal: No gross boney pedal deformities bilateral. No pain, crepitus, or limitation noted with foot and ankle range of motion bilateral. Muscular strength 5/5 in all groups tested bilateral.  Gait: Unassisted, Nonantalgic.    Radiographs:  None taken  Assessment & Plan:   Assessment: Pain in limb secondary to onychomycosis and benign skin lesion.  Fat pad atrophy.  Plantarflexed metatarsals and metatarsalgia.  Edema left leg.  Plan: Debridement of toenails 1 through 5 bilateral debridement of reactive hyperkeratotic lesion placed padding for Salinocaine and aperture pad to be covered for 2 to 3 days without getting wet and then washed thoroughly.  Follow-up with him as needed     Franchot Pollitt T. [place], [place]

## 2024-04-02 ENCOUNTER — Encounter (INDEPENDENT_AMBULATORY_CARE_PROVIDER_SITE_OTHER): Payer: Self-pay

## 2024-04-18 ENCOUNTER — Emergency Department
Admission: EM | Admit: 2024-04-18 | Discharge: 2024-04-18 | Disposition: A | Discharge status: Home / Self Care | Attending: Emergency Medicine | Admitting: Emergency Medicine

## 2024-04-18 ENCOUNTER — Emergency Department

## 2024-04-18 DIAGNOSIS — Y92009 Unspecified place in unspecified non-institutional (private) residence as the place of occurrence of the external cause: Secondary | ICD-10-CM | POA: Diagnosis not present

## 2024-04-18 DIAGNOSIS — W01198A Fall on same level from slipping, tripping and stumbling with subsequent striking against other object, initial encounter: Secondary | ICD-10-CM | POA: Diagnosis not present

## 2024-04-18 DIAGNOSIS — E1122 Type 2 diabetes mellitus with diabetic chronic kidney disease: Secondary | ICD-10-CM | POA: Diagnosis not present

## 2024-04-18 DIAGNOSIS — I6782 Cerebral ischemia: Secondary | ICD-10-CM | POA: Insufficient documentation

## 2024-04-18 DIAGNOSIS — W19XXXA Unspecified fall, initial encounter: Secondary | ICD-10-CM

## 2024-04-18 DIAGNOSIS — S4991XA Unspecified injury of right shoulder and upper arm, initial encounter: Secondary | ICD-10-CM | POA: Diagnosis present

## 2024-04-18 DIAGNOSIS — N1832 Chronic kidney disease, stage 3b: Secondary | ICD-10-CM | POA: Insufficient documentation

## 2024-04-18 DIAGNOSIS — I129 Hypertensive chronic kidney disease with stage 1 through stage 4 chronic kidney disease, or unspecified chronic kidney disease: Secondary | ICD-10-CM | POA: Diagnosis not present

## 2024-04-18 NOTE — ED Triage Notes (Signed)
 Pt presents to the ED via [facility] from home for fall. Pt tripped on the floor and landed on right shoulder.

## 2024-04-18 NOTE — Discharge Instructions (Signed)
 You have been diagnosed with fall, soft tissue injury of the right shoulder.  CT scan of the head, cervical spine and x-ray of the shoulder did not show any acute abnormality.  You can take acetaminophen  325 mg 2 tablets by mouth every 4 hours as needed for pain.  You can apply ice in your right shoulder every for pain.  Please come back to ED or go to your PCP if you have new symptoms or symptoms worsen.

## 2024-04-18 NOTE — ED Triage Notes (Signed)
 First Nurse Note: Patient to ED via [facility] from home for a fall. PT reports tripping on the floor. C/o right shoulder and neck pain. Denies hitting head head or LOC. Does take blood thinners. VS WNL.

## 2024-04-18 NOTE — ED Provider Notes (Signed)
 [facility] Provider Note    Event Date/Time   First MD Initiated Contact with Patient 04/18/24 1804     (approximate)   History   Fall    HPI  Nathaniel Gamble is a 88 y.o. male    with a past medical history of abdominal aortic aneurysm, diabetes type 2, seizures like activity, peripheral artery disease, stage IIIb chronic kidney disease, hypertensive chronic kidney disease, renal artery stenosis, secondary hyperparathyroidism of renal origin, who presents to the ED complaining of fall. According to the patient he was in his house, he was trying to pick up a paper from the floor and tripped, hitting his right shoulder.  Patient denies hitting his head, loss of consciousness.  Patient is taking blood thinners.  Patient was brought by his neighbor      Physical Exam   Triage Vital Signs: ED Triage Vitals  Encounter Vitals Group     BP 04/18/24 1723 (!) 147/71     Systolic BP Percentile --      Diastolic BP Percentile --      Pulse Rate 04/18/24 1723 69     Resp 04/18/24 1723 18     Temp 04/18/24 1723 98.2 F (36.8 C)     Temp Source 04/18/24 1723 Oral     SpO2 04/18/24 1723 99 %     Weight 04/18/24 1724 149 lb (67.6 kg)     Height 04/18/24 1724 5\' 10"  (1.778 m)     Head Circumference --      Peak Flow --      Pain Score 04/18/24 1723 4     Pain Loc --      Pain Education --      Exclude from Growth Chart --     Most recent vital signs: Vitals:   04/18/24 1723 04/18/24 1833  BP: (!) 147/71   Pulse: 69   Resp: 18   Temp: 98.2 F (36.8 C)   SpO2: 99% 99%     Constitutional: Alert, NAD. Able to speak in complete sentences without cough or dyspnea  Eyes: Conjunctivae are normal.  Head: Atraumatic.  No tenderness to palpation Nose: No congestion/rhinnorhea. Mouth/Throat: Mucous membranes are moist.   Neck: Painless ROM. Supple. No JVD, nodes, thyromegaly.  No tenderness to palpation in spinal process or paraspinal  muscles Cardiovascular:   Good peripheral circulation.RRR no murmurs, gallops, rubs  Respiratory: Normal respiratory effort.  No retractions. Clear to auscultation bilaterally without wheezing or crackles  Gastrointestinal: Soft and nontender.  Musculoskeletal:  no deformity. Right shoulder: Skin is intact, mild tenderness to palpation in acromioclavicular joint, full ROM.  Sensation is intact, strength 5/5 Neurologic:  MAE spontaneously. No gross focal neurologic deficits are appreciated.  Skin:  Skin is warm, dry and intact. No rash noted. Psychiatric: Mood and affect are normal. Speech and behavior are normal.    ED Results / Procedures / Treatments   Labs (all labs ordered are listed, but only abnormal results are displayed) Labs Reviewed - No data to display   EKG    RADIOLOGY I independently reviewed and interpreted imaging and agree with radiologists findings.      PROCEDURES:  Critical Care performed:   Procedures   MEDICATIONS ORDERED IN ED: Medications - No data to display Clinical Course as of 04/18/24 1917  Thu Apr 18, 2024  1807 DG Shoulder Right No acute osseous abnormality. [AE]  1851 CT HEAD WO CONTRAST ( )  No acute intracranial abnormality. No  skull fracture. 2. Sequela of remote right MCA infarcts, unchanged. Stable atrophy and chronic small vessel ischemia.   [AE]    Clinical Course User Index [AE] Awilda Lennox, PA-C    IMPRESSION / MDM / ASSESSMENT AND PLAN / ED COURSE  I reviewed the triage vital signs and the nursing notes.  Differential diagnosis includes, but is not limited to, shoulder fracture, shoulder dislocation, soft tissue injury, intracranial hemorrhage, cervical fracture  Patient's presentation is most consistent with acute complicated illness / injury requiring diagnostic workup.   Nathaniel L Carteris a 64 y.o.male was brought by his neighbor after falling at home.  Patient did not hit his head or lose consciousness  but he is taking blood thinners.  Physical exam is reassuring. I independently reviewed and interpreted imaging and agree with radiologists findings ruling out acute intracranial abnormality, skull fracture, cervical fracture or shoulder osseous abnormality. Patient's diagnosis is consistent with mechanical fall, soft tissue injury of the right shoulder.  I did review the patient's allergies and medications.The patient is in stable and satisfactory condition for discharge home  Patient will be discharged home with prescriptions for acetaminophen . Patient is to follow up with PCP as needed or otherwise directed. Patient is given ED precautions to return to the ED for any worsening or new symptoms. Discussed plan of care with patient, answered all of patient's questions, Patient agreeable to plan of care. Advised patient to take medications according to the instructions on the label. Discussed possible side effects of new medications. Patient verbalized understanding.  FINAL CLINICAL IMPRESSION(S) / ED DIAGNOSES   Final diagnoses:  Fall, initial encounter  Injury of right shoulder, initial encounter     Rx / DC Orders   ED Discharge Orders     None        Note:  This document was prepared using Dragon voice recognition software and may include unintentional dictation errors.   Awilda Lennox, PA-C 04/18/24 1917    Bradler, Evan K, MD 04/18/24 [place]

## 2024-06-30 ENCOUNTER — Emergency Department

## 2024-06-30 ENCOUNTER — Other Ambulatory Visit: Payer: Self-pay

## 2024-06-30 ENCOUNTER — Inpatient Hospital Stay
Admission: EM | Admit: 2024-06-30 | Discharge: 2024-07-05 | DRG: 300 | Disposition: A | Discharge status: Home / Self Care | Attending: Internal Medicine | Admitting: Internal Medicine

## 2024-06-30 DIAGNOSIS — I129 Hypertensive chronic kidney disease with stage 1 through stage 4 chronic kidney disease, or unspecified chronic kidney disease: Secondary | ICD-10-CM | POA: Diagnosis present

## 2024-06-30 DIAGNOSIS — Z79899 Other long term (current) drug therapy: Secondary | ICD-10-CM

## 2024-06-30 DIAGNOSIS — I7090 Unspecified atherosclerosis: Secondary | ICD-10-CM | POA: Diagnosis not present

## 2024-06-30 DIAGNOSIS — Z83438 Family history of other disorder of lipoprotein metabolism and other lipidemia: Secondary | ICD-10-CM

## 2024-06-30 DIAGNOSIS — I10 Essential (primary) hypertension: Secondary | ICD-10-CM | POA: Diagnosis present

## 2024-06-30 DIAGNOSIS — N289 Disorder of kidney and ureter, unspecified: Secondary | ICD-10-CM

## 2024-06-30 DIAGNOSIS — E785 Hyperlipidemia, unspecified: Secondary | ICD-10-CM | POA: Diagnosis present

## 2024-06-30 DIAGNOSIS — Z888 Allergy status to other drugs, medicaments and biological substances status: Secondary | ICD-10-CM

## 2024-06-30 DIAGNOSIS — R230 Cyanosis: Secondary | ICD-10-CM | POA: Diagnosis present

## 2024-06-30 DIAGNOSIS — N2581 Secondary hyperparathyroidism of renal origin: Secondary | ICD-10-CM | POA: Diagnosis present

## 2024-06-30 DIAGNOSIS — I998 Other disorder of circulatory system: Principal | ICD-10-CM | POA: Diagnosis present

## 2024-06-30 DIAGNOSIS — N1832 Chronic kidney disease, stage 3b: Secondary | ICD-10-CM | POA: Diagnosis present

## 2024-06-30 DIAGNOSIS — I714 Abdominal aortic aneurysm, without rupture, unspecified: Secondary | ICD-10-CM | POA: Diagnosis not present

## 2024-06-30 DIAGNOSIS — M79605 Pain in left leg: Secondary | ICD-10-CM | POA: Diagnosis not present

## 2024-06-30 DIAGNOSIS — Y828 Other medical devices associated with adverse incidents: Secondary | ICD-10-CM | POA: Diagnosis present

## 2024-06-30 DIAGNOSIS — Z7984 Long term (current) use of oral hypoglycemic drugs: Secondary | ICD-10-CM | POA: Diagnosis not present

## 2024-06-30 DIAGNOSIS — Z7982 Long term (current) use of aspirin: Secondary | ICD-10-CM

## 2024-06-30 DIAGNOSIS — M1990 Unspecified osteoarthritis, unspecified site: Secondary | ICD-10-CM | POA: Diagnosis not present

## 2024-06-30 DIAGNOSIS — Z8679 Personal history of other diseases of the circulatory system: Secondary | ICD-10-CM

## 2024-06-30 DIAGNOSIS — Z7901 Long term (current) use of anticoagulants: Secondary | ICD-10-CM

## 2024-06-30 DIAGNOSIS — I70222 Atherosclerosis of native arteries of extremities with rest pain, left leg: Secondary | ICD-10-CM | POA: Diagnosis present

## 2024-06-30 DIAGNOSIS — N179 Acute kidney failure, unspecified: Secondary | ICD-10-CM | POA: Diagnosis present

## 2024-06-30 DIAGNOSIS — S90415A Abrasion, left lesser toe(s), initial encounter: Secondary | ICD-10-CM | POA: Diagnosis present

## 2024-06-30 DIAGNOSIS — M25472 Effusion, left ankle: Secondary | ICD-10-CM | POA: Diagnosis not present

## 2024-06-30 DIAGNOSIS — Z85820 Personal history of malignant melanoma of skin: Secondary | ICD-10-CM

## 2024-06-30 DIAGNOSIS — E1151 Type 2 diabetes mellitus with diabetic peripheral angiopathy without gangrene: Principal | ICD-10-CM | POA: Diagnosis present

## 2024-06-30 DIAGNOSIS — T82858A Stenosis of vascular prosthetic devices, implants and grafts, initial encounter: Secondary | ICD-10-CM | POA: Diagnosis not present

## 2024-06-30 DIAGNOSIS — I252 Old myocardial infarction: Secondary | ICD-10-CM | POA: Diagnosis not present

## 2024-06-30 DIAGNOSIS — I724 Aneurysm of artery of lower extremity: Secondary | ICD-10-CM | POA: Diagnosis present

## 2024-06-30 DIAGNOSIS — Z8673 Personal history of transient ischemic attack (TIA), and cerebral infarction without residual deficits: Secondary | ICD-10-CM

## 2024-06-30 DIAGNOSIS — Z95828 Presence of other vascular implants and grafts: Secondary | ICD-10-CM | POA: Diagnosis not present

## 2024-06-30 DIAGNOSIS — Z9842 Cataract extraction status, left eye: Secondary | ICD-10-CM

## 2024-06-30 DIAGNOSIS — I70202 Unspecified atherosclerosis of native arteries of extremities, left leg: Secondary | ICD-10-CM | POA: Diagnosis not present

## 2024-06-30 DIAGNOSIS — E1322 Other specified diabetes mellitus with diabetic chronic kidney disease: Secondary | ICD-10-CM | POA: Diagnosis not present

## 2024-06-30 DIAGNOSIS — I2510 Atherosclerotic heart disease of native coronary artery without angina pectoris: Secondary | ICD-10-CM | POA: Diagnosis present

## 2024-06-30 DIAGNOSIS — I70212 Atherosclerosis of native arteries of extremities with intermittent claudication, left leg: Secondary | ICD-10-CM | POA: Diagnosis not present

## 2024-06-30 DIAGNOSIS — D696 Thrombocytopenia, unspecified: Secondary | ICD-10-CM | POA: Diagnosis present

## 2024-06-30 DIAGNOSIS — X58XXXA Exposure to other specified factors, initial encounter: Secondary | ICD-10-CM | POA: Diagnosis present

## 2024-06-30 DIAGNOSIS — M25572 Pain in left ankle and joints of left foot: Secondary | ICD-10-CM | POA: Diagnosis present

## 2024-06-30 DIAGNOSIS — T82856A Stenosis of peripheral vascular stent, initial encounter: Secondary | ICD-10-CM | POA: Diagnosis present

## 2024-06-30 DIAGNOSIS — Z951 Presence of aortocoronary bypass graft: Secondary | ICD-10-CM | POA: Diagnosis not present

## 2024-06-30 DIAGNOSIS — Z885 Allergy status to narcotic agent status: Secondary | ICD-10-CM

## 2024-06-30 DIAGNOSIS — Z87891 Personal history of nicotine dependence: Secondary | ICD-10-CM

## 2024-06-30 DIAGNOSIS — I739 Peripheral vascular disease, unspecified: Secondary | ICD-10-CM | POA: Diagnosis not present

## 2024-06-30 DIAGNOSIS — Z9841 Cataract extraction status, right eye: Secondary | ICD-10-CM

## 2024-06-30 DIAGNOSIS — E1122 Type 2 diabetes mellitus with diabetic chronic kidney disease: Secondary | ICD-10-CM | POA: Diagnosis present

## 2024-06-30 DIAGNOSIS — M109 Gout, unspecified: Secondary | ICD-10-CM | POA: Diagnosis present

## 2024-06-30 DIAGNOSIS — Z823 Family history of stroke: Secondary | ICD-10-CM

## 2024-06-30 DIAGNOSIS — Z9889 Other specified postprocedural states: Secondary | ICD-10-CM | POA: Diagnosis not present

## 2024-06-30 DIAGNOSIS — N189 Chronic kidney disease, unspecified: Secondary | ICD-10-CM

## 2024-06-30 LAB — COMPREHENSIVE METABOLIC PANEL WITH GFR
ALT: 15 U/L (ref 0–44)
AST: 16 U/L (ref 15–41)
Albumin: 4.1 g/dL (ref 3.5–5.0)
Alkaline Phosphatase: 73 U/L (ref 38–126)
Anion gap: 11 (ref 5–15)
BUN: 45 mg/dL — ABNORMAL HIGH (ref 8–23)
CO2: 22 mmol/L (ref 22–32)
Calcium: 9.6 mg/dL (ref 8.9–10.3)
Chloride: 106 mmol/L (ref 98–111)
Creatinine, Ser: 2.31 mg/dL — ABNORMAL HIGH (ref 0.61–1.24)
GFR, Estimated: 27 mL/min — ABNORMAL LOW (ref >60)
Glucose, Bld: 119 mg/dL — ABNORMAL HIGH (ref 70–99)
Potassium: 5.3 mmol/L — ABNORMAL HIGH (ref 3.5–5.1)
Sodium: 139 mmol/L (ref 135–145)
Total Bilirubin: 1.1 mg/dL (ref 0.0–1.2)
Total Protein: 7.3 g/dL (ref 6.5–8.1)

## 2024-06-30 LAB — CBC WITH DIFFERENTIAL/PLATELET
Abs Granulocyte: 7.6 K/uL — ABNORMAL HIGH (ref 1.5–6.5)
Abs Immature Granulocytes: 0.04 K/uL (ref 0.00–0.07)
Basophils Absolute: 0 K/uL (ref 0.0–0.1)
Basophils Relative: 0 %
Eosinophils Absolute: 0.1 K/uL (ref 0.0–0.5)
Eosinophils Relative: 1 %
HCT: 41.9 % (ref 39.0–52.0)
Hemoglobin: 13.6 g/dL (ref 13.0–17.0)
Immature Granulocytes: 0 %
Lymphocytes Relative: 12 %
Lymphs Abs: 1.2 K/uL (ref 0.7–4.0)
MCH: 30.6 pg (ref 26.0–34.0)
MCHC: 32.5 g/dL (ref 30.0–36.0)
MCV: 94.2 fL (ref 80.0–100.0)
Monocytes Absolute: 0.9 K/uL (ref 0.1–1.0)
Monocytes Relative: 9 %
Neutro Abs: 7.6 K/uL (ref 1.7–7.7)
Neutrophils Relative %: 78 %
Platelets: 109 K/uL — ABNORMAL LOW (ref 150–400)
RBC: 4.45 MIL/uL (ref 4.22–5.81)
RDW: 14.7 % (ref 11.5–15.5)
WBC: 9.9 K/uL (ref 4.0–10.5)
nRBC: 0 % (ref 0.0–0.2)

## 2024-06-30 LAB — URINALYSIS, W/ REFLEX TO CULTURE (INFECTION SUSPECTED)
Bilirubin Urine: NEGATIVE
Glucose, UA: >=500 mg/dL — AB
Hgb urine dipstick: NEGATIVE
Ketones, ur: NEGATIVE mg/dL
Leukocytes,Ua: NEGATIVE
Nitrite: NEGATIVE
Protein, ur: 100 mg/dL — AB
Specific Gravity, Urine: 1.01 (ref 1.005–1.030)
Squamous Epithelial / HPF: 0 /HPF (ref 0–5)
pH: 6 (ref 5.0–8.0)

## 2024-06-30 LAB — HEPARIN LEVEL (UNFRACTIONATED): Heparin Unfractionated: >1.1 [IU]/mL — ABNORMAL HIGH (ref 0.30–0.70)

## 2024-06-30 LAB — PROTIME-INR
INR: 1.4 — ABNORMAL HIGH (ref 0.8–1.2)
Prothrombin Time: 17.4 s — ABNORMAL HIGH (ref 11.4–15.2)

## 2024-06-30 LAB — APTT: aPTT: 32 s (ref 24–36)

## 2024-06-30 LAB — LACTIC ACID, PLASMA: Lactic Acid, Venous: 1.2 mmol/L (ref 0.5–1.9)

## 2024-06-30 MED ORDER — HYDRALAZINE HCL 20 MG/ML IJ SOLN: 5.0000 mg | Freq: Four times a day (QID) | INTRAMUSCULAR | Status: DC | PRN

## 2024-06-30 MED ORDER — ACETAMINOPHEN 325 MG PO TABS
650.0000 mg | ORAL_TABLET | Freq: Four times a day (QID) | ORAL | Status: DC | PRN
  Administered 2024-07-02 – 2024-07-04 (×3): 650 mg via ORAL
  Filled 2024-06-30 (×3): qty 2

## 2024-06-30 MED ORDER — CARVEDILOL 25 MG PO TABS
25.0000 mg | ORAL_TABLET | Freq: Two times a day (BID) | ORAL | Status: DC
  Administered 2024-07-01 – 2024-07-05 (×8): 25 mg via ORAL
  Filled 2024-06-30 (×8): qty 1

## 2024-06-30 MED ORDER — SODIUM CHLORIDE 0.9% FLUSH
3.0000 mL | Freq: Two times a day (BID) | INTRAVENOUS | Status: DC
  Administered 2024-06-30 – 2024-07-05 (×9): 3 mL via INTRAVENOUS

## 2024-06-30 MED ORDER — AMLODIPINE BESYLATE 5 MG PO TABS
5.0000 mg | ORAL_TABLET | Freq: Every day | ORAL | Status: DC
  Administered 2024-07-01 – 2024-07-05 (×5): 5 mg via ORAL
  Filled 2024-06-30 (×5): qty 1

## 2024-06-30 MED ORDER — LOSARTAN POTASSIUM 50 MG PO TABS
100.0000 mg | ORAL_TABLET | Freq: Every day | ORAL | Status: DC
  Administered 2024-07-01 – 2024-07-04 (×4): 100 mg via ORAL
  Filled 2024-06-30 (×4): qty 2

## 2024-06-30 MED ORDER — VITAMIN B-12 1000 MCG PO TABS
1000.0000 ug | ORAL_TABLET | Freq: Every day | ORAL | Status: DC
  Administered 2024-07-01 – 2024-07-05 (×5): 1000 ug via ORAL
  Filled 2024-06-30 (×5): qty 1

## 2024-06-30 MED ORDER — INSULIN ASPART 100 UNIT/ML IJ SOLN
0.0000 [IU] | Freq: Three times a day (TID) | INTRAMUSCULAR | Status: DC
  Administered 2024-07-01: 2 [IU] via SUBCUTANEOUS
  Administered 2024-07-02 (×2): 1 [IU] via SUBCUTANEOUS
  Administered 2024-07-05: 2 [IU] via SUBCUTANEOUS
  Filled 2024-06-30 (×4): qty 1

## 2024-06-30 MED ORDER — DAPAGLIFLOZIN PROPANEDIOL 10 MG PO TABS
10.0000 mg | ORAL_TABLET | Freq: Every day | ORAL | Status: DC
  Administered 2024-07-01 – 2024-07-05 (×4): 10 mg via ORAL
  Filled 2024-06-30 (×5): qty 1

## 2024-06-30 MED ORDER — INSULIN ASPART 100 UNIT/ML IJ SOLN: 0.0000 [IU] | Freq: Every day | INTRAMUSCULAR | Status: DC

## 2024-06-30 MED ORDER — ASPIRIN 81 MG PO TBEC
81.0000 mg | DELAYED_RELEASE_TABLET | Freq: Every day | ORAL | Status: DC
  Administered 2024-07-01 – 2024-07-05 (×3): 81 mg via ORAL
  Filled 2024-06-30 (×3): qty 1

## 2024-06-30 MED ORDER — HYDROCODONE-ACETAMINOPHEN 5-325 MG PO TABS
1.0000 | ORAL_TABLET | ORAL | Status: DC | PRN
  Administered 2024-07-02 – 2024-07-04 (×2): 1 via ORAL
  Filled 2024-06-30: qty 1
  Filled 2024-06-30: qty 2

## 2024-06-30 MED ORDER — ISOSOURCE PO LIQD
Freq: Every day | ORAL | Status: DC
  Filled 2024-06-30: qty 237

## 2024-06-30 MED ORDER — HYDRALAZINE HCL 10 MG PO TABS
10.0000 mg | ORAL_TABLET | Freq: Every day | ORAL | Status: DC
Start: 2024-06-30 — End: 2024-07-05
  Administered 2024-06-30 – 2024-07-04 (×5): 10 mg via ORAL
  Filled 2024-06-30 (×5): qty 1

## 2024-06-30 MED ORDER — ONDANSETRON HCL 4 MG/2ML IJ SOLN
4.0000 mg | Freq: Four times a day (QID) | INTRAMUSCULAR | Status: DC | PRN
Start: 2024-06-30 — End: 2024-07-05

## 2024-06-30 MED ORDER — HEPARIN (PORCINE) 25000 UT/250ML-% IV SOLN
1200.0000 [IU]/h | INTRAVENOUS | Status: DC
  Administered 2024-06-30: 1200 [IU]/h via INTRAVENOUS
  Filled 2024-06-30: qty 250

## 2024-06-30 MED ORDER — SENNOSIDES-DOCUSATE SODIUM 8.6-50 MG PO TABS: 1.0000 | ORAL_TABLET | Freq: Every evening | ORAL | Status: DC | PRN

## 2024-06-30 MED ORDER — TRAZODONE HCL 50 MG PO TABS
25.0000 mg | ORAL_TABLET | Freq: Every evening | ORAL | Status: DC | PRN
  Administered 2024-07-02 – 2024-07-04 (×3): 25 mg via ORAL
  Filled 2024-06-30 (×3): qty 1

## 2024-06-30 MED ORDER — ACETAMINOPHEN 650 MG RE SUPP: 650.0000 mg | Freq: Four times a day (QID) | RECTAL | Status: DC | PRN

## 2024-06-30 MED ORDER — SODIUM CHLORIDE 0.9 % IV BOLUS
1000.0000 mL | Freq: Once | INTRAVENOUS | Status: AC
  Administered 2024-06-30: 1000 mL via INTRAVENOUS

## 2024-06-30 MED ORDER — MORPHINE SULFATE (PF) 2 MG/ML IV SOLN
1.0000 mg | Freq: Four times a day (QID) | INTRAVENOUS | Status: DC | PRN
  Administered 2024-07-01 – 2024-07-04 (×2): 1 mg via INTRAVENOUS
  Filled 2024-06-30 (×2): qty 1

## 2024-06-30 MED ORDER — OCUVITE-LUTEIN PO CAPS
1.0000 | ORAL_CAPSULE | Freq: Every day | ORAL | Status: DC
  Filled 2024-06-30 (×2): qty 1

## 2024-06-30 MED ORDER — ONDANSETRON HCL 4 MG PO TABS: 4.0000 mg | ORAL_TABLET | Freq: Four times a day (QID) | ORAL | Status: DC | PRN

## 2024-06-30 MED ORDER — HEPARIN SODIUM (PORCINE) 5000 UNIT/ML IJ SOLN: 4000.0000 [IU] | Freq: Once | INTRAMUSCULAR | Status: DC

## 2024-06-30 MED ORDER — SIMVASTATIN 20 MG PO TABS
40.0000 mg | ORAL_TABLET | Freq: Every day | ORAL | Status: DC
Start: 2024-06-30 — End: 2024-07-05
  Administered 2024-06-30 – 2024-07-04 (×5): 40 mg via ORAL
  Filled 2024-06-30 (×5): qty 2

## 2024-06-30 MED ORDER — SODIUM CHLORIDE 0.9 % IV SOLN: INTRAVENOUS | Status: DC

## 2024-06-30 MED ORDER — ACETAMINOPHEN 325 MG PO TABS
650.0000 mg | ORAL_TABLET | Freq: Once | ORAL | Status: AC
  Administered 2024-06-30: 650 mg via ORAL
  Filled 2024-06-30: qty 2

## 2024-06-30 MED ORDER — BISACODYL 5 MG PO TBEC: 5.0000 mg | DELAYED_RELEASE_TABLET | Freq: Every day | ORAL | Status: DC | PRN

## 2024-06-30 NOTE — ED Provider Triage Note (Signed)
 Emergency Medicine Provider Triage Evaluation Note  PSALM SCHAPPELL , a 88 y.o. male  was evaluated in triage.  Pt complains of bilateral feet swelling however L>R . left lower leg swelling, redness and pain noticed after church service today.   Review of Systems  Positive:  Negative: Fever, cp, SOB  Physical Exam  BP (!) 164/74   Pulse 95   Temp (!) 100.8 F (38.2 C)   Resp 17   Wt 68 kg   SpO2 99%   BMI 21.52 kg/m  Gen:   Awake, no distress   Resp:  Normal effort  MSK:   Moves extremities without difficulty  Other:  Pitting edema to left foot. Left LE edema > right. Tender to palpation toe left foot.  Medical Decision Making  Medically screening exam initiated at 4:01 PM.  Appropriate orders placed.  Jacques LITTIE Pepper was informed that the remainder of the evaluation will be completed by another provider, this initial triage assessment does not replace that evaluation, and the importance of remaining in the ED until their evaluation is complete.    Margrette, Didi Ganaway A, PA-C 06/30/24 1605

## 2024-06-30 NOTE — Consult Note (Signed)
 [facility] Vascular Consult Note  MRN : [id number]  Nathaniel Gamble is a 88 y.o. (22-Jan-1936) male who presents with chief complaint of  Chief Complaint  Patient presents with   Leg Swelling  .  History of Present Illness: Pleasant 88 year old gentleman well-known to the service.  History of EVAR in 2021, known PVD with his last ABI in January being right 0.58 left 0.68 asymptomatic.  Known chronic left lower extremity edema.  History of gout and osteoarthritis.  History of CKD 3B, diabetes mellitus, coronary artery disease status post CABG.  Compliant with Eliquis .  Patient presented after developing left foot and ankle pain this morning approximately 530 this morning.  He states he normally sleeps in a recliner and he states he awoke this morning and the recliner legs had dropped to the floor legs had dropped to the floor.  He states he does not recall how or when this happened.  He states he then got up and went to the restroom and noticed increased pain, swelling in the left ankle and foot.   The patient proceeded to go to church and noted that he had worsening swelling and pain in the ankle and foot.  He presented to the emergency room for evaluation.  The patient denied claudication or rest pain.  The patient states that he is able to ambulate without claudication and he works in the yard without difficulty..  He states prior to this morning he had no issues with his lower extremities other than swelling.  Upon evaluation in the ER there was concern as his left foot and toes were reportedly dusky.  Upon my evaluation the patient's toes and forefoot are erythematous however they are nontender.  He has some superficial abrasions over the second toe.  He states that according to his wife his toes always look that way and have not changed significantly from the past.  Non ABI arterial duplex revealed chronic short segment SFA occlusion, profunda femoris artery stenosis,  posterior tibial artery occlusion with patent ATA and DP.  Likely all chronic findings.  Current Facility-Administered Medications  Medication Dose Route Frequency Provider Last Rate Last Admin   heparin  ADULT infusion 100 units/mL (25000 units/250mL)  1,200 Units/hr Intravenous Continuous Elesa Perkins, RPH 12 mL/hr at 06/30/24 1812 1,200 Units/hr at 06/30/24 1812   Current Outpatient Medications  Medication Sig Dispense Refill   acetaminophen  (TYLENOL ) 325 MG tablet Take 1-2 tablets (325-650 mg total) by mouth every 4 (four) hours as needed for mild pain (or temp >/= 101 F).     amLODipine  (NORVASC ) 5 MG tablet Take 5 mg by mouth daily.     aspirin  EC 81 MG EC tablet Take 1 tablet (81 mg total) by mouth daily at 6 (six) AM.     carvedilol  (COREG ) 25 MG tablet Take 25 mg by mouth 2 (two) times daily with a meal.     cyanocobalamin  (VITAMIN B12) 1000 MCG tablet Take 1,000 mcg by mouth daily.     dapagliflozin  propanediol (FARXIGA ) 10 MG TABS tablet Take 10 mg by mouth daily.     ELIQUIS  2.5 MG TABS tablet Take 2.5 mg by mouth 2 (two) times daily.     hydrALAZINE  (APRESOLINE ) 10 MG tablet Take 1 tablet by mouth at bedtime.     losartan  (COZAAR ) 100 MG tablet Take 100 mg by mouth daily.      multivitamin-lutein  (OCUVITE-LUTEIN ) CAPS capsule Take 1 capsule by mouth daily.     simvastatin  (ZOCOR )  40 MG tablet Take 40 mg by mouth daily at 6 PM.     Vitamin D , Ergocalciferol , (DRISDOL ) 50000 units CAPS capsule Take 50,000 Units by mouth once a week.   1   Nutritional Supplements (ISOSOURCE PO) Take by mouth 5 (five) times daily.     tobramycin (TOBREX) 0.3 % ophthalmic solution SMARTSIG:In Eye(s) (Patient not taking: Reported on 06/30/2024)      Past Medical History:  Diagnosis Date   Abdominal aortic aneurysm (HCC)    monitored by dr. bosie   Abdominal aortic aneurysm (HCC)    Cancer (HCC) 1976   melanoma, under right arm, removed   Chronic kidney disease 2011   stent right kidney    Coronary artery disease    Diabetes mellitus without complication (HCC)    Hypertension    Jaw fracture (HCC)    Myocardial infarction (HCC)    Peripheral vascular disease (HCC)    Renal artery stenosis (HCC)    Stroke [facility])     Past Surgical History:  Procedure Laterality Date   ABDOMINAL AORTIC ENDOVASCULAR STENT GRAFT N/A 12/25/2019   Procedure: ABDOMINAL AORTIC ENDOVASCULAR STENT GRAFT;  Surgeon: Marea Selinda RAMAN, MD;  Location: [facility] ORS;  Service: Vascular;  Laterality: N/A;   CATARACT EXTRACTION Bilateral 2014   CORONARY ARTERY BYPASS GRAFT  2010   ENDOVASCULAR REPAIR/STENT GRAFT N/A 12/25/2019   Procedure: ENDOVASCULAR REPAIR/STENT GRAFT;  Surgeon: Marea Selinda RAMAN, MD;  Location: [facility] INVASIVE CV LAB;  Service: Cardiovascular;  Laterality: N/A;   INGUINAL HERNIA REPAIR Left 07/09/2015   Procedure: HERNIA REPAIR INGUINAL ADULT;  Surgeon: Larinda Unknown Sharps, MD;  Location: [facility] ORS;  Service: General;  Laterality: Left;   LOOP RECORDER INSERTION N/A 06/28/2018   Procedure: LOOP RECORDER INSERTION;  Surgeon: Ammon Blunt, MD;  Location: [facility] INVASIVE CV LAB;  Service: Cardiovascular;  Laterality: N/A;   MANDIBLE FRACTURE SURGERY Left    RENAL ARTERY STENT Right     Social History Social History   Tobacco Use   Smoking status: Former   Smokeless tobacco: Former  Substance Use Topics   Alcohol use: Yes    Alcohol/week: 1.0 standard drink of alcohol    Types: 1 Glasses of wine per week   Drug use: No    Family History Family History  Problem Relation Age of Onset   Hyperlipidemia Mother    Stroke Mother    Hyperlipidemia Father     Allergies  Allergen Reactions   Oxycodone  Nausea Only   Lisinopril Swelling   Tetanus Toxoids Swelling     REVIEW OF SYSTEMS (Negative unless checked)  Constitutional: [] Weight loss  [] Fever  [] Chills Cardiac: [] Chest pain   [] Chest pressure   [] Palpitations   [] Shortness of breath when laying flat   [] Shortness of breath at rest    [] Shortness of breath with exertion. Vascular:  [] Pain in legs with walking   [] Pain in legs at rest   [] Pain in legs when laying flat   [] Claudication   [] Pain in feet when walking  [] Pain in feet at rest  [] Pain in feet when laying flat   [] History of DVT   [] Phlebitis   [x] Swelling in legs   [] Varicose veins   [] Non-healing ulcers Pulmonary:   [] Uses home oxygen   [] Productive cough   [] Hemoptysis   [] Wheeze  [] COPD   [] Asthma Neurologic:  [] Dizziness  [] Blackouts   [] Seizures   [] History of stroke   [] History of TIA  [] Aphasia   [] Temporary blindness   [] Dysphagia   []   Weakness or numbness in arms   [] Weakness or numbness in legs Musculoskeletal:  [] Arthritis   [x] Joint swelling   [x] Joint pain   [] Low back pain Hematologic:  [] Easy bruising  [] Easy bleeding   [] Hypercoagulable state   [] Anemic  [] Hepatitis Gastrointestinal:  [] Blood in stool   [] Vomiting blood  [] Gastroesophageal reflux/heartburn   [] Difficulty swallowing. Genitourinary:  [x] Chronic kidney disease   [] Difficult urination  [] Frequent urination  [] Burning with urination   [] Blood in urine Skin:  [] Rashes   [] Ulcers   [] Wounds Psychological:  [] History of anxiety   []  History of major depression.  Physical Examination  Vitals:   06/30/24 1556 06/30/24 1800 06/30/24 1803 06/30/24 1830  BP:   (!) 141/61 (!) 144/65  Pulse:   88 88  Resp:   16 (!) 27  Temp:      SpO2:   94% 95%  Weight: 68 kg 68 kg    Height:  5' 10 (1.778 m)     Body mass index is 21.51 kg/m. Gen:  WD/WN, NAD Head: [place]/AT, No temporalis wasting. Prominent temp pulse not noted. Ear/Nose/Throat: Hearing grossly intact, nares w/o erythema or drainage, oropharynx w/o Erythema/Exudate Eyes: Sclera non-icteric, conjunctiva clear Neck: Trachea midline.  No JVD.  Pulmonary:  Good air movement, respirations not labored, equal bilaterally.  Cardiac: RRR, normal S1, S2. Vascular:  Vessel Right Left  Radial Palpable Palpable  Ulnar    Brachial    Carotid  Palpable, without bruit Palpable, without bruit  Aorta Not palpable N/A  Femoral Palpable Palpable  Popliteal    PT    DP Monophasic Monophasic   Gastrointestinal: soft, non-tender/non-distended. No guarding/reflex.  Musculoskeletal: Left lower extremity with 2+ pitting edema, the left ankle is edematous and the lateral malleolus is tender to touch, the foot is warm there is erythema of the forefoot and toes with superficial abrasion over the left second toe however I did not note any gross ischemia.  Foot is nontender to touch. Neurologic: Sensation grossly intact in extremities.  Symmetrical.  Speech is fluent. Motor exam as listed above. Psychiatric: Judgment intact, Mood & affect appropriate for pt's clinical situation. Dermatologic: No rashes or ulcers noted.  No cellulitis or open wounds. Lymph : No Cervical, Axillary, or Inguinal lymphadenopathy.     CBC Lab Results  Component Value Date   WBC 9.9 06/30/2024   HGB 13.6 06/30/2024   HCT 41.9 06/30/2024   MCV 94.2 06/30/2024   PLT 109 (L) 06/30/2024    BMET    Component Value Date/Time   NA 139 06/30/2024 1600   K 5.3 (H) 06/30/2024 1600   CL 106 06/30/2024 1600   CO2 22 06/30/2024 1600   GLUCOSE 119 (H) 06/30/2024 1600   BUN 45 (H) 06/30/2024 1600   CREATININE 2.31 (H) 06/30/2024 1600   CALCIUM  9.6 06/30/2024 1600   GFRNONAA 27 (L) 06/30/2024 1600   GFRAA 42 (L) 06/07/2020 1929   Estimated Creatinine Clearance: 22.5 mL/min (A) (by C-G formula based on SCr of 2.31 mg/dL (H)).  COAG Lab Results  Component Value Date   INR 1.4 (H) 06/30/2024   INR 1.4 (H) 12/23/2019   INR 1.4 (H) 07/20/2019    Radiology US  ARTERIAL LOWER EXTREMITY DUPLEX LEFT (NON-ABI) Result Date: 06/30/2024 CLINICAL DATA:  Ischemic toe EXAM: LEFT LOWER EXTREMITY ARTERIAL DUPLEX SCAN TECHNIQUE: Gray-scale sonography as well as color Doppler and duplex ultrasound was performed to evaluate the lower extremity arteries including the common,  superficial and profunda femoral arteries, popliteal artery  and calf arteries. COMPARISON:  None Available. FINDINGS: Left lower Extremity Inflow: Normal common femoral arterial waveforms and velocities. No evidence of inflow (aortoiliac) disease. Outflow: Focal occlusion of the superficial femoral artery in the mid thigh. The vessel reconstitutes as the popliteal artery. Abnormal post occlusive delayed waveforms in the popliteal artery. Atherosclerotic plaque results in significantly elevated peak systolic velocities in the proximal profunda femoral artery consistent with greater than 60% stenosis. Runoff: Abnormal monophasic arterial waveforms in the anterior tibial artery. The posterior tibial artery appears occluded. IMPRESSION: 1. Complete occlusion of the left superficial femoral artery in the mid and distal thigh. 2. Greater than 60% diameter stenosis of the origins of the profunda femoral artery. 3. Complete occlusion of the posterior tibial artery. Electronically Signed   By: Wilkie Lent M.D.   On: 06/30/2024 17:52   CT Head Wo Contrast Result Date: 06/30/2024 CLINICAL DATA:  Will likely need to start heparin  drip, prior history of ICH EXAM: CT HEAD WITHOUT CONTRAST TECHNIQUE: Contiguous axial images were obtained from the base of the skull through the vertex without intravenous contrast. RADIATION DOSE REDUCTION: This exam was performed according to the departmental dose-optimization program which includes automated exposure control, adjustment of the mA and/or kV according to patient size and/or use of iterative reconstruction technique. COMPARISON:  CT head June 5, 25. FINDINGS: Brain: No evidence of acute infarction, hemorrhage, hydrocephalus, extra-axial collection or mass lesion/mass effect. Remote right posterior MCA territory infarcts, unchanged. Vascular: Calcific atherosclerosis.  No hyperdense vessel. Skull: No acute fracture. Sinuses/Orbits: Clear sinuses.  No acute orbital findings.  Other: No mastoid effusions. IMPRESSION: 1. No evidence of acute intracranial abnormality. 2. Unchanged remote right posterior MCA territory infarcts. Electronically Signed   By: Gilmore GORMAN Molt M.D.   On: 06/30/2024 17:20   DG Chest 2 View Result Date: 06/30/2024 CLINICAL DATA:  Infection. Bilateral feet and ankle swelling. Difficulty walking. EXAM: CHEST - 2 VIEW COMPARISON:  None Available. FINDINGS: The heart is enlarged and the mediastinal contour is within normal limits. There is atherosclerotic calcification of the aorta. The pulmonary vasculature is distended. There is a small right pleural effusion. No pneumothorax is seen. Airspace disease is noted in the lingular segment of the left upper lobe. A loop recorder device is present over the left chest. Sternotomy wires are noted over the midline. No acute osseous abnormality. IMPRESSION: 1. Cardiomegaly with distended pulmonary vasculature. 2. Airspace disease in the left upper lobe, possible atelectasis or infiltrate. 3. Small right pleural effusion. Electronically Signed   By: Leita Birmingham M.D.   On: 06/30/2024 17:01      Assessment/Plan 1. LEFT Ankle/foot pain- known PVD known gout and osteoarthritis. Obtain left ankle x-ray-rule out arthritis, gouty flare or other musculoskeletal etiology as his pain is primarly localized to the left ankle. Obtain ABI-for baseline comparison from January where the left ABI was 0.68.  Monitor exam. Cautious use of heparin  drip as his presentation and location of pain is not consistent with classic acute ischemia. Further recommendations will be made once we obtain the above studies.  If angiogram is performed this will have to be done cautiously as he has CKD 3B with creatinine 2.3. No immediate intervention planned at this juncture.   Tisa Curry LABOR, MD  06/30/2024 7:05 PM    This note was created with Dragon medical transcription system.  Any error is purely unintentional

## 2024-06-30 NOTE — ED Notes (Addendum)
 This nurse called rec floor to have rec RN complete handoff. Per [employer] no nurse assigned at this time.

## 2024-06-30 NOTE — H&P (Signed)
 History and Physical   [facility] [place] @ [facility] Admission History and Physical [employer], D.O.    Patient Name: Nathaniel Gamble MR#: [id number] Date of Birth: 1936-01-10 Date of Admission: 06/30/2024  Referring MD/NP/PA: Dr. Nicholaus Primary Care Physician: Nathaniel Norleen BIRCH, MD  Chief Complaint:  Chief Complaint  Patient presents with   Leg Swelling    HPI: Nathaniel Gamble is a 88 y.o. male with a known history of abdominal aortic aneurysm, melanoma, CKD 3B with secondary hyperparathyroidism, CAD status post MI and CVA, diabetes, hypertension, intracranial hemorrhage 4 years ago, peripheral vascular disease on Eliquis , renal artery stenosis presents to the emergency department for evaluation of lower extremity swelling.  Patient was in a usual state of health until this morning at 5:30 AM he woke up with sudden onset of severe left lower extremity pain and swelling, noticed discoloration of his left second toe.  Patient reports that his pain is significantly improved  Patient denies fevers/chills, weakness, dizziness, chest pain, shortness of breath, N/V/C/D, abdominal pain, dysuria/frequency, changes in mental status.    Otherwise there has been no change in status. Patient has been taking medication as prescribed and there has been no recent change in medication or diet.  No recent antibiotics.  There has been no recent illness, hospitalizations, travel or sick contacts.    EMS/ED Course: Patient received Tylenol , normal saline, heparin  infusion without bolus. Medical admission has been requested for further management of ischemic limb.  Review of Systems:  CONSTITUTIONAL: No fever/chills, fatigue, weakness, weight gain/loss, headache. EYES: No blurry or double vision. ENT: No pharyngitis RESPIRATORY: No cough, dyspnea, wheeze.  No hemoptysis.  CARDIOVASCULAR: No chest pain, palpitations, syncope, orthopnea. No lower extremity edema.  GASTROINTESTINAL: No nausea,  vomiting, abdominal pain, diarrhea, constipation.  No hematemesis, melena or hematochezia. GENITOURINARY: No dysuria, frequency, hematuria. ENDOCRINE: No polyuria or nocturia.  HEMATOLOGY: No anemia, bruising, bleeding. INTEGUMENTARY: No rashes, ulcers, lesions. MUSCULOSKELETAL: Pain swelling and discoloration of his left lower extremity as per HPI NEUROLOGIC: No numbness, tingling, ataxia, seizure-type activity, weakness. PSYCHIATRIC: No anxiety, depression, insomnia.   Past Medical History:  Diagnosis Date   Abdominal aortic aneurysm (HCC)    monitored by dr. bosie   Abdominal aortic aneurysm (HCC)    Cancer (HCC) 1976   melanoma, under right arm, removed   Chronic kidney disease 2011   stent right kidney   Coronary artery disease    Diabetes mellitus without complication (HCC)    Hypertension    Jaw fracture (HCC)    Myocardial infarction [facility])    Peripheral vascular disease (HCC)    Renal artery stenosis (HCC)    Stroke [facility])     Past Surgical History:  Procedure Laterality Date   ABDOMINAL AORTIC ENDOVASCULAR STENT GRAFT N/A 12/25/2019   Procedure: ABDOMINAL AORTIC ENDOVASCULAR STENT GRAFT;  Surgeon: Marea Selinda RAMAN, MD;  Location: [facility] ORS;  Service: Vascular;  Laterality: N/A;   CATARACT EXTRACTION Bilateral 2014   CORONARY ARTERY BYPASS GRAFT  2010   ENDOVASCULAR REPAIR/STENT GRAFT N/A 12/25/2019   Procedure: ENDOVASCULAR REPAIR/STENT GRAFT;  Surgeon: Marea Selinda RAMAN, MD;  Location: [facility] INVASIVE CV LAB;  Service: Cardiovascular;  Laterality: N/A;   INGUINAL HERNIA REPAIR Left 07/09/2015   Procedure: HERNIA REPAIR INGUINAL ADULT;  Surgeon: Larinda Unknown Sharps, MD;  Location: [facility] ORS;  Service: General;  Laterality: Left;   LOOP RECORDER INSERTION N/A 06/28/2018   Procedure: LOOP RECORDER INSERTION;  Surgeon: Ammon Blunt, MD;  Location: [facility] INVASIVE CV LAB;  Service: Cardiovascular;  Laterality: N/A;   MANDIBLE FRACTURE SURGERY Left    RENAL ARTERY STENT Right       reports that he has quit smoking. He has quit using smokeless tobacco. He reports current alcohol use of about 1.0 standard drink of alcohol per week. He reports that he does not use drugs.  Allergies  Allergen Reactions   Oxycodone  Nausea Only   Lisinopril Swelling   Tetanus Toxoids Swelling    Family History  Problem Relation Age of Onset   Hyperlipidemia Mother    Stroke Mother    Hyperlipidemia Father     Prior to Admission medications   Medication Sig Start Date End Date Taking? Authorizing Provider  acetaminophen  (TYLENOL ) 325 MG tablet Take 1-2 tablets (325-650 mg total) by mouth every 4 (four) hours as needed for mild pain (or temp >/= 101 F). 12/26/19  Yes Stegmayer, Suzen A, PA-C  amLODipine  (NORVASC ) 5 MG tablet Take 5 mg by mouth daily.   Yes [provider]  aspirin  EC 81 MG EC tablet Take 1 tablet (81 mg total) by mouth daily at 6 (six) AM. 12/27/19  Yes Stegmayer, Suzen LABOR, PA-C  carvedilol  (COREG ) 25 MG tablet Take 25 mg by mouth 2 (two) times daily with a meal.   Yes [provider]  cyanocobalamin  (VITAMIN B12) 1000 MCG tablet Take 1,000 mcg by mouth daily.   Yes [provider]  dapagliflozin  propanediol (FARXIGA ) 10 MG TABS tablet Take 10 mg by mouth daily.   Yes [provider]  ELIQUIS  2.5 MG TABS tablet Take 2.5 mg by mouth 2 (two) times daily.   Yes [provider]  hydrALAZINE  (APRESOLINE ) 10 MG tablet Take 1 tablet by mouth at bedtime. 05/04/21  Yes [provider]  losartan  (COZAAR ) 100 MG tablet Take 100 mg by mouth daily.  11/26/18  Yes [provider]  multivitamin-lutein  (OCUVITE-LUTEIN ) CAPS capsule Take 1 capsule by mouth daily.   Yes [provider]  simvastatin  (ZOCOR ) 40 MG tablet Take 40 mg by mouth daily at 6 PM. 06/24/19  Yes [provider]  Vitamin D , Ergocalciferol , (DRISDOL ) 50000 units CAPS capsule Take 50,000 Units by mouth once a week.  05/19/18  Yes [provider]  Nutritional Supplements (ISOSOURCE PO) Take by mouth 5 (five) times daily.    [provider]  tobramycin (TOBREX) 0.3 % ophthalmic solution SMARTSIG:In Eye(s) Patient not taking: Reported on 06/30/2024 10/30/23   [provider]    Physical Exam: Vitals:   06/30/24 1800 06/30/24 1803 06/30/24 1830 06/30/24 1900  BP:  (!) 141/61 (!) 144/65 (!) 143/56  Pulse:  88 88 89  Resp:  16 (!) 27 (!) 30  Temp:      SpO2:  94% 95% 95%  Weight: 68 kg     Height: 5' 10 (1.778 m)       GENERAL: 88 y.o.-year-old white male patient, well-developed, well-nourished lying in the bed in no acute distress.  Pleasant and cooperative.   HEENT: Head atraumatic, normocephalic. Mucus membranes moist. NECK: Supple. No JVD. CHEST: Normal breath sounds bilaterally. No wheezing, rales, rhonchi or crackles. No use of accessory muscles of respiration.  No reproducible chest wall tenderness.  CARDIOVASCULAR: S1, S2 normal. No murmurs, rubs, or gallops. Cap refill <2 seconds. Pulses intact distally.  ABDOMEN: Soft, nondistended, nontender. No rebound, guarding, rigidity. Normoactive bowel sounds present in all four quadrants.  EXTREMITIES: Left lower extremity edema, nonpitting.  Cyanotic left second toe.  No  edema of the right lower extremity NEUROLOGIC: The patient is alert and oriented x 3. Cranial nerves II through XII are grossly intact with no focal sensorimotor deficit. PSYCHIATRIC:  Normal affect, mood, thought content. SKIN: Warm, dry, and intact without obvious rash, lesion, or ulcer.    Labs on Admission:  CBC: Recent Labs  Lab 06/30/24 1600  WBC 9.9  NEUTROABS 7.6  HGB 13.6  HCT 41.9  MCV 94.2  PLT 109*   Basic Metabolic Panel: Recent Labs  Lab 06/30/24 1600  NA 139  K 5.3*  CL 106  CO2 22  GLUCOSE 119*  BUN 45*  CREATININE 2.31*  CALCIUM  9.6   GFR: Estimated Creatinine Clearance: 22.5 mL/min (A) (by C-G formula based on SCr of 2.31 mg/dL  (H)). Liver Function Tests: Recent Labs  Lab 06/30/24 1600  AST 16  ALT 15  ALKPHOS 73  BILITOT 1.1  PROT 7.3  ALBUMIN 4.1   No results for input(s): LIPASE, AMYLASE in the last 168 hours. No results for input(s): AMMONIA in the last 168 hours. Coagulation Profile: Recent Labs  Lab 06/30/24 1625  INR 1.4*   Cardiac Enzymes: No results for input(s): CKTOTAL, CKMB, CKMBINDEX, TROPONINI in the last 168 hours. BNP (last 3 results) No results for input(s): PROBNP in the last 8760 hours. HbA1C: No results for input(s): HGBA1C in the last 72 hours. CBG: No results for input(s): GLUCAP in the last 168 hours. Lipid Profile: No results for input(s): CHOL, HDL, LDLCALC, TRIG, CHOLHDL, LDLDIRECT in the last 72 hours. Thyroid Function Tests: No results for input(s): TSH, T4TOTAL, FREET4, T3FREE, THYROIDAB in the last 72 hours. Anemia Panel: No results for input(s): VITAMINB12, FOLATE, FERRITIN, TIBC, IRON, RETICCTPCT in the last 72 hours. Urine analysis:    Component Value Date/Time   COLORURINE STRAW (A) 06/30/2024 1600   APPEARANCEUR CLEAR (A) 06/30/2024 1600   LABSPEC 1.010 06/30/2024 1600   PHURINE 6.0 06/30/2024 1600   GLUCOSEU >=500 (A) 06/30/2024 1600   HGBUR NEGATIVE 06/30/2024 1600   BILIRUBINUR NEGATIVE 06/30/2024 1600   KETONESUR NEGATIVE 06/30/2024 1600   PROTEINUR 100 (A) 06/30/2024 1600   NITRITE NEGATIVE 06/30/2024 1600   LEUKOCYTESUR NEGATIVE 06/30/2024 1600   Sepsis Labs: @LABRCNTIP (procalcitonin:4,lacticidven:4) )No results found for this or any previous visit (from the past 240 hours).   Radiological Exams on Admission: US  ARTERIAL LOWER EXTREMITY DUPLEX LEFT (NON-ABI) Result Date: 06/30/2024 CLINICAL DATA:  Ischemic toe EXAM: LEFT LOWER EXTREMITY ARTERIAL DUPLEX SCAN TECHNIQUE: Gray-scale sonography as well as color Doppler and duplex ultrasound was performed to evaluate the lower extremity arteries  including the common, superficial and profunda femoral arteries, popliteal artery and calf arteries. COMPARISON:  None Available. FINDINGS: Left lower Extremity Inflow: Normal common femoral arterial waveforms and velocities. No evidence of inflow (aortoiliac) disease. Outflow: Focal occlusion of the superficial femoral artery in the mid thigh. The vessel reconstitutes as the popliteal artery. Abnormal post occlusive delayed waveforms in the popliteal artery. Atherosclerotic plaque results in significantly elevated peak systolic velocities in the proximal profunda femoral artery consistent with greater than 60% stenosis. Runoff: Abnormal monophasic arterial waveforms in the anterior tibial artery. The posterior tibial artery appears occluded. IMPRESSION: 1. Complete occlusion of the left superficial femoral artery in the mid and distal thigh. 2. Greater than 60% diameter stenosis of the origins of the profunda femoral artery. 3. Complete occlusion of the posterior tibial artery. Electronically Signed   By: Wilkie Lent M.D.   On: 06/30/2024 17:52   CT Head Wo  Contrast Result Date: 06/30/2024 CLINICAL DATA:  Will likely need to start heparin  drip, prior history of ICH EXAM: CT HEAD WITHOUT CONTRAST TECHNIQUE: Contiguous axial images were obtained from the base of the skull through the vertex without intravenous contrast. RADIATION DOSE REDUCTION: This exam was performed according to the departmental dose-optimization program which includes automated exposure control, adjustment of the mA and/or kV according to patient size and/or use of iterative reconstruction technique. COMPARISON:  CT head June 5, 25. FINDINGS: Brain: No evidence of acute infarction, hemorrhage, hydrocephalus, extra-axial collection or mass lesion/mass effect. Remote right posterior MCA territory infarcts, unchanged. Vascular: Calcific atherosclerosis.  No hyperdense vessel. Skull: No acute fracture. Sinuses/Orbits: Clear sinuses.  No  acute orbital findings. Other: No mastoid effusions. IMPRESSION: 1. No evidence of acute intracranial abnormality. 2. Unchanged remote right posterior MCA territory infarcts. Electronically Signed   By: Gilmore GORMAN Molt M.D.   On: 06/30/2024 17:20   DG Chest 2 View Result Date: 06/30/2024 CLINICAL DATA:  Infection. Bilateral feet and ankle swelling. Difficulty walking. EXAM: CHEST - 2 VIEW COMPARISON:  None Available. FINDINGS: The heart is enlarged and the mediastinal contour is within normal limits. There is atherosclerotic calcification of the aorta. The pulmonary vasculature is distended. There is a small right pleural effusion. No pneumothorax is seen. Airspace disease is noted in the lingular segment of the left upper lobe. A loop recorder device is present over the left chest. Sternotomy wires are noted over the midline. No acute osseous abnormality. IMPRESSION: 1. Cardiomegaly with distended pulmonary vasculature. 2. Airspace disease in the left upper lobe, possible atelectasis or infiltrate. 3. Small right pleural effusion. Electronically Signed   By: Leita Birmingham M.D.   On: 06/30/2024 17:01     Assessment/Plan  This is a 88 y.o. male with a history of abdominal aortic aneurysm, melanoma, CKD 3B with secondary hyperparathyroidism, CAD status post MI and CVA, diabetes, hypertension, intracranial hemorrhage 4 years ago, peripheral vascular disease on Eliquis , renal artery stenosis now being admitted with:  #.  Ischemic left second toe  Ultrasound of the left lower extremity shows complete occlusion of the left superficial femoral artery in the mid and distal thigh as well as greater than 60% stenosis of the origin of the profundofemoral artery and complete occlusion of the posterior tibial artery -Admit to inpatient service - Continue heparin  infusion without bolus per pharmacy recommendations given history of intracranial hemorrhage - Holding Eliquis  for now given heparin  drip - Vascular  surgery consult appreciated - To include left ankle x-ray and ABI -No SCDs given concern for ischemia -Fall precautions  #.  AKI on CKD Acute kidney injury  - IV fluids and repeat BMP in AM.  - Avoid nephrotoxic medications -Holding losartan  - Bladder scan and place foley catheter if evidence of urinary retention - Check renal US   #.  History of CAD - Continue aspirin   #. History of hypertension - Continue amlodipine , carvedilol , hydralazine  # Hold losartan  for now given AKI losartan   #. History of hyperlipidemia - Continue simvastatin   #. History of H/o Diabetes - Accuchecks achs with RISS coverage - Heart healthy, carb controlled diet - Continue Farxiga   Admission status: Inpatient IV Fluids: Normal saline heart healthy Diet/Nutrition:, Carb controlled Consults called: Vascular surgery, Dr. Tisa DVT Px: Heparin  drip and early ambulation. Code Status: Full Code  Disposition Plan: To home in 1-2 days  All the records are reviewed and case discussed with ED provider. Management plans discussed with the patient and/or family who  express understanding and agree with plan of care.  Islam Eichinger D.O. on 06/30/2024 at 7:13 PM CC: Primary care physician; Nathaniel Norleen BIRCH, MD   06/30/2024, 7:13 PM

## 2024-06-30 NOTE — ED Provider Notes (Signed)
 [facility] Provider Note    Event Date/Time   First MD Initiated Contact with Patient 06/30/24 1614     (approximate)   History   Leg Swelling   HPI  Nathaniel Gamble is a 88 y.o. male  abdominal aortic aneurysm status post repair, diabetes type 2, seizures like activity, peripheral artery disease, stage IIIb chronic kidney disease, hypertensive chronic kidney disease, renal artery stenosis, secondary hyperparathyroidism of renal origin, on Eliquis  who presents to the emergency department with sudden onset left lower extremity pain.  Patient was doing well yesterday and woke up at 5:30 AM with pain in his left lower extremity.  Since then he has noticed increased swelling and color changes especially in his second toe.  He reports compliance with his Eliquis  and denies any trauma to the area.  He presents with his son and wife who helped contribute to the history.  Patient does have a history of intracranial hemorrhage 4 years ago      Physical Exam   Triage Vital Signs: ED Triage Vitals  Encounter Vitals Group     BP 06/30/24 1555 (!) 164/74     Girls Systolic BP Percentile --      Girls Diastolic BP Percentile --      Boys Systolic BP Percentile --      Boys Diastolic BP Percentile --      Pulse Rate 06/30/24 1555 95     Resp 06/30/24 1555 17     Temp 06/30/24 1555 (!) 100.8 F (38.2 C)     Temp src --      SpO2 06/30/24 1555 99 %     Weight 06/30/24 1556 150 lb (68 kg)     Height --      Head Circumference --      Peak Flow --      Pain Score 06/30/24 1555 7     Pain Loc --      Pain Education --      Exclude from Growth Chart --     Most recent vital signs: Vitals:   06/30/24 2044 06/30/24 2253  BP: 135/64 139/63  Pulse: 88   Resp: 16   Temp: 98.5 F (36.9 C)   SpO2: 93%     Nursing Triage Note reviewed. Vital signs reviewed and patients oxygen saturation is normoxic  General: Patient is well nourished, well developed, awake  and alert, Head: Normocephalic and atraumatic Eyes: Normal inspection, extraocular muscles intact, no conjunctival pallor Ear, nose, throat: Normal external exam Neck: Normal range of motion Respiratory: Patient is in no respiratory distress, lungs CTAB Cardiovascular: Patient is not tachycardic, RRR without murmur appreciated GI: Abd SNT with no guarding or rebound  Back: Normal inspection of the back with good strength and range of motion throughout all ext Extremities: Patient's left lower extremity is edematous.  He does have a dopplerable left DP pulse but second toe is ischemic with decreased cap refill.  Sensation is intact with 5/5 bilat UE/LE strength, no gross motor or sensory defects noted. Coordination appears to be adequate. Skin: Warm, dry, and intact Psych: normal mood and affect, no SI or HI  ED Results / Procedures / Treatments   Labs (all labs ordered are listed, but only abnormal results are displayed) Labs Reviewed  COMPREHENSIVE METABOLIC PANEL WITH GFR - Abnormal; Notable for the following components:      Result Value   Potassium 5.3 (*)    Glucose, Bld 119 (*)  BUN 45 (*)    Creatinine, Ser 2.31 (*)    GFR, Estimated 27 (*)    All other components within normal limits  CBC WITH DIFFERENTIAL/PLATELET - Abnormal; Notable for the following components:   Platelets 109 (*)    Abs Granulocyte 7.6 (*)    All other components within normal limits  URINALYSIS, W/ REFLEX TO CULTURE (INFECTION SUSPECTED) - Abnormal; Notable for the following components:   Color, Urine STRAW (*)    APPearance CLEAR (*)    Glucose, UA >=500 (*)    Protein, ur 100 (*)    Bacteria, UA RARE (*)    All other components within normal limits  PROTIME-INR - Abnormal; Notable for the following components:   Prothrombin Time 17.4 (*)    INR 1.4 (*)    All other components within normal limits  HEPARIN  LEVEL (UNFRACTIONATED) - Abnormal; Notable for the following components:   Heparin   Unfractionated >1.10 (*)    All other components within normal limits  LACTIC ACID, PLASMA  APTT  APTT  HEPARIN  LEVEL (UNFRACTIONATED)  CBC  CBC  HEMOGLOBIN A1C  BASIC METABOLIC PANEL WITH GFR     EKG None  RADIOLOGY CT head: No intracranial hemorrhage X-ray chest: No acute abnormalities Ultrasound of the lower extremity duplex:  1. Complete occlusion of the left superficial femoral artery in the  mid and distal thigh.  2. Greater than 60% diameter stenosis of the origins of the profunda  femoral artery.  3. Complete occlusion of the posterior tibial artery.      PROCEDURES:  Critical Care performed: Yes, see critical care procedure note(s)  .Critical Care  Performed by: Nicholaus Rolland BRAVO, MD Authorized by: Nicholaus Rolland BRAVO, MD   Critical care provider statement:    Critical care time (minutes):  30   Critical care was necessary to treat or prevent imminent or life-threatening deterioration of the following conditions:  Circulatory failure   Critical care was time spent personally by me on the following activities:  Development of treatment plan with patient or surrogate, discussions with consultants, evaluation of patient's response to treatment, examination of patient, ordering and review of laboratory studies, ordering and review of radiographic studies, ordering and performing treatments and interventions, pulse oximetry, re-evaluation of patient's condition and review of old charts   Care discussed with: admitting provider   Comments:     Need to discuss heparin  drip with pharmacy, vascular surgery and hospitalist management of heparin  drip laying against intracranial hemorrhage    MEDICATIONS ORDERED IN ED: Medications  heparin  ADULT infusion 100 units/mL (25000 units/250mL) (1,200 Units/hr Intravenous New Bag/Given 06/30/24 1812)  sodium chloride  flush (NS) 0.9 % injection 3 mL (3 mLs Intravenous Given 06/30/24 2253)  acetaminophen  (TYLENOL ) tablet 650 mg (has  no administration in time range)    Or  acetaminophen  (TYLENOL ) suppository 650 mg (has no administration in time range)  HYDROcodone -acetaminophen  (NORCO/VICODIN) 5-325 MG per tablet 1-2 tablet (has no administration in time range)  morphine  (PF) 2 MG/ML injection 1 mg (has no administration in time range)  traZODone  (DESYREL ) tablet 25 mg (has no administration in time range)  senna-docusate (Senokot-S) tablet 1 tablet (has no administration in time range)  bisacodyl  (DULCOLAX) EC tablet 5 mg (has no administration in time range)  ondansetron  (ZOFRAN ) tablet 4 mg (has no administration in time range)    Or  ondansetron  (ZOFRAN ) injection 4 mg (has no administration in time range)  hydrALAZINE  (APRESOLINE ) injection 5 mg (has no administration in  time range)  insulin  aspart (novoLOG ) injection 0-9 Units (has no administration in time range)  insulin  aspart (novoLOG ) injection 0-5 Units ( Subcutaneous Not Given 06/30/24 2228)  0.9 %  sodium chloride  infusion ( Intravenous New Bag/Given 06/30/24 2252)  aspirin  EC tablet 81 mg (has no administration in time range)  amLODipine  (NORVASC ) tablet 5 mg (has no administration in time range)  carvedilol  (COREG ) tablet 25 mg (has no administration in time range)  hydrALAZINE  (APRESOLINE ) tablet 10 mg (10 mg Oral Given 06/30/24 2253)  losartan  (COZAAR ) tablet 100 mg (has no administration in time range)  simvastatin  (ZOCOR ) tablet 40 mg (40 mg Oral Given 06/30/24 2253)  dapagliflozin  propanediol (FARXIGA ) tablet 10 mg (has no administration in time range)  cyanocobalamin  (VITAMIN B12) tablet 1,000 mcg (has no administration in time range)  multivitamin-lutein  (OCUVITE-LUTEIN ) capsule 1 capsule (has no administration in time range)  Isosource LIQD ( Oral Not Given 06/30/24 2308)  acetaminophen  (TYLENOL ) tablet 650 mg (650 mg Oral Given 06/30/24 1606)  sodium chloride  0.9 % bolus 1,000 mL (0 mLs Intravenous Stopped 06/30/24 1757)     IMPRESSION / MDM /  ASSESSMENT AND PLAN / ED COURSE                                Differential diagnosis includes, but is not limited to, ischemic leg, PVD, electrolyte derangement anemia, intracranial hemorrhage  ED course: Patient presents in given history initial concern for ischemic leg.  Patient does have a dopplerable pulse but given the toe plan was to get a CTA aorta with runoff.  Unfortunately patient's creatinine is greater than his baseline at 2.31 with AG of all of 27.  Given his renal history I do not think getting that 1 off would be rise.  Ultrasounds ordered instead which did demonstrate occlusion.  Case discussed with vascular surgery and plan to start heparin  drip.  Of note decided to do this without bolus given patient's taking his Eliquis  this morning and also his history of intracranial hemorrhage.  Will discuss case for admission with hospitalist   Clinical Course as of 07/01/24 0052  Sun Jun 30, 2024  1617 Temp(!): 100.8 F (38.2 C) Elevated on arrival [HD]  1632 Patient does have a dopplerable  DP  Pulse [HD]  1633 Creatinine(!): 2.31 This is worse than baseline we will give him some fluids [HD]  1634 Creatinine(!): 2.31 [HD]  1748 Per Ultrasound tech: so his SFA is occluded but probably not acute because he has collaterals formed with reconstitution of flow in the popliteal. PTA is occluded most likely and ATA is patent.  [HD]  1749 Vascular surgery paged [HD]  1751 Case discussed with Dr. Tisa of vascular surgery who states placed the patient on a heparin  drip.  Reviewed the indications benefits risk and alternatives to family at bedside and all voiced understanding and opted to proceed given his prior intracranial hemorrhage.  She will consult and asks that the hospitalist admits.  When asked talk to pharmacy [HD]  1755 Plan for no heparin  bolus with heparin  drip using the low protocol [HD]    Clinical Course User Index [HD] Nicholaus Rolland BRAVO, MD     FINAL CLINICAL IMPRESSION(S) /  ED DIAGNOSES   Final diagnoses:  Ischemic leg  Pain of left lower extremity  Acute renal insufficiency     Rx / DC Orders   ED Discharge Orders     None  Note:  This document was prepared using Dragon voice recognition software and may include unintentional dictation errors.   Nicholaus Rolland BRAVO, MD 07/01/24 [phone]

## 2024-06-30 NOTE — ED Notes (Signed)
Patient assisted with urinal   

## 2024-06-30 NOTE — ED Triage Notes (Signed)
 First nurse note: Arrived by [facility] From home. C/o bilateral feet/ankle swelling that started today.   Difficulty walking. Needs assistance since swelling started  EMS vitals: 90HR 96% RA 159/66 b/p 98.3oral

## 2024-06-30 NOTE — ED Triage Notes (Signed)
 Pt reports swelling, redness, and pain in the left lower extremity. Pt denies fevers, CP, or SOB.

## 2024-06-30 NOTE — ED Notes (Signed)
 ED Provider at bedside.

## 2024-06-30 NOTE — Progress Notes (Signed)
 PHARMACY - ANTICOAGULATION CONSULT NOTE  Pharmacy Consult for heparin  Indication: DVT  Allergies  Allergen Reactions   Oxycodone  Nausea Only   Lisinopril Swelling   Tetanus Toxoids Swelling    Patient Measurements: Height: 5' 10 (177.8 cm) Weight: 68 kg (149 lb 14.6 oz) IBW/kg (Calculated) : 73 HEPARIN  DW (KG): 68  Vital Signs: Temp: 100.8 F (38.2 C) (08/17 1555) BP: 164/74 (08/17 1555) Pulse Rate: 95 (08/17 1555)  Labs: Recent Labs    06/30/24 1600 06/30/24 1625  HGB 13.6  --   HCT 41.9  --   PLT 109*  --   APTT  --  32  LABPROT  --  17.4*  INR  --  1.4*  CREATININE 2.31*  --     Estimated Creatinine Clearance: 22.5 mL/min (A) (by C-G formula based on SCr of 2.31 mg/dL (H)).   Medical History: Past Medical History:  Diagnosis Date   Abdominal aortic aneurysm (HCC)    monitored by dr. bosie   Abdominal aortic aneurysm (HCC)    Cancer (HCC) 1976   melanoma, under right arm, removed   Chronic kidney disease 2011   stent right kidney   Coronary artery disease    Diabetes mellitus without complication (HCC)    Hypertension    Jaw fracture (HCC)    Myocardial infarction (HCC)    Peripheral vascular disease (HCC)    Renal artery stenosis (HCC)    Stroke [facility])    Assessment: 88 y/o male presenting with swelling, redness, and pain in the left lower extremity - found to have complete occlusion of left superficial femoral artery. PMH significant for abdominal aortic aneurysm, T2DM, atrial fibrillation on Eliquis , seizures, PAD, CKD III, HTN, stroke (2019, 2020). Pharmacy has been consulted to initiate and manage heparin  infusion.  Last dose of Eliquis : 5 mg on 06/30/24 in AM Baseline labs: hgb 13.6, plt 109, HL>1.10, aPTT 37, INR 1.4  Given recent use of apixaban , and elevated baseline heparin  level, will monitor with aPTT levels until correlating with heparin  levels.   Goal of Therapy:  No boluses Heparin  level 0.3-0.5 units/ml Monitor platelets by  anticoagulation protocol: Yes   Plan:  Per MD, will not give heparin  boluses Start heparin  infusion at 1200 units/hour Check aPTT level in 8 hours Monitor aPTT levels until correlating with heparin  levels, then can transition to monitoring with heparin  levels only Monitor CBC and signs/symptoms of bleeding  Thank you for involving pharmacy in this patient's care.   Damien Napoleon, PharmD Clinical Pharmacist 06/30/2024 5:59 PM

## 2024-06-30 NOTE — ED Notes (Signed)
Patient in Xray at this time.

## 2024-07-01 ENCOUNTER — Inpatient Hospital Stay

## 2024-07-01 DIAGNOSIS — M25472 Effusion, left ankle: Secondary | ICD-10-CM

## 2024-07-01 DIAGNOSIS — I10 Essential (primary) hypertension: Secondary | ICD-10-CM

## 2024-07-01 DIAGNOSIS — I998 Other disorder of circulatory system: Secondary | ICD-10-CM | POA: Diagnosis not present

## 2024-07-01 DIAGNOSIS — N179 Acute kidney failure, unspecified: Secondary | ICD-10-CM

## 2024-07-01 DIAGNOSIS — E785 Hyperlipidemia, unspecified: Secondary | ICD-10-CM

## 2024-07-01 DIAGNOSIS — N189 Chronic kidney disease, unspecified: Secondary | ICD-10-CM

## 2024-07-01 DIAGNOSIS — D696 Thrombocytopenia, unspecified: Secondary | ICD-10-CM

## 2024-07-01 DIAGNOSIS — Z8679 Personal history of other diseases of the circulatory system: Secondary | ICD-10-CM

## 2024-07-01 LAB — GLUCOSE, CAPILLARY
Glucose-Capillary: 91 mg/dL (ref 70–99)
Glucose-Capillary: 94 mg/dL (ref 70–99)
Glucose-Capillary: 98 mg/dL (ref 70–99)
Glucose-Capillary: 95 mg/dL (ref 70–99)

## 2024-07-01 LAB — APTT
aPTT: 130 s — ABNORMAL HIGH (ref 24–36)
aPTT: 126 s — ABNORMAL HIGH (ref 24–36)
aPTT: 108 s — ABNORMAL HIGH (ref 24–36)

## 2024-07-01 LAB — CBC
HCT: 36.5 % — ABNORMAL LOW (ref 39.0–52.0)
Hemoglobin: 12 g/dL — ABNORMAL LOW (ref 13.0–17.0)
MCH: 30.3 pg (ref 26.0–34.0)
MCHC: 32.9 g/dL (ref 30.0–36.0)
MCV: 92.2 fL (ref 80.0–100.0)
Platelets: 91 K/uL — ABNORMAL LOW (ref 150–400)
RBC: 3.96 MIL/uL — ABNORMAL LOW (ref 4.22–5.81)
RDW: 14.6 % (ref 11.5–15.5)
WBC: 10.6 K/uL — ABNORMAL HIGH (ref 4.0–10.5)
nRBC: 0 % (ref 0.0–0.2)

## 2024-07-01 LAB — BASIC METABOLIC PANEL WITH GFR
Anion gap: 8 (ref 5–15)
BUN: 36 mg/dL — ABNORMAL HIGH (ref 8–23)
CO2: 20 mmol/L — ABNORMAL LOW (ref 22–32)
Calcium: 8.7 mg/dL — ABNORMAL LOW (ref 8.9–10.3)
Chloride: 111 mmol/L (ref 98–111)
Creatinine, Ser: 1.87 mg/dL — ABNORMAL HIGH (ref 0.61–1.24)
GFR, Estimated: 35 mL/min — ABNORMAL LOW (ref >60)
Glucose, Bld: 115 mg/dL — ABNORMAL HIGH (ref 70–99)
Potassium: 4.7 mmol/L (ref 3.5–5.1)
Sodium: 139 mmol/L (ref 135–145)

## 2024-07-01 LAB — URIC ACID: Uric Acid, Serum: 7.1 mg/dL (ref 3.7–8.6)

## 2024-07-01 LAB — HEPARIN LEVEL (UNFRACTIONATED): Heparin Unfractionated: >1.1 [IU]/mL — ABNORMAL HIGH (ref 0.30–0.70)

## 2024-07-01 MED ORDER — ADULT MULTIVITAMIN W/MINERALS CH
1.0000 | ORAL_TABLET | Freq: Every day | ORAL | Status: DC
  Administered 2024-07-01 – 2024-07-05 (×5): 1 via ORAL
  Filled 2024-07-01 (×5): qty 1

## 2024-07-01 MED ORDER — ENSURE PLUS HIGH PROTEIN PO LIQD
237.0000 mL | Freq: Two times a day (BID) | ORAL | Status: DC
  Administered 2024-07-02 – 2024-07-05 (×4): 237 mL via ORAL

## 2024-07-01 MED ORDER — HEPARIN (PORCINE) 25000 UT/250ML-% IV SOLN
959.0000 [IU]/h | INTRAVENOUS | Status: DC
  Administered 2024-07-01: 950 [IU]/h via INTRAVENOUS

## 2024-07-01 MED ORDER — HEPARIN (PORCINE) 25000 UT/250ML-% IV SOLN
950.0000 [IU]/h | INTRAVENOUS | Status: DC
  Administered 2024-07-01: 750 [IU]/h via INTRAVENOUS
  Filled 2024-07-01 (×2): qty 250

## 2024-07-01 NOTE — Progress Notes (Signed)
 PHARMACY - ANTICOAGULATION CONSULT NOTE  Pharmacy Consult for heparin  Indication: DVT  Allergies  Allergen Reactions   Oxycodone  Nausea Only   Lisinopril Swelling   Tetanus Toxoids Swelling    Patient Measurements: Height: 5' 10 (177.8 cm) Weight: 68 kg (149 lb 14.6 oz) IBW/kg (Calculated) : 73 HEPARIN  DW (KG): 68  Vital Signs: Temp: 98.1 F (36.7 C) (08/18 2017) Temp Source: Oral (08/18 2017) BP: 118/55 (08/18 2022) Pulse Rate: 57 (08/18 2022)  Labs: Recent Labs    06/30/24 1600 06/30/24 1625 06/30/24 1625 07/01/24 0232 07/01/24 1308 07/01/24 2306  HGB 13.6  --   --  12.0*  --   --   HCT 41.9  --   --  36.5*  --   --   PLT 109*  --   --  91*  --   --   APTT  --  32   < > 130* 126* 108*  LABPROT  --  17.4*  --   --   --   --   INR  --  1.4*  --   --   --   --   HEPARINUNFRC  --  >1.10*  --  >1.10*  --   --   CREATININE 2.31*  --   --  1.87*  --   --    < > = values in this interval not displayed.    Estimated Creatinine Clearance: 27.8 mL/min (A) (by C-G formula based on SCr of 1.87 mg/dL (H)).   Medical History: Past Medical History:  Diagnosis Date   Abdominal aortic aneurysm (HCC)    monitored by dr. bosie   Abdominal aortic aneurysm (HCC)    Cancer (HCC) 1976   melanoma, under right arm, removed   Chronic kidney disease 2011   stent right kidney   Coronary artery disease    Diabetes mellitus without complication (HCC)    Hypertension    Jaw fracture (HCC)    Myocardial infarction (HCC)    Peripheral vascular disease (HCC)    Renal artery stenosis (HCC)    Stroke [facility])    Assessment: 88 y/o male presenting with swelling, redness, and pain in the left lower extremity - found to have complete occlusion of left superficial femoral artery. PMH significant for abdominal aortic aneurysm, T2DM, atrial fibrillation on Eliquis , seizures, PAD, CKD III, HTN, stroke (2019, 2020). Pharmacy has been consulted to initiate and manage heparin  infusion.  Last  dose of Eliquis : 5 mg on 06/30/24 in AM Baseline labs: hgb 13.6, plt 109, HL>1.10, aPTT 37, INR 1.4  Given recent use of apixaban , and elevated baseline heparin  level, will monitor with aPTT levels until correlating with heparin  levels.   8/18 @ 0232:  aPTT = 130,  HL = > 1.10 8/18 @ 1308: aPTT= 126 8/18 2306 aPTT = 108, supratherapeutic  Goal of Therapy:  No boluses Heparin  level 0.3-0.5 units/ml Monitor platelets by anticoagulation protocol: Yes   Plan:  Per MD, will not give heparin  boluses  - Will decrease heparin  infusion to 600 units/hr - Recheck aPTT 8 hrs after rate change - Recheck HL daily  - Monitor aPTT levels until correlating with heparin  levels, then can transition to monitoring with heparin  levels only - Monitor CBC and signs/symptoms of bleeding  Thank you for involving pharmacy in this patient's care.   Rankin CANDIE Dills, PharmD, [facility] 07/01/2024 11:42 PM

## 2024-07-01 NOTE — Assessment & Plan Note (Signed)
 Hepatitis C negative.  Seems chronic.

## 2024-07-01 NOTE — Plan of Care (Signed)
  Problem: Education: Goal: Knowledge of General Education information will improve Description: Including pain rating scale, medication(s)/side effects and non-pharmacologic comfort measures Outcome: Progressing   Problem: Clinical Measurements: Goal: Ability to maintain clinical measurements within normal limits will improve Outcome: Progressing   Problem: Clinical Measurements: Goal: Will remain free from infection Outcome: Progressing   Problem: Activity: Goal: Risk for activity intolerance will decrease Outcome: Progressing   Problem: Nutrition: Goal: Adequate nutrition will be maintained Outcome: Progressing   Problem: Elimination: Goal: Will not experience complications related to bowel motility Outcome: Progressing   Problem: Pain Managment: Goal: General experience of comfort will improve and/or be controlled Outcome: Progressing   Problem: Safety: Goal: Ability to remain free from injury will improve Outcome: Progressing   Problem: Skin Integrity: Goal: Risk for impaired skin integrity will decrease Outcome: Progressing   Problem: Fluid Volume: Goal: Ability to maintain a balanced intake and output will improve Outcome: Progressing

## 2024-07-01 NOTE — Assessment & Plan Note (Signed)
 On amlodipine  Coreg  and hydralazine .  Holding losartan 

## 2024-07-01 NOTE — Progress Notes (Signed)
 Mobility Specialist - Progress Note   07/01/24 1437  Mobility  Activity Dangled on edge of bed;Stood at bedside;Pivoted/transferred from bed to chair  Level of Assistance Contact guard assist, steadying assist  Assistive Device Front wheel walker  Distance Ambulated (ft) 4 ft  Activity Response Tolerated well  Mobility visit 1 Mobility  Mobility Specialist Start Time (ACUTE ONLY) 1412  Mobility Specialist Stop Time (ACUTE ONLY) 1427  Mobility Specialist Time Calculation (min) (ACUTE ONLY) 15 min   Pt supine upon entry, utilizing RA. Pt requesting to use the urinal and transfer to the recliner this date. Pt completed bed mob MinA to bring trunk from sup to sit, extra time required to bring BLE EOB. Pt expressed back pain throughout entirety of session-- educated on log roll technique to exit/return to bed. Pt STS to RW MinA-CGA, stood at bedside and utilized the urinal with CGA. Pt transferred to the recliner via SPT CGA. Pt left seated in the recliner with needs within reach. Family member/friend present at bedside.  America Silvan Mobility Specialist 07/01/24 2:48 PM

## 2024-07-01 NOTE — Progress Notes (Signed)
 PHARMACY - ANTICOAGULATION CONSULT NOTE  Pharmacy Consult for heparin  Indication: DVT  Allergies  Allergen Reactions   Oxycodone  Nausea Only   Lisinopril Swelling   Tetanus Toxoids Swelling    Patient Measurements: Height: 5' 10 (177.8 cm) Weight: 68 kg (149 lb 14.6 oz) IBW/kg (Calculated) : 73 HEPARIN  DW (KG): 68  Vital Signs: Temp: 98.5 F (36.9 C) (08/17 2044) Temp Source: Oral (08/17 2017) BP: 139/63 (08/17 2253) Pulse Rate: 88 (08/17 2044)  Labs: Recent Labs    06/30/24 1600 06/30/24 1625 07/01/24 0232  HGB 13.6  --  12.0*  HCT 41.9  --  36.5*  PLT 109*  --  91*  APTT  --  32 130*  LABPROT  --  17.4*  --   INR  --  1.4*  --   HEPARINUNFRC  --  >1.10* >1.10*  CREATININE 2.31*  --  1.87*    Estimated Creatinine Clearance: 27.8 mL/min (A) (by C-G formula based on SCr of 1.87 mg/dL (H)).   Medical History: Past Medical History:  Diagnosis Date   Abdominal aortic aneurysm (HCC)    monitored by dr. bosie   Abdominal aortic aneurysm (HCC)    Cancer (HCC) 1976   melanoma, under right arm, removed   Chronic kidney disease 2011   stent right kidney   Coronary artery disease    Diabetes mellitus without complication (HCC)    Hypertension    Jaw fracture (HCC)    Myocardial infarction (HCC)    Peripheral vascular disease (HCC)    Renal artery stenosis (HCC)    Stroke [facility])    Assessment: 88 y/o male presenting with swelling, redness, and pain in the left lower extremity - found to have complete occlusion of left superficial femoral artery. PMH significant for abdominal aortic aneurysm, T2DM, atrial fibrillation on Eliquis , seizures, PAD, CKD III, HTN, stroke (2019, 2020). Pharmacy has been consulted to initiate and manage heparin  infusion.  Last dose of Eliquis : 5 mg on 06/30/24 in AM Baseline labs: hgb 13.6, plt 109, HL>1.10, aPTT 37, INR 1.4  Given recent use of apixaban , and elevated baseline heparin  level, will monitor with aPTT levels until  correlating with heparin  levels.   Goal of Therapy:  No boluses Heparin  level 0.3-0.5 units/ml Monitor platelets by anticoagulation protocol: Yes   Plan:  Per MD, will not give heparin  boluses  8/18 @ 0232:  aPTT = 130,  HL = > 1.10 - will hold heparin  infusion for 30 min then restart @ 950 units/hr - recheck aPTT 8 hrs after restart - recheck HL on 8/19 with AM labs - Monitor aPTT levels until correlating with heparin  levels, then can transition to monitoring with heparin  levels only Monitor CBC and signs/symptoms of bleeding  Thank you for involving pharmacy in this patient's care.   Katilynn Sinkler D Clinical Pharmacist 07/01/2024 3:38 AM

## 2024-07-01 NOTE — Progress Notes (Signed)
 PHARMACY - ANTICOAGULATION CONSULT NOTE  Pharmacy Consult for heparin  Indication: DVT  Allergies  Allergen Reactions   Oxycodone  Nausea Only   Lisinopril Swelling   Tetanus Toxoids Swelling    Patient Measurements: Height: 5' 10 (177.8 cm) Weight: 68 kg (149 lb 14.6 oz) IBW/kg (Calculated) : 73 HEPARIN  DW (KG): 68  Vital Signs: Temp: 98 F (36.7 C) (08/18 0824) BP: 104/82 (08/18 0824) Pulse Rate: 69 (08/18 0824)  Labs: Recent Labs    06/30/24 1600 06/30/24 1625 07/01/24 0232 07/01/24 1308  HGB 13.6  --  12.0*  --   HCT 41.9  --  36.5*  --   PLT 109*  --  91*  --   APTT  --  32 130* 126*  LABPROT  --  17.4*  --   --   INR  --  1.4*  --   --   HEPARINUNFRC  --  >1.10* >1.10*  --   CREATININE 2.31*  --  1.87*  --     Estimated Creatinine Clearance: 27.8 mL/min (A) (by C-G formula based on SCr of 1.87 mg/dL (H)).   Medical History: Past Medical History:  Diagnosis Date   Abdominal aortic aneurysm (HCC)    monitored by dr. bosie   Abdominal aortic aneurysm (HCC)    Cancer (HCC) 1976   melanoma, under right arm, removed   Chronic kidney disease 2011   stent right kidney   Coronary artery disease    Diabetes mellitus without complication (HCC)    Hypertension    Jaw fracture (HCC)    Myocardial infarction (HCC)    Peripheral vascular disease (HCC)    Renal artery stenosis (HCC)    Stroke [facility])    Assessment: 88 y/o male presenting with swelling, redness, and pain in the left lower extremity - found to have complete occlusion of left superficial femoral artery. PMH significant for abdominal aortic aneurysm, T2DM, atrial fibrillation on Eliquis , seizures, PAD, CKD III, HTN, stroke (2019, 2020). Pharmacy has been consulted to initiate and manage heparin  infusion.  Last dose of Eliquis : 5 mg on 06/30/24 in AM Baseline labs: hgb 13.6, plt 109, HL>1.10, aPTT 37, INR 1.4  Given recent use of apixaban , and elevated baseline heparin  level, will monitor with aPTT  levels until correlating with heparin  levels.   8/18 @ 0232:  aPTT = 130,  HL = > 1.10 8/18 @ 1308: aPTT= 126  Goal of Therapy:  No boluses Heparin  level 0.3-0.5 units/ml Monitor platelets by anticoagulation protocol: Yes   Plan:  Per MD, will not give heparin  boluses  8/18 @ 1308: aPTT= 126 - will hold heparin  infusion for 30 min then restart @ 750 units/hr - recheck aPTT 8 hrs after restart - recheck HL on 8/19 with AM labs - Monitor aPTT levels until correlating with heparin  levels, then can transition to monitoring with heparin  levels only - Monitor CBC and signs/symptoms of bleeding  Thank you for involving pharmacy in this patient's care.   Lum VEAR Mania, PharmD, BCPS Clinical Pharmacist 07/01/2024 1:52 PM

## 2024-07-01 NOTE — Plan of Care (Signed)

## 2024-07-01 NOTE — Assessment & Plan Note (Addendum)
 Continue to monitor.  X-ray negative.  Uric acid normal range.

## 2024-07-01 NOTE — Assessment & Plan Note (Signed)
 On simvastatin .

## 2024-07-01 NOTE — Hospital Course (Signed)
 88 y.o. male with a known history of abdominal aortic aneurysm, melanoma, CKD 3B with secondary hyperparathyroidism, CAD status post MI and CVA, diabetes, hypertension, intracranial hemorrhage 4 years ago, peripheral vascular disease on Eliquis , renal artery stenosis presents to the emergency department for evaluation of lower extremity swelling.  Patient was in a usual state of health until this morning at 5:30 AM he woke up with sudden onset of severe left lower extremity pain and swelling, noticed discoloration of his left second toe.  Patient reports that his pain is significantly improved   Patient denies fevers/chills, weakness, dizziness, chest pain, shortness of breath, N/V/C/D, abdominal pain, dysuria/frequency, changes in mental status.    Otherwise there has been no change in status. Patient has been taking medication as prescribed and there has been no recent change in medication or diet.  No recent antibiotics.  There has been no recent illness, hospitalizations, travel or sick contacts.     EMS/ED Course: Patient received Tylenol , normal saline, heparin  infusion without bolus. Medical admission has been requested for further management of ischemic limb.  8/18.  Patient states his foot was red yesterday but not red today.  Patient feels okay.  He states that his foot was on a pillow and slid off and landed on the ground.  Patient was brought in with swelling of his ankle.

## 2024-07-01 NOTE — Assessment & Plan Note (Signed)
 On aspirin

## 2024-07-01 NOTE — Assessment & Plan Note (Addendum)
 AKI on CKD stage IIIb.  Creatinine 2.31 on presentation down to 1.87.  Patient on Farxiga .

## 2024-07-01 NOTE — Progress Notes (Incomplete)
 Progress Note    07/01/2024 5:10 PM * No surgery found *  Subjective:  Nathaniel Gamble is an 88 yo male abdominal aortic aneurysm status post repair, diabetes type 2, seizures like activity, peripheral artery disease, stage IIIb chronic kidney disease, hypertensive chronic kidney disease, renal artery stenosis, secondary hyperparathyroidism of renal origin, on Eliquis  who presents to the emergency department with sudden onset left lower extremity pain.  Patient was doing well yesterday and woke up at 5:30 AM with pain in his left lower extremity.  Since then he has noticed increased swelling and color changes especially in his second toe.  He reports compliance with his Eliquis    On my evaluation the patient's toes and forefoot are erythematous however they are nontender. He has some superficial abrasions over the second toe. He states that according to his wife his toes always look that way and have not changed significantly from the past. Non ABI arterial duplex revealed chronic short segment SFA occlusion, profunda femoris artery stenosis, posterior tibial artery occlusion with patent ATA and DP. Likely all chronic findings.    Vitals:   07/01/24 0824 07/01/24 1659  BP: 104/82 (!) 137/52  Pulse: 69 (!) 57  Resp: 18 16  Temp: 98 F (36.7 C) 98.6 F (37 C)  SpO2: 96% (!) 83%   Physical Exam: Cardiac:  RRR, normal S1 and S2.  No murmurs. Lungs: Rhonchorous throughout and diminished in the bases.  Nonlabored breathing. Incisions: None Extremities: Bilateral lower extremities warm to touch.  Left lower extremity swollen ankle and foot. Abdomen: Positive bowel sounds throughout, soft, nontender nondistended. Neurologic: Alert and oriented x 3, answers all questions follows commands appropriately.  CBC    Component Value Date/Time   WBC 10.6 (H) 07/01/2024 0232   RBC 3.96 (L) 07/01/2024 0232   HGB 12.0 (L) 07/01/2024 0232   HCT 36.5 (L) 07/01/2024 0232   PLT 91 (L) 07/01/2024 0232    MCV 92.2 07/01/2024 0232   MCH 30.3 07/01/2024 0232   MCHC 32.9 07/01/2024 0232   RDW 14.6 07/01/2024 0232   LYMPHSABS 1.2 06/30/2024 1600   MONOABS 0.9 06/30/2024 1600   EOSABS 0.1 06/30/2024 1600   BASOSABS 0.0 06/30/2024 1600   BASOSABS 0 05/14/2012 1000    BMET    Component Value Date/Time   NA 139 07/01/2024 0232   K 4.7 07/01/2024 0232   CL 111 07/01/2024 0232   CO2 20 (L) 07/01/2024 0232   GLUCOSE 115 (H) 07/01/2024 0232   BUN 36 (H) 07/01/2024 0232   CREATININE 1.87 (H) 07/01/2024 0232   CALCIUM  8.7 (L) 07/01/2024 0232   GFRNONAA 35 (L) 07/01/2024 0232   GFRAA 42 (L) 06/07/2020 1929    INR    Component Value Date/Time   INR 1.4 (H) 06/30/2024 1625     Intake/Output Summary (Last 24 hours) at 07/01/2024 1710 Last data filed at 07/01/2024 1627 Gross per 24 hour  Intake 1184.11 ml  Output 1225 ml  Net -40.89 ml     Assessment/Plan:  88 y.o. male who presents yesterday to [facility] emergency department with extreme left lower extremity pain.  Upon workup patient underwent arterial duplex ultrasounds with ABIs.  This shows chronic short segment SFA occlusion, profunda femoris artery stenosis, posterior tibial artery occlusion with patent ATA and DP.  Patient's ABIs bilaterally were 0.59 and 0.60 to his lower extremities.* No surgery found *   Therefore vascular surgery recommends patient undergo angiogram with possible intervention of the left lower extremity.  Vascular  surgery plans on taking the patient to the vascular lab on Friday, 07/05/2024 for left lower extremity angiogram with possible intervention.  I discussed in detail at the bedside this morning with the patient the procedure, benefits, risk, complications.  Patient verbalizes understanding wishes to proceed.  Patient was made n.p.o. after midnight day of the procedure.  Patient is currently on a heparin  infusion.  Patient's most recent BUN and creatinine are 44/1.80 with a GFR of 36.  I discussed the case  in detail with Dr. Selinda Gu MD and he agrees with the plan.  DVT prophylaxis: Heparin  infusion   [name] [facility] 07/01/2024 5:10 PM

## 2024-07-01 NOTE — Assessment & Plan Note (Signed)
 Eliquis  switched over to heparin  drip.  Patient on aspirin .  Vascular surgery to do angiogram on Thursday or Friday.  Vascular ultrasound showed complete occlusion of left superficial femoral artery and complete occlusion of the posterior tibial artery.  ABI low at 0.6 left leg and 0.59 right leg.

## 2024-07-01 NOTE — Progress Notes (Signed)
 Progress Note   Patient: Nathaniel Gamble FMW:[id number] DOB: 1936/02/15 DOA: 06/30/2024     1 DOS: the patient was seen and examined on 07/01/2024   Brief hospital course: 88 y.o. male with a known history of abdominal aortic aneurysm, melanoma, CKD 3B with secondary hyperparathyroidism, CAD status post MI and CVA, diabetes, hypertension, intracranial hemorrhage 4 years ago, peripheral vascular disease on Eliquis , renal artery stenosis presents to the emergency department for evaluation of lower extremity swelling.  Patient was in a usual state of health until this morning at 5:30 AM he woke up with sudden onset of severe left lower extremity pain and swelling, noticed discoloration of his left second toe.  Patient reports that his pain is significantly improved   Patient denies fevers/chills, weakness, dizziness, chest pain, shortness of breath, N/V/C/D, abdominal pain, dysuria/frequency, changes in mental status.    Otherwise there has been no change in status. Patient has been taking medication as prescribed and there has been no recent change in medication or diet.  No recent antibiotics.  There has been no recent illness, hospitalizations, travel or sick contacts.     EMS/ED Course: Patient received Tylenol , normal saline, heparin  infusion without bolus. Medical admission has been requested for further management of ischemic limb.  8/18.  Patient states his foot was red yesterday but not red today.  Patient feels okay.  He states that his foot was on a pillow and slid off and landed on the ground.  Patient was brought in with swelling of his ankle.  Assessment and Plan: * Ischemia of left lower extremity Eliquis  switched over to heparin  drip.  Patient on aspirin .  Vascular surgery ordered ABI.  They will consider angiogram if we can get kidney function better.  Vascular ultrasound showed complete occlusion of left superficial femoral artery and complete occlusion of the posterior tibial  artery  Left ankle swelling Continue to monitor.  X-ray negative.  Uric acid normal range.  Acute kidney injury superimposed on CKD (HCC) AKI on CKD stage IIIb.  Creatinine 2.31 on presentation down to 1.87.  Patient on Farxiga .  Essential hypertension On amlodipine  Coreg  and hydralazine .  Holding losartan   History of CAD (coronary artery disease) On aspirin   Hyperlipidemia, unspecified On simvastatin   Thrombocytopenia (HCC) Check hepatitis C tomorrow.  Seems chronic.        Subjective: Patient came in with left ankle swelling and left leg pain and had discoloration of his toe.  Pain has improved.  Physical Exam: Vitals:   06/30/24 2044 06/30/24 2253 07/01/24 0445 07/01/24 0824  BP: 135/64 139/63 119/82 104/82  Pulse: 88  76 69  Resp: 16   18  Temp: 98.5 F (36.9 C)  98.9 F (37.2 C) 98 F (36.7 C)  TempSrc:      SpO2: 93%  100% 96%  Weight:      Height:       Physical Exam HENT:     Head: Normocephalic.  Eyes:     General: Lids are normal.  Cardiovascular:     Rate and Rhythm: Normal rate and regular rhythm.     Heart sounds: S1 normal and S2 normal. Murmur heard.     Systolic murmur is present with a grade of 2/6.  Pulmonary:     Breath sounds: No decreased breath sounds, wheezing, rhonchi or rales.  Abdominal:     Palpations: Abdomen is soft.     Tenderness: There is no abdominal tenderness.  Musculoskeletal:     Right ankle:  No swelling.     Left ankle: Swelling present.  Skin:    General: Skin is warm.     Findings: No rash.  Neurological:     Mental Status: He is alert and oriented to person, place, and time.     Data Reviewed: Creatinine 1.87, uric acid 7.1 potassium 4.7, hemoglobin 10.6, platelet count 91, hemoglobin 12  Family Communication: Declined  Disposition: Status is: Inpatient Remains inpatient appropriate because: Vascular team working up for ischemia left foot.  Patient on heparin  drip.  Planned Discharge Destination:  Home    Time spent: 28 minutes  Author: Charlie Patterson, MD 07/01/2024 1:15 PM  For on call review [email or web address].

## 2024-07-02 DIAGNOSIS — M25472 Effusion, left ankle: Secondary | ICD-10-CM | POA: Diagnosis not present

## 2024-07-02 DIAGNOSIS — N179 Acute kidney failure, unspecified: Secondary | ICD-10-CM | POA: Diagnosis not present

## 2024-07-02 DIAGNOSIS — I10 Essential (primary) hypertension: Secondary | ICD-10-CM | POA: Diagnosis not present

## 2024-07-02 DIAGNOSIS — I998 Other disorder of circulatory system: Secondary | ICD-10-CM | POA: Diagnosis not present

## 2024-07-02 LAB — CBC
HCT: 33.7 % — ABNORMAL LOW (ref 39.0–52.0)
Hemoglobin: 11.2 g/dL — ABNORMAL LOW (ref 13.0–17.0)
MCH: 30.7 pg (ref 26.0–34.0)
MCHC: 33.2 g/dL (ref 30.0–36.0)
MCV: 92.3 fL (ref 80.0–100.0)
Platelets: 79 K/uL — ABNORMAL LOW (ref 150–400)
RBC: 3.65 MIL/uL — ABNORMAL LOW (ref 4.22–5.81)
RDW: 14.8 % (ref 11.5–15.5)
WBC: 5.6 K/uL (ref 4.0–10.5)
nRBC: 0 % (ref 0.0–0.2)

## 2024-07-02 LAB — HEMOGLOBIN A1C
Hgb A1c MFr Bld: 4.9 % (ref 4.8–5.6)
Mean Plasma Glucose: 94 mg/dL

## 2024-07-02 LAB — BASIC METABOLIC PANEL WITH GFR
Anion gap: 6 (ref 5–15)
BUN: 38 mg/dL — ABNORMAL HIGH (ref 8–23)
CO2: 20 mmol/L — ABNORMAL LOW (ref 22–32)
Calcium: 8.5 mg/dL — ABNORMAL LOW (ref 8.9–10.3)
Chloride: 110 mmol/L (ref 98–111)
Creatinine, Ser: 1.74 mg/dL — ABNORMAL HIGH (ref 0.61–1.24)
GFR, Estimated: 38 mL/min — ABNORMAL LOW (ref >60)
Glucose, Bld: 89 mg/dL (ref 70–99)
Potassium: 4.5 mmol/L (ref 3.5–5.1)
Sodium: 136 mmol/L (ref 135–145)

## 2024-07-02 LAB — GLUCOSE, CAPILLARY
Glucose-Capillary: 99 mg/dL (ref 70–99)
Glucose-Capillary: 124 mg/dL — ABNORMAL HIGH (ref 70–99)
Glucose-Capillary: 121 mg/dL — ABNORMAL HIGH (ref 70–99)
Glucose-Capillary: 112 mg/dL — ABNORMAL HIGH (ref 70–99)

## 2024-07-02 LAB — APTT
aPTT: 67 s — ABNORMAL HIGH (ref 24–36)
aPTT: 59 s — ABNORMAL HIGH (ref 24–36)

## 2024-07-02 LAB — HEPARIN LEVEL (UNFRACTIONATED): Heparin Unfractionated: 1.02 [IU]/mL — ABNORMAL HIGH (ref 0.30–0.70)

## 2024-07-02 LAB — HEPATITIS C ANTIBODY: HCV Ab: NONREACTIVE

## 2024-07-02 MED ORDER — SODIUM CHLORIDE 0.9 % IV SOLN: INTRAVENOUS | Status: DC

## 2024-07-02 NOTE — Progress Notes (Signed)
 Progress Note   Patient: Nathaniel Gamble FMW:[id number] DOB: 1936-03-29 DOA: 06/30/2024     2 DOS: the patient was seen and examined on 07/02/2024   Brief hospital course: 88 y.o. male with a known history of abdominal aortic aneurysm, melanoma, CKD 3B with secondary hyperparathyroidism, CAD status post MI and CVA, diabetes, hypertension, intracranial hemorrhage 4 years ago, peripheral vascular disease on Eliquis , renal artery stenosis presents to the emergency department for evaluation of lower extremity swelling.  Patient was in a usual state of health until this morning at 5:30 AM he woke up with sudden onset of severe left lower extremity pain and swelling, noticed discoloration of his left second toe.  Patient reports that his pain is significantly improved   Patient denies fevers/chills, weakness, dizziness, chest pain, shortness of breath, N/V/C/D, abdominal pain, dysuria/frequency, changes in mental status.    Otherwise there has been no change in status. Patient has been taking medication as prescribed and there has been no recent change in medication or diet.  No recent antibiotics.  There has been no recent illness, hospitalizations, travel or sick contacts.     EMS/ED Course: Patient received Tylenol , normal saline, heparin  infusion without bolus. Medical admission has been requested for further management of ischemic limb.  8/18.  Patient states his foot was red yesterday but not red today.  Patient feels okay.  He states that his foot was on a pillow and slid off and landed on the ground.  Patient was brought in with swelling of his ankle.  Arterial ultrasound shows a complete occlusion of the left superficial femoral artery and complete occlusion of the posterior tibial artery. 8/19.  Case discussed with vascular surgery and they will plan on doing an angiogram either on Thursday or Friday.  Evidence of moderate bilateral lower extremity peripheral vascular disease with resting  ankle-brachial index as of 0.59 on the right and 0.6 on the left.  Assessment and Plan: * Ischemia of left lower extremity Eliquis  switched over to heparin  drip.  Patient on aspirin .  Vascular surgery to do angiogram on Thursday or Friday.  Vascular ultrasound showed complete occlusion of left superficial femoral artery and complete occlusion of the posterior tibial artery.  ABI low at 0.6 left leg and 0.59 right leg.  Left ankle swelling Continue to monitor.  X-ray negative.  Uric acid normal range.  Acute kidney injury superimposed on CKD (HCC) AKI on CKD stage IIIb.  Creatinine 2.31 on presentation down to 1.74.  Patient on Farxiga .  Essential hypertension On amlodipine  Coreg  and hydralazine .  Holding losartan   History of CAD (coronary artery disease) On aspirin   Hyperlipidemia, unspecified On simvastatin   Thrombocytopenia (HCC) Hepatitis C negative.  Seems chronic.        Subjective: Patient feeling better than when he came in.  Toe is not discolored.  Not having any pain.  Still has some swelling in his ankle.  Physical Exam: Vitals:   07/01/24 2017 07/01/24 2022 07/02/24 0408 07/02/24 0727  BP: (!) 148/84 (!) 118/55 (!) 125/55 (!) 116/53  Pulse: (!) 112 (!) 57 (!) 51 (!) 56  Resp: 20 18 16 18   Temp: 98.1 F (36.7 C)  98 F (36.7 C) 98 F (36.7 C)  TempSrc: Oral  Oral   SpO2: 97% 96% 95% 98%  Weight:      Height:       Physical Exam HENT:     Head: Normocephalic.  Eyes:     General: Lids are normal.  Cardiovascular:  Rate and Rhythm: Normal rate and regular rhythm.     Heart sounds: S1 normal and S2 normal. Murmur heard.     Systolic murmur is present with a grade of 2/6.  Pulmonary:     Breath sounds: No decreased breath sounds, wheezing, rhonchi or rales.  Abdominal:     Palpations: Abdomen is soft.     Tenderness: There is no abdominal tenderness.  Musculoskeletal:     Right ankle: No swelling.     Left ankle: Swelling present.  Skin:     General: Skin is warm.     Findings: No rash.  Neurological:     Mental Status: He is alert and oriented to person, place, and time.     Data Reviewed: Creatinine 1.74, white blood count 5.6, hemoglobin 11.2, platelet count 79  Family Communication: Updated son on the phone  Disposition: Status is: Inpatient Remains inpatient appropriate because: Vascular team planning on doing an angiogram on Thursday or Friday  Planned Discharge Destination: Home    Time spent: 27 minutes Case discussed with vascular.  Author: Charlie Patterson, MD 07/02/2024 2:35 PM  For on call review [email or web address].

## 2024-07-02 NOTE — Progress Notes (Signed)
 PHARMACY - ANTICOAGULATION CONSULT NOTE  Pharmacy Consult for heparin  Indication: DVT  Allergies  Allergen Reactions   Oxycodone  Nausea Only   Lisinopril Swelling   Tetanus Toxoids Swelling    Patient Measurements: Height: 5' 10 (177.8 cm) Weight: 68 kg (149 lb 14.6 oz) IBW/kg (Calculated) : 73 HEPARIN  DW (KG): 68  Vital Signs: Temp: 97.8 F (36.6 C) (08/19 1459) Temp Source: Oral (08/19 1459) BP: 141/57 (08/19 1459) Pulse Rate: 92 (08/19 1459)  Labs: Recent Labs    06/30/24 1600 06/30/24 1600 06/30/24 1625 07/01/24 0232 07/01/24 1308 07/01/24 2306 07/02/24 0451 07/02/24 0826 07/02/24 1617  HGB 13.6  --   --  12.0*  --   --  11.2*  --   --   HCT 41.9  --   --  36.5*  --   --  33.7*  --   --   PLT 109*  --   --  91*  --   --  79*  --   --   APTT  --    < > 32 130*   < > 108*  --  67* 59*  LABPROT  --   --  17.4*  --   --   --   --   --   --   INR  --   --  1.4*  --   --   --   --   --   --   HEPARINUNFRC  --   --  >1.10* >1.10*  --   --   --  1.02*  --   CREATININE 2.31*  --   --  1.87*  --   --  1.74*  --   --    < > = values in this interval not displayed.    Estimated Creatinine Clearance: 29.9 mL/min (A) (by C-G formula based on SCr of 1.74 mg/dL (H)).   Medical History: Past Medical History:  Diagnosis Date   Abdominal aortic aneurysm (HCC)    monitored by dr. bosie   Abdominal aortic aneurysm (HCC)    Cancer (HCC) 1976   melanoma, under right arm, removed   Chronic kidney disease 2011   stent right kidney   Coronary artery disease    Diabetes mellitus without complication (HCC)    Hypertension    Jaw fracture (HCC)    Myocardial infarction (HCC)    Peripheral vascular disease (HCC)    Renal artery stenosis (HCC)    Stroke [facility])    Assessment: 88 y/o male presenting with swelling, redness, and pain in the left lower extremity - found to have complete occlusion of left superficial femoral artery. PMH significant for abdominal aortic aneurysm,  T2DM, atrial fibrillation on Eliquis , seizures, PAD, CKD III, HTN, stroke (2019, 2020). Pharmacy has been consulted to initiate and manage heparin  infusion.  Last dose of Eliquis : 5 mg on 06/30/24 in AM Baseline labs: hgb 13.6, plt 109, HL>1.10, aPTT 37, INR 1.4  Given recent use of apixaban , and elevated baseline heparin  level, will monitor with aPTT levels until correlating with heparin  levels.   8/18 @ 0232:  aPTT = 130,  HL = > 1.10 8/18 @ 1308: aPTT= 126 8/18 2306 aPTT = 108, supratherapeutic 8/19 0826 aPTT = 67, therapeutic x 1, not correlating with HL 1.02 8/19 1617 aPTT = 59, subtherapeutic  Goal of Therapy:  No boluses Heparin  level 0.3-0.5 units/ml aPTT 66-85 sec Monitor platelets by anticoagulation protocol: Yes   Plan:  Per MD, will not  give heparin  boluses 8/19@1617 : aPTT 59, subtherapeutic@600  units/hr  - Increase heparin  infusion to 650 units/hr - Recheck aPTT in 8 hrs after rate change - Recheck HL daily  - Monitor aPTT levels until correlating with heparin  levels, then can transition to monitoring with heparin  levels only - Monitor CBC and signs/symptoms of bleeding  Thank you for involving pharmacy in this patient's care.   Jaskirat Schwieger A Wynn Alldredge, PharmD Clinical Pharmacist 07/02/2024 4:41 PM

## 2024-07-02 NOTE — Plan of Care (Signed)

## 2024-07-02 NOTE — Evaluation (Signed)
 Physical Therapy Evaluation Patient Details Name: Nathaniel Gamble MRN: [id number] DOB: November 15, 1935 Today's Date: 07/02/2024  History of Present Illness  Pt is an 88 y.o. male with a known history of abdominal aortic aneurysm, melanoma, CKD 3B with secondary hyperparathyroidism, CAD status post MI and CVA, diabetes, hypertension, intracranial hemorrhage 4 years ago, peripheral vascular disease on Eliquis , renal artery stenosis presents to the emergency department for evaluation of lower extremity swelling. MD assessment includes: ischemia of left lower extremity with complete occlusion of left superficial femoral artery and complete occlusion of the posterior tibial artery, L ankle swelling with imaging negative, AKI, and thrombocytopenia.   Clinical Impression  Pt was pleasant and motivated to participate during the session and put forth good effort throughout. Pt required no physical assistance during the session but did require cuing for proper sequencing with transfers and use of the RW.  Pt was able to amb both with a RW and then per pt request without an AD.  Pt ambulated with similarly slow cadence and short bilateral step length both with and with an AD and was generally steady with no overt LOB including during start/stops, 180 deg turns, and negotiating tight spaces around obstacles.  Pt reported that he had just recently graduated from receiving HHPT services and declined further PT intervention at discharge.  Pt reported no adverse symptoms during the session with SpO2 and HR WNL throughout on room air. Pt will benefit from continued PT services upon discharge to safely address deficits listed in patient problem list for decreased caregiver assistance and eventual return to PLOF.          If plan is discharge home, recommend the following: A little help with walking and/or transfers;A little help with bathing/dressing/bathroom;Assistance with cooking/housework;Assist for transportation;Help  with stairs or ramp for entrance   Can travel by private vehicle        Equipment Recommendations Rolling walker (2 wheels);Other (comment) (Pt declining RW)  Recommendations for Other Services       Functional Status Assessment Patient has had a recent decline in their functional status and demonstrates the ability to make significant improvements in function in a reasonable and predictable amount of time.     Precautions / Restrictions Precautions Precautions: Fall Restrictions Weight Bearing Restrictions Per Provider Order: No      Mobility  Bed Mobility               General bed mobility comments: NT, pt in chair pre/post session    Transfers Overall transfer level: Needs assistance Equipment used: Rolling walker (2 wheels), None               General transfer comment: Cuing for increased trunk flexion and hand placement but no physical assist needed to come to standing    Ambulation/Gait Ambulation/Gait assistance: Contact guard assist Gait Distance (Feet): 125 Feet Assistive device: Rolling walker (2 wheels), None Gait Pattern/deviations: Step-through pattern, Decreased step length - right, Decreased step length - left, Trunk flexed Gait velocity: decreased     General Gait Details: Slow cadence with short bilateral step length but generally steady with no overt LOB both with a RW and without an AD; min verbal cues for proper sequencing with the RW most notably for amb closer to the RW and to stay within the RW during sharp turns  Stairs            Wheelchair Mobility     Tilt Bed    Modified Rankin (Stroke Patients  Only)       Balance Overall balance assessment: Needs assistance   Sitting balance-Leahy Scale: Good     Standing balance support: No upper extremity supported, During functional activity Standing balance-Leahy Scale: Good                               Pertinent Vitals/Pain Pain Assessment Pain  Assessment: No/denies pain    Home Living Family/patient expects to be discharged to:: Private residence Living Arrangements: Spouse/significant other Available Help at Discharge: Family;Available 24 hours/day (spouse with dementia per patient) Type of Home: House Home Access: Stairs to enter Entrance Stairs-Rails: Right;Left;Can reach both Entrance Stairs-Number of Steps: 3   Home Layout: One level Home Equipment: BSC/3in1;Rollator (4 wheels);Shower seat - built in;Cane - quad      Prior Function Prior Level of Function : Independent/Modified Independent;History of Falls (last six months)             Mobility Comments: Mostly ind ambulation without an AD community distances, PRN use of a QC, 2 falls in the last 6 months both while working outdoors on uneven terrain ADLs Comments: Ind with ADLs     Extremity/Trunk Assessment   Upper Extremity Assessment Upper Extremity Assessment: Generalized weakness    Lower Extremity Assessment Lower Extremity Assessment: Generalized weakness       Communication        Cognition Arousal: Alert Behavior During Therapy: WFL for tasks assessed/performed   PT - Cognitive impairments: No apparent impairments                         Following commands: Intact       Cueing Cueing Techniques: Verbal cues, Tactile cues     General Comments      Exercises     Assessment/Plan    PT Assessment Patient needs continued PT services  PT Problem List Decreased strength;Decreased activity tolerance;Decreased balance;Decreased mobility;Decreased knowledge of use of DME       PT Treatment Interventions DME instruction;Gait training;Stair training;Functional mobility training;Therapeutic activities;Therapeutic exercise;Balance training;Patient/family education    PT Goals (Current goals can be found in the Care Plan section)  Acute Rehab PT Goals Patient Stated Goal: To return home PT Goal Formulation: With patient Time  For Goal Achievement: 07/15/24 Potential to Achieve Goals: Good    Frequency Min 2X/week     Co-evaluation               AM-PAC PT 6 Clicks Mobility  Outcome Measure Help needed turning from your back to your side while in a flat bed without using bedrails?: A Little Help needed moving from lying on your back to sitting on the side of a flat bed without using bedrails?: A Little Help needed moving to and from a bed to a chair (including a wheelchair)?: A Little Help needed standing up from a chair using your arms (e.g., wheelchair or bedside chair)?: A Little Help needed to walk in hospital room?: A Little Help needed climbing 3-5 steps with a railing? : A Little 6 Click Score: 18    End of Session Equipment Utilized During Treatment: Gait belt Activity Tolerance: Patient tolerated treatment well Patient left: in chair;with call bell/phone within reach Nurse Communication: Mobility status;Other (comment) (Pt found in visitor chair without chair alarm and requested to be left that way at end of session, nursing notified) PT Visit Diagnosis: History of falling (Z91.81);Difficulty in  walking, not elsewhere classified (R26.2);Muscle weakness (generalized) (M62.81)    Time: 9154-9088 PT Time Calculation (min) (ACUTE ONLY): 26 min   Charges:   PT Evaluation $PT Eval Moderate Complexity: 1 Mod PT Treatments $Gait Training: 8-22 mins PT General Charges $$ ACUTE PT VISIT: 1 Visit       D. Glendia Bertin PT, DPT 07/02/24, 10:37 AM

## 2024-07-02 NOTE — Progress Notes (Signed)
 Mobility Specialist - Progress Note   07/02/24 1602  Mobility  Activity Ambulated with assistance;Stood at bedside  Level of Assistance Contact guard assist, steadying assist  Assistive Device None  Distance Ambulated (ft) 24 ft  Activity Response Tolerated well  Mobility visit 1 Mobility  Mobility Specialist Start Time (ACUTE ONLY) 1541  Mobility Specialist Stop Time (ACUTE ONLY) 1601  Mobility Specialist Time Calculation (min) (ACUTE ONLY) 20 min   Pt standing near the window upon entry, utilizing RA. Pt expressed that he stood up indep to use the urinal, however spilled most of the fluids on the floor. MS assist Pt with cleaning the floor, Pt able to bend over and pick up clothing with CGA. Pt amb around the bed, to the bathroom with CGA-MinG, washes hands and returns to the recliner. Pt required MinA to don undergarments and pants. Pt left seated with needs within reach--- RN notified of chair alarm.  America Silvan Mobility Specialist 07/02/24 4:17 PM

## 2024-07-02 NOTE — Progress Notes (Signed)
 PHARMACY - ANTICOAGULATION CONSULT NOTE  Pharmacy Consult for heparin  Indication: DVT  Allergies  Allergen Reactions   Oxycodone  Nausea Only   Lisinopril Swelling   Tetanus Toxoids Swelling    Patient Measurements: Height: 5' 10 (177.8 cm) Weight: 68 kg (149 lb 14.6 oz) IBW/kg (Calculated) : 73 HEPARIN  DW (KG): 68  Vital Signs: Temp: 98 F (36.7 C) (08/19 0727) Temp Source: Oral (08/19 0408) BP: 116/53 (08/19 0727) Pulse Rate: 56 (08/19 0727)  Labs: Recent Labs    06/30/24 1600 06/30/24 1600 06/30/24 1625 07/01/24 0232 07/01/24 1308 07/01/24 2306 07/02/24 0451 07/02/24 0826  HGB 13.6  --   --  12.0*  --   --  11.2*  --   HCT 41.9  --   --  36.5*  --   --  33.7*  --   PLT 109*  --   --  91*  --   --  79*  --   APTT  --    < > 32 130* 126* 108*  --  67*  LABPROT  --   --  17.4*  --   --   --   --   --   INR  --   --  1.4*  --   --   --   --   --   HEPARINUNFRC  --   --  >1.10* >1.10*  --   --   --  1.02*  CREATININE 2.31*  --   --  1.87*  --   --  1.74*  --    < > = values in this interval not displayed.    Estimated Creatinine Clearance: 29.9 mL/min (A) (by C-G formula based on SCr of 1.74 mg/dL (H)).   Medical History: Past Medical History:  Diagnosis Date   Abdominal aortic aneurysm (HCC)    monitored by dr. bosie   Abdominal aortic aneurysm (HCC)    Cancer (HCC) 1976   melanoma, under right arm, removed   Chronic kidney disease 2011   stent right kidney   Coronary artery disease    Diabetes mellitus without complication (HCC)    Hypertension    Jaw fracture (HCC)    Myocardial infarction (HCC)    Peripheral vascular disease (HCC)    Renal artery stenosis (HCC)    Stroke [facility])    Assessment: 88 y/o male presenting with swelling, redness, and pain in the left lower extremity - found to have complete occlusion of left superficial femoral artery. PMH significant for abdominal aortic aneurysm, T2DM, atrial fibrillation on Eliquis , seizures, PAD, CKD  III, HTN, stroke (2019, 2020). Pharmacy has been consulted to initiate and manage heparin  infusion.  Last dose of Eliquis : 5 mg on 06/30/24 in AM Baseline labs: hgb 13.6, plt 109, HL>1.10, aPTT 37, INR 1.4  Given recent use of apixaban , and elevated baseline heparin  level, will monitor with aPTT levels until correlating with heparin  levels.   8/18 @ 0232:  aPTT = 130,  HL = > 1.10 8/18 @ 1308: aPTT= 126 8/18 2306 aPTT = 108, supratherapeutic 8/19 0826 aPTT = 67, therapeutic x 1, not correlating with HL 1.02  Goal of Therapy:  No boluses Heparin  level 0.3-0.5 units/ml Monitor platelets by anticoagulation protocol: Yes   Plan:  Per MD, will not give heparin  boluses 8/19: aPTT therapeutic x 1, but not correlating with heparin  level yet. Hgb stable, but PLTs down trending. Will continue to monitor.   - Continue heparin  infusion at 600 units/hr - Recheck  aPTT in 8 hrs  - Recheck HL daily  - Monitor aPTT levels until correlating with heparin  levels, then can transition to monitoring with heparin  levels only - Monitor CBC and signs/symptoms of bleeding  Thank you for involving pharmacy in this patient's care.    Ransom Blanch PGY-1 Pharmacy Resident  [place] - [facility]  07/02/2024 10:03 AM

## 2024-07-03 DIAGNOSIS — M79605 Pain in left leg: Secondary | ICD-10-CM

## 2024-07-03 DIAGNOSIS — I998 Other disorder of circulatory system: Secondary | ICD-10-CM | POA: Diagnosis not present

## 2024-07-03 DIAGNOSIS — I7090 Unspecified atherosclerosis: Secondary | ICD-10-CM

## 2024-07-03 LAB — APTT
aPTT: 80 s — ABNORMAL HIGH (ref 24–36)
aPTT: 56 s — ABNORMAL HIGH (ref 24–36)
aPTT: 50 s — ABNORMAL HIGH (ref 24–36)

## 2024-07-03 LAB — BASIC METABOLIC PANEL WITH GFR
Anion gap: 8 (ref 5–15)
BUN: 44 mg/dL — ABNORMAL HIGH (ref 8–23)
CO2: 21 mmol/L — ABNORMAL LOW (ref 22–32)
Calcium: 9.1 mg/dL (ref 8.9–10.3)
Chloride: 111 mmol/L (ref 98–111)
Creatinine, Ser: 1.8 mg/dL — ABNORMAL HIGH (ref 0.61–1.24)
GFR, Estimated: 36 mL/min — ABNORMAL LOW (ref >60)
Glucose, Bld: 100 mg/dL — ABNORMAL HIGH (ref 70–99)
Potassium: 4.5 mmol/L (ref 3.5–5.1)
Sodium: 140 mmol/L (ref 135–145)

## 2024-07-03 LAB — CBC
HCT: 34.8 % — ABNORMAL LOW (ref 39.0–52.0)
Hemoglobin: 11.3 g/dL — ABNORMAL LOW (ref 13.0–17.0)
MCH: 30.5 pg (ref 26.0–34.0)
MCHC: 32.5 g/dL (ref 30.0–36.0)
MCV: 93.8 fL (ref 80.0–100.0)
Platelets: 84 K/uL — ABNORMAL LOW (ref 150–400)
RBC: 3.71 MIL/uL — ABNORMAL LOW (ref 4.22–5.81)
RDW: 14.8 % (ref 11.5–15.5)
WBC: 5 K/uL (ref 4.0–10.5)
nRBC: 0 % (ref 0.0–0.2)

## 2024-07-03 LAB — HEPARIN LEVEL (UNFRACTIONATED): Heparin Unfractionated: 0.65 [IU]/mL (ref 0.30–0.70)

## 2024-07-03 LAB — GLUCOSE, CAPILLARY
Glucose-Capillary: 87 mg/dL (ref 70–99)
Glucose-Capillary: 100 mg/dL — ABNORMAL HIGH (ref 70–99)

## 2024-07-03 NOTE — Plan of Care (Signed)

## 2024-07-03 NOTE — Progress Notes (Signed)
 PHARMACY - ANTICOAGULATION CONSULT NOTE  Pharmacy Consult for heparin  Indication: DVT  Allergies  Allergen Reactions   Oxycodone  Nausea Only   Lisinopril Swelling   Tetanus Toxoids Swelling    Patient Measurements: Height: 5' 10 (177.8 cm) Weight: 68 kg (149 lb 14.6 oz) IBW/kg (Calculated) : 73 HEPARIN  DW (KG): 68  Vital Signs: Temp: 98.2 F (36.8 C) (08/20 0834) Temp Source: Oral (08/20 0411) BP: 136/53 (08/20 0834) Pulse Rate: 55 (08/20 0834)  Labs: Recent Labs    06/30/24 1625 07/01/24 0232 07/01/24 1308 07/02/24 0451 07/02/24 0826 07/02/24 1617 07/03/24 0043 07/03/24 0441 07/03/24 0931  HGB  --  12.0*  --  11.2*  --   --   --  11.3*  --   HCT  --  36.5*  --  33.7*  --   --   --  34.8*  --   PLT  --  91*  --  79*  --   --   --  84*  --   APTT 32 130*   < >  --  67* 59* 80*  --  56*  LABPROT 17.4*  --   --   --   --   --   --   --   --   INR 1.4*  --   --   --   --   --   --   --   --   HEPARINUNFRC >1.10* >1.10*  --   --  1.02*  --   --   --  0.65  CREATININE  --  1.87*  --  1.74*  --   --   --  1.80*  --    < > = values in this interval not displayed.    Estimated Creatinine Clearance: 28.9 mL/min (A) (by C-G formula based on SCr of 1.8 mg/dL (H)).   Medical History: Past Medical History:  Diagnosis Date   Abdominal aortic aneurysm (HCC)    monitored by dr. bosie   Abdominal aortic aneurysm (HCC)    Cancer (HCC) 1976   melanoma, under right arm, removed   Chronic kidney disease 2011   stent right kidney   Coronary artery disease    Diabetes mellitus without complication (HCC)    Hypertension    Jaw fracture (HCC)    Myocardial infarction (HCC)    Peripheral vascular disease (HCC)    Renal artery stenosis (HCC)    Stroke [facility])    Assessment: 88 y/o male presenting with swelling, redness, and pain in the left lower extremity - found to have complete occlusion of left superficial femoral artery. PMH significant for abdominal aortic aneurysm,  T2DM, atrial fibrillation on Eliquis , seizures, PAD, CKD III, HTN, stroke (2019, 2020). Pharmacy has been consulted to initiate and manage heparin  infusion.  Last dose of Eliquis : 5 mg on 06/30/24 in AM Baseline labs: hgb 13.6, plt 109, HL>1.10, aPTT 37, INR 1.4  Given recent use of apixaban , and elevated baseline heparin  level, will monitor with aPTT levels until correlating with heparin  levels.   8/18 @ 0232:  aPTT = 130,  HL = > 1.10 8/18 @ 1308: aPTT= 126 8/18 2306 aPTT = 108, supratherapeutic 8/19 0826 aPTT = 67, therapeutic x 1, not correlating with HL 1.02 8/19 1617 aPTT = 59, subtherapeutic 8/20 0043 aPTT = 80. Therapeutic x 1 8/20 0931 aPTT = 56, SUBtherapeutic, not correlating with HL 0.65   Goal of Therapy:  No boluses Heparin  level 0.3-0.5 units/ml aPTT 66-85  sec Monitor platelets by anticoagulation protocol: Yes   Plan:  Per MD, will not give heparin  boluses  8/20: aPTT is SUBtherapeutic, and HL is not correlating as of yet. Will increase infusion rate by 1 unit/kg/hr. Hgb is stable and platelets have been low but also stable. Will continue to monitor.   - Change heparin  infusion to 700 units/hr - Recheck aPTT in 8 hrs  - Recheck HL daily  - Monitor aPTT levels until correlating with heparin  levels, then can transition to monitoring with heparin  levels only - Monitor CBC and signs/symptoms of bleeding  Thank you for involving pharmacy in this patient's care.    Ransom Blanch PGY-1 Pharmacy Resident  [place] - [facility]  07/03/2024 10:29 AM

## 2024-07-03 NOTE — Progress Notes (Signed)
 PHARMACY - ANTICOAGULATION CONSULT NOTE  Pharmacy Consult for heparin  Indication: DVT  Allergies  Allergen Reactions   Oxycodone  Nausea Only   Lisinopril Swelling   Tetanus Toxoids Swelling    Patient Measurements: Height: 5' 10 (177.8 cm) Weight: 68 kg (149 lb 14.6 oz) IBW/kg (Calculated) : 73 HEPARIN  DW (KG): 68  Vital Signs: Temp: 97.8 F (36.6 C) (08/19 1459) Temp Source: Oral (08/19 1459) BP: 126/68 (08/19 2054) Pulse Rate: 66 (08/19 2054)  Labs: Recent Labs    06/30/24 1600 06/30/24 1600 06/30/24 1625 07/01/24 0232 07/01/24 1308 07/02/24 0451 07/02/24 0826 07/02/24 1617 07/03/24 0043  HGB 13.6  --   --  12.0*  --  11.2*  --   --   --   HCT 41.9  --   --  36.5*  --  33.7*  --   --   --   PLT 109*  --   --  91*  --  79*  --   --   --   APTT  --    < > 32 130*   < >  --  67* 59* 80*  LABPROT  --   --  17.4*  --   --   --   --   --   --   INR  --   --  1.4*  --   --   --   --   --   --   HEPARINUNFRC  --   --  >1.10* >1.10*  --   --  1.02*  --   --   CREATININE 2.31*  --   --  1.87*  --  1.74*  --   --   --    < > = values in this interval not displayed.    Estimated Creatinine Clearance: 29.9 mL/min (A) (by C-G formula based on SCr of 1.74 mg/dL (H)).   Medical History: Past Medical History:  Diagnosis Date   Abdominal aortic aneurysm (HCC)    monitored by dr. bosie   Abdominal aortic aneurysm (HCC)    Cancer (HCC) 1976   melanoma, under right arm, removed   Chronic kidney disease 2011   stent right kidney   Coronary artery disease    Diabetes mellitus without complication (HCC)    Hypertension    Jaw fracture (HCC)    Myocardial infarction (HCC)    Peripheral vascular disease (HCC)    Renal artery stenosis (HCC)    Stroke [facility])    Assessment: 88 y/o male presenting with swelling, redness, and pain in the left lower extremity - found to have complete occlusion of left superficial femoral artery. PMH significant for abdominal aortic aneurysm,  T2DM, atrial fibrillation on Eliquis , seizures, PAD, CKD III, HTN, stroke (2019, 2020). Pharmacy has been consulted to initiate and manage heparin  infusion.  Last dose of Eliquis : 5 mg on 06/30/24 in AM Baseline labs: hgb 13.6, plt 109, HL>1.10, aPTT 37, INR 1.4  Given recent use of apixaban , and elevated baseline heparin  level, will monitor with aPTT levels until correlating with heparin  levels.   8/18 @ 0232:  aPTT = 130,  HL = > 1.10 8/18 @ 1308: aPTT= 126 8/18 2306 aPTT = 108, supratherapeutic 8/19 0826 aPTT = 67, therapeutic x 1, not correlating with HL 1.02 8/19 1617 aPTT = 59, subtherapeutic 8/20 0043 aPTT = 80. Therapeutic x 1  Goal of Therapy:  No boluses Heparin  level 0.3-0.5 units/ml aPTT 66-85 sec Monitor platelets by anticoagulation protocol: Yes  Plan:  Per MD, will not give heparin  boluses  - Continue heparin  infusion at 650 units/hr - Recheck aPTT in 8 hrs to confirm - Recheck HL daily  - Monitor aPTT levels until correlating with heparin  levels, then can transition to monitoring with heparin  levels only - Monitor CBC and signs/symptoms of bleeding  Thank you for involving pharmacy in this patient's care.   Rankin CANDIE Dills, PharmD, MBA 07/03/2024 1:04 AM

## 2024-07-03 NOTE — Progress Notes (Signed)
 Progress Note   Patient: Nathaniel Gamble FMW:[id number] DOB: [birth year] DOA: 06/30/2024     3 DOS: the patient was seen and examined on 07/03/2024   Brief hospital course: 88 y.o. male with a known history of abdominal aortic aneurysm, melanoma, CKD 3B with secondary hyperparathyroidism, CAD status post MI and CVA, diabetes, hypertension, intracranial hemorrhage 4 years ago, peripheral vascular disease on Eliquis , renal artery stenosis presents to the emergency department for evaluation of lower extremity swelling.  Patient was in a usual state of health until this morning at 5:30 AM he woke up with sudden onset of severe left lower extremity pain and swelling, noticed discoloration of his left second toe.  Patient reports that his pain is significantly improved   Patient denies fevers/chills, weakness, dizziness, chest pain, shortness of breath, N/V/C/D, abdominal pain, dysuria/frequency, changes in mental status.    Otherwise there has been no change in status. Patient has been taking medication as prescribed and there has been no recent change in medication or diet.  No recent antibiotics.  There has been no recent illness, hospitalizations, travel or sick contacts.     EMS/ED Course: Patient received Tylenol , normal saline, heparin  infusion without bolus. Medical admission has been requested for further management of ischemic limb.  8/18.  Patient states his foot was red yesterday but not red today.  Patient feels okay.  He states that his foot was on a pillow and slid off and landed on the ground.  Patient was brought in with swelling of his ankle.  Arterial ultrasound shows a complete occlusion of the left superficial femoral artery and complete occlusion of the posterior tibial artery. 8/19.  Case discussed with vascular surgery and they will plan on doing an angiogram either on Thursday or Friday.  Evidence of moderate bilateral lower extremity peripheral vascular disease with resting  ankle-brachial index as of 0.59 on the right and 0.6 on the left.   I assumed care on 07/03/24. Further hospital course and management as outlined below.   Assessment and Plan:  * Ischemia of left lower extremity Vascular ultrasound showed complete occlusion of left superficial femoral artery and complete occlusion of the posterior tibial artery.  ABI low at 0.6 left leg and 0.59 right leg. --On heparin  drip --Holding Eliquis  --Continue aspirin  --Vascular surgery following - plan for angiogram on Thursday or Friday --Pain control PRN    Left ankle swelling Continue to monitor.  X-ray negative.  Uric acid normal range.   AKI superimposed on CKD stage IIIb AKI has resolved - Cr 2.31 on admission Cr now 1.7-1.8 likely his baseline --On Farxiga  --Gentle IV hydration pre-angiogram  --Monitor BMP --Renally dose meds, avoid nephrotoxins & hypotension   Essential hypertension --On amlodipine  Coreg  and hydralazine  --Resumed on losartan , initially held for AKI   History of CAD (coronary artery disease) --On aspirin    Hyperlipidemia, unspecified --On simvastatin    Thrombocytopenia (HCC) Hepatitis C negative.  Seems chronic. --Monitor CBC      Subjective: Pt up in recliner, son at bedside on AM rounds today.  Pt denies complaints including pain, fever/chills.  He is eager to get angiogram completed.  RN arrived to room during encounter, informed pt she'd just been told procedure delayed for today.   Physical Exam: Vitals:   07/02/24 1459 07/02/24 2054 07/03/24 0411 07/03/24 0834  BP: (!) 141/57 126/68 (!) 152/74 (!) 136/53  Pulse: 92 66 (!) 57 (!) 55  Resp: 18 18 16 18   Temp: 97.8 F (36.6 C)  97.8 F (36.6 C) 98.2 F (36.8 C)  TempSrc: Oral  Oral   SpO2: 97% 97% 98% 98%  Weight:      Height:       General exam: awake, alert, no acute distress HEENT: moist mucus membranes, hearing grossly normal  Respiratory system: CTAB, no wheezes, rales or rhonchi, normal  respiratory effort. Cardiovascular system: normal S1/S2, RRR, no JVD, murmurs, rubs, gallops, no pedal edema.   Gastrointestinal system: soft, NT, ND, no HSM felt, +bowel sounds. Central nervous system: A&O x2+. no gross focal neurologic deficits, normal speech Extremities: LLE pitting edema, no RLE edema, BLE venous stasis changes Skin: dry, intact, normal temperature Psychiatry: normal mood, congruent affect, judgement and insight appear normal   Data Reviewed:  Notable labs -- Cr 1.80, bicarb 21, BUN 44, Hbg stable 11.3, platelets 84k  Family Communication: son at bedside on rounds  Disposition: Status is: Inpatient Remains inpatient appropriate because: inpatient procedures planned on Friday, on IV heparin  until then   Planned Discharge Destination: Home with Home Health    Time spent: 45 minutes  Author: Burnard DELENA Cunning, DO 07/03/2024 3:29 PM  For on call review [email or web address].

## 2024-07-03 NOTE — Care Management Important Message (Signed)
 Important Message  Patient Details  Name: Nathaniel Gamble MRN: [id number] Date of Birth: Nov 08, 1936   Important Message Given:  Yes - Medicare IM     Rojelio [name] 07/03/2024, 12:31 PM

## 2024-07-03 NOTE — Progress Notes (Signed)
 PHARMACY - ANTICOAGULATION CONSULT NOTE  Pharmacy Consult for heparin  Indication: DVT  Allergies  Allergen Reactions   Oxycodone  Nausea Only   Lisinopril Swelling   Tetanus Toxoids Swelling    Patient Measurements: Height: 5' 10 (177.8 cm) Weight: 68 kg (149 lb 14.6 oz) IBW/kg (Calculated) : 73 HEPARIN  DW (KG): 68  Vital Signs: Temp: 98.4 F (36.9 C) (08/20 2048) Temp Source: Oral (08/20 2048) BP: 136/53 (08/20 2048) Pulse Rate: 60 (08/20 2048)  Labs: Recent Labs    07/01/24 0232 07/01/24 1308 07/02/24 0451 07/02/24 0826 07/02/24 1617 07/03/24 0043 07/03/24 0441 07/03/24 0931 07/03/24 2111  HGB 12.0*  --  11.2*  --   --   --  11.3*  --   --   HCT 36.5*  --  33.7*  --   --   --  34.8*  --   --   PLT 91*  --  79*  --   --   --  84*  --   --   APTT 130*   < >  --  67*   < > 80*  --  56* 50*  HEPARINUNFRC >1.10*  --   --  1.02*  --   --   --  0.65  --   CREATININE 1.87*  --  1.74*  --   --   --  1.80*  --   --    < > = values in this interval not displayed.    Estimated Creatinine Clearance: 28.9 mL/min (A) (by C-G formula based on SCr of 1.8 mg/dL (H)).   Medical History: Past Medical History:  Diagnosis Date   Abdominal aortic aneurysm (HCC)    monitored by dr. bosie   Abdominal aortic aneurysm (HCC)    Cancer (HCC) 1976   melanoma, under right arm, removed   Chronic kidney disease 2011   stent right kidney   Coronary artery disease    Diabetes mellitus without complication (HCC)    Hypertension    Jaw fracture (HCC)    Myocardial infarction (HCC)    Peripheral vascular disease (HCC)    Renal artery stenosis (HCC)    Stroke [facility])    Assessment: 88 y/o male presenting with swelling, redness, and pain in the left lower extremity - found to have complete occlusion of left superficial femoral artery. PMH significant for abdominal aortic aneurysm, T2DM, atrial fibrillation on Eliquis , seizures, PAD, CKD III, HTN, stroke (2019, 2020). Pharmacy has been  consulted to initiate and manage heparin  infusion.  Last dose of Eliquis : 5 mg on 06/30/24 in AM Baseline labs: hgb 13.6, plt 109, HL>1.10, aPTT 37, INR 1.4  Given recent use of apixaban , and elevated baseline heparin  level, will monitor with aPTT levels until correlating with heparin  levels.   8/18 @ 0232:  aPTT = 130,  HL = > 1.10 8/18 @ 1308: aPTT= 126 8/18 2306 aPTT = 108, supratherapeutic 8/19 0826 aPTT = 67, therapeutic x 1, not correlating with HL 1.02 8/19 1617 aPTT = 59, subtherapeutic 8/20 0043 aPTT = 80. Therapeutic x 1 8/20 0931 aPTT = 56, SUBtherapeutic, not correlating with HL 0.65  8/20 2111 aPTT = 50, SUBtherapeutic  Goal of Therapy:  No boluses Heparin  level 0.3-0.5 units/ml aPTT 66-85 sec Monitor platelets by anticoagulation protocol: Yes    Plan:  Per MD, will not give heparin  boluses  - Increase rate of heparin  infusion to 950 units/hr - Recheck aPTT 8 hours after rate change - Recheck HL daily  -  Monitor aPTT levels until correlating with heparin  levels, then can transition to monitoring with heparin  levels only - Monitor CBC and signs/symptoms of bleeding  Thank you for involving pharmacy in this patient's care.    Will M. Lenon, PharmD Clinical Pharmacist 07/03/2024 10:17 PM

## 2024-07-04 ENCOUNTER — Encounter
Admission: EM | Disposition: A | Discharge status: Home / Self Care | Payer: Self-pay | Source: Home / Self Care | Attending: Internal Medicine

## 2024-07-04 ENCOUNTER — Encounter: Payer: Self-pay | Admitting: Vascular Surgery

## 2024-07-04 DIAGNOSIS — I998 Other disorder of circulatory system: Secondary | ICD-10-CM | POA: Diagnosis not present

## 2024-07-04 DIAGNOSIS — I70212 Atherosclerosis of native arteries of extremities with intermittent claudication, left leg: Secondary | ICD-10-CM | POA: Diagnosis not present

## 2024-07-04 DIAGNOSIS — I7090 Unspecified atherosclerosis: Secondary | ICD-10-CM | POA: Diagnosis not present

## 2024-07-04 DIAGNOSIS — M79605 Pain in left leg: Secondary | ICD-10-CM

## 2024-07-04 DIAGNOSIS — T82858A Stenosis of vascular prosthetic devices, implants and grafts, initial encounter: Secondary | ICD-10-CM | POA: Diagnosis not present

## 2024-07-04 DIAGNOSIS — I724 Aneurysm of artery of lower extremity: Secondary | ICD-10-CM

## 2024-07-04 DIAGNOSIS — N289 Disorder of kidney and ureter, unspecified: Secondary | ICD-10-CM | POA: Diagnosis not present

## 2024-07-04 HISTORY — PX: LOWER EXTREMITY ANGIOGRAPHY: CATH118251

## 2024-07-04 LAB — BASIC METABOLIC PANEL WITH GFR
Anion gap: 7 (ref 5–15)
BUN: 39 mg/dL — ABNORMAL HIGH (ref 8–23)
CO2: 21 mmol/L — ABNORMAL LOW (ref 22–32)
Calcium: 9 mg/dL (ref 8.9–10.3)
Chloride: 113 mmol/L — ABNORMAL HIGH (ref 98–111)
Creatinine, Ser: 1.84 mg/dL — ABNORMAL HIGH (ref 0.61–1.24)
GFR, Estimated: 35 mL/min — ABNORMAL LOW (ref >60)
Glucose, Bld: 91 mg/dL (ref 70–99)
Potassium: 4.8 mmol/L (ref 3.5–5.1)
Sodium: 141 mmol/L (ref 135–145)

## 2024-07-04 LAB — CBC
HCT: 34.7 % — ABNORMAL LOW (ref 39.0–52.0)
Hemoglobin: 11.2 g/dL — ABNORMAL LOW (ref 13.0–17.0)
MCH: 30.5 pg (ref 26.0–34.0)
MCHC: 32.3 g/dL (ref 30.0–36.0)
MCV: 94.6 fL (ref 80.0–100.0)
Platelets: 85 K/uL — ABNORMAL LOW (ref 150–400)
RBC: 3.67 MIL/uL — ABNORMAL LOW (ref 4.22–5.81)
RDW: 14.6 % (ref 11.5–15.5)
WBC: 4.1 K/uL (ref 4.0–10.5)
nRBC: 0 % (ref 0.0–0.2)

## 2024-07-04 LAB — GLUCOSE, CAPILLARY
Glucose-Capillary: 86 mg/dL (ref 70–99)
Glucose-Capillary: 90 mg/dL (ref 70–99)
Glucose-Capillary: 90 mg/dL (ref 70–99)
Glucose-Capillary: 102 mg/dL — ABNORMAL HIGH (ref 70–99)

## 2024-07-04 LAB — HEPARIN LEVEL (UNFRACTIONATED): Heparin Unfractionated: 0.57 [IU]/mL (ref 0.30–0.70)

## 2024-07-04 LAB — APTT
aPTT: 83 s — ABNORMAL HIGH (ref 24–36)
aPTT: 33 s (ref 24–36)

## 2024-07-04 SURGERY — LOWER EXTREMITY ANGIOGRAPHY
Anesthesia: Moderate Sedation | Laterality: Left

## 2024-07-04 MED ORDER — FENTANYL CITRATE (PF) 100 MCG/2ML IJ SOLN
INTRAMUSCULAR | Status: DC | PRN
  Administered 2024-07-04: 50 ug via INTRAVENOUS

## 2024-07-04 MED ORDER — HEPARIN (PORCINE) IN NACL 1000-0.9 UT/500ML-% IV SOLN
INTRAVENOUS | Status: DC | PRN
  Administered 2024-07-04: 1000 mL

## 2024-07-04 MED ORDER — METHYLPREDNISOLONE SODIUM SUCC 125 MG IJ SOLR: 125.0000 mg | Freq: Once | INTRAMUSCULAR | Status: DC | PRN

## 2024-07-04 MED ORDER — IODIXANOL 320 MG/ML IV SOLN
INTRAVENOUS | Status: DC | PRN
  Administered 2024-07-04: 40 mL via INTRA_ARTERIAL

## 2024-07-04 MED ORDER — APIXABAN 2.5 MG PO TABS
2.5000 mg | ORAL_TABLET | Freq: Two times a day (BID) | ORAL | Status: DC
  Administered 2024-07-04 – 2024-07-05 (×2): 2.5 mg via ORAL
  Filled 2024-07-04 (×2): qty 1

## 2024-07-04 MED ORDER — LIDOCAINE-EPINEPHRINE (PF) 1 %-1:200000 IJ SOLN
INTRAMUSCULAR | Status: DC | PRN
  Administered 2024-07-04: 10 mL via INTRADERMAL

## 2024-07-04 MED ORDER — FAMOTIDINE 20 MG PO TABS: 40.0000 mg | ORAL_TABLET | Freq: Once | ORAL | Status: DC | PRN

## 2024-07-04 MED ORDER — DIPHENHYDRAMINE HCL 50 MG/ML IJ SOLN: 50.0000 mg | Freq: Once | INTRAMUSCULAR | Status: DC | PRN

## 2024-07-04 MED ORDER — MIDAZOLAM HCL 2 MG/2ML IJ SOLN
INTRAMUSCULAR | Status: AC
  Filled 2024-07-04: qty 4

## 2024-07-04 MED ORDER — MIDAZOLAM HCL 2 MG/ML PO SYRP: 8.0000 mg | ORAL_SOLUTION | Freq: Once | ORAL | Status: DC | PRN

## 2024-07-04 MED ORDER — HYDROCODONE-ACETAMINOPHEN 5-325 MG PO TABS
ORAL_TABLET | ORAL | Status: AC
  Filled 2024-07-04: qty 1

## 2024-07-04 MED ORDER — CEFAZOLIN SODIUM-DEXTROSE 2-4 GM/100ML-% IV SOLN
INTRAVENOUS | Status: AC
  Filled 2024-07-04: qty 100

## 2024-07-04 MED ORDER — MIDAZOLAM HCL 2 MG/2ML IJ SOLN
INTRAMUSCULAR | Status: DC | PRN
  Administered 2024-07-04: 1 mg via INTRAVENOUS

## 2024-07-04 MED ORDER — FENTANYL CITRATE (PF) 100 MCG/2ML IJ SOLN
INTRAMUSCULAR | Status: AC
Start: 2024-07-04 — End: 2024-07-04
  Filled 2024-07-04: qty 2

## 2024-07-04 MED ORDER — CEFAZOLIN SODIUM-DEXTROSE 2-4 GM/100ML-% IV SOLN
2.0000 g | INTRAVENOUS | Status: AC
  Administered 2024-07-04: 2 g via INTRAVENOUS

## 2024-07-04 MED ORDER — SODIUM CHLORIDE 0.9 % IV SOLN: INTRAVENOUS | Status: DC

## 2024-07-04 MED ORDER — HEPARIN SODIUM (PORCINE) 1000 UNIT/ML IJ SOLN
INTRAMUSCULAR | Status: AC
  Filled 2024-07-04: qty 10

## 2024-07-04 SURGICAL SUPPLY — 10 items
CATH ANGIO 5F PIGTAIL 65CM (CATHETERS) IMPLANT
CATH VS15FR (CATHETERS) IMPLANT
COVER PROBE ULTRASOUND 5X96 (MISCELLANEOUS) IMPLANT
DEVICE STARCLOSE SE CLOSURE (Vascular Products) IMPLANT
GLIDEWIRE ADV .035X260CM (WIRE) IMPLANT
PACK ANGIOGRAPHY (CUSTOM PROCEDURE TRAY) ×1 IMPLANT
SHEATH BRITE TIP 5FRX11 (SHEATH) IMPLANT
SYR MEDRAD MARK 7 150ML (SYRINGE) IMPLANT
TUBING CONTRAST HIGH PRESS 72 (TUBING) IMPLANT
WIRE J 3MM .035X145CM (WIRE) IMPLANT

## 2024-07-04 NOTE — Progress Notes (Signed)
 Progress Note    07/04/2024 9:42 AM * No surgery found *  Subjective:  Nathaniel Gamble is an 88 yo male abdominal aortic aneurysm status post repair, diabetes type 2, seizures like activity, peripheral artery disease, stage IIIb chronic kidney disease, hypertensive chronic kidney disease, renal artery stenosis, secondary hyperparathyroidism of renal origin, on Eliquis  who presents to the emergency department with sudden onset left lower extremity pain.  Patient was doing well yesterday and woke up at 5:30 AM with pain in his left lower extremity.  Since then he has noticed increased swelling and color changes especially in his second toe.  He reports compliance with his Eliquis     On my evaluation the patient's toes and forefoot are erythematous however they are nontender. He has some superficial abrasions over the second toe. He states that according to his wife his toes always look that way and have not changed significantly from the past. Non ABI arterial duplex revealed chronic short segment SFA occlusion, profunda femoris artery stenosis, posterior tibial artery occlusion with patent ATA and DP. Likely all chronic findings.    Vitals:   07/04/24 0538 07/04/24 0823  BP: (!) 152/72 138/60  Pulse: (!) 56 (!) 54  Resp: 18 18  Temp: 98.1 F (36.7 C) 98.2 F (36.8 C)  SpO2: 99% 98%   Physical Exam: Cardiac:  RRR, normal S1 and S2.  No murmurs. Lungs: Rhonchorous throughout and diminished in the bases.  Nonlabored breathing. Incisions: None Extremities: Bilateral lower extremities warm to touch.  Left lower extremity swollen ankle and foot. Abdomen: Positive bowel sounds throughout, soft, nontender nondistended. Neurologic: Alert and oriented x 3, answers all questions follows commands appropriately.    CBC    Component Value Date/Time   WBC 4.1 07/04/2024 0419   RBC 3.67 (L) 07/04/2024 0419   HGB 11.2 (L) 07/04/2024 0419   HCT 34.7 (L) 07/04/2024 0419   PLT 85 (L) 07/04/2024  0419   MCV 94.6 07/04/2024 0419   MCH 30.5 07/04/2024 0419   MCHC 32.3 07/04/2024 0419   RDW 14.6 07/04/2024 0419   LYMPHSABS 1.2 06/30/2024 1600   MONOABS 0.9 06/30/2024 1600   EOSABS 0.1 06/30/2024 1600   BASOSABS 0.0 06/30/2024 1600   BASOSABS 0 05/14/2012 1000    BMET    Component Value Date/Time   NA 141 07/04/2024 0419   K 4.8 07/04/2024 0419   CL 113 (H) 07/04/2024 0419   CO2 21 (L) 07/04/2024 0419   GLUCOSE 91 07/04/2024 0419   BUN 39 (H) 07/04/2024 0419   CREATININE 1.84 (H) 07/04/2024 0419   CALCIUM  9.0 07/04/2024 0419   GFRNONAA 35 (L) 07/04/2024 0419   GFRAA 42 (L) 06/07/2020 1929    INR    Component Value Date/Time   INR 1.4 (H) 06/30/2024 1625     Intake/Output Summary (Last 24 hours) at 07/04/2024 0942 Last data filed at 07/04/2024 0656 Gross per 24 hour  Intake 180 ml  Output 1450 ml  Net -1270 ml     Assessment/Plan:  88 y.o. male   88 y.o. male who presents yesterday to [facility] emergency department with extreme left lower extremity pain.  Upon workup patient underwent arterial duplex ultrasounds with ABIs.  This shows chronic short segment SFA occlusion, profunda femoris artery stenosis, posterior tibial artery occlusion with patent ATA and DP.  Patient's ABIs bilaterally were 0.59 and 0.60 to his lower extremities.* No surgery found *    Therefore vascular surgery recommends patient undergo angiogram with possible intervention  of the left lower extremity.  Vascular surgery plans on taking the patient to the vascular lab on today Thursday, 07/04/2024 for left lower extremity angiogram with possible intervention.  I discussed in detail at the bedside this morning with the patient the procedure, benefits, risk, complications.  Patient verbalizes understanding wishes to proceed.  Patient was made n.p.o. after midnight day of the procedure.  Patient is currently on a heparin  infusion.  Patient's most recent BUN and creatinine are 39/1.84 with a GFR of 35.   I  discussed the case in detail with Dr. Selinda Gu MD and he agrees with the plan.   DVT prophylaxis: Heparin  infusion * No surgery found *     [name] [facility] 07/04/2024 9:42 AM

## 2024-07-04 NOTE — H&P (View-Only) (Signed)
 Progress Note    07/04/2024 9:42 AM * No surgery found *  Subjective:  Nathaniel Gamble is an 88 yo male abdominal aortic aneurysm status post repair, diabetes type 2, seizures like activity, peripheral artery disease, stage IIIb chronic kidney disease, hypertensive chronic kidney disease, renal artery stenosis, secondary hyperparathyroidism of renal origin, on Eliquis  who presents to the emergency department with sudden onset left lower extremity pain.  Patient was doing well yesterday and woke up at 5:30 AM with pain in his left lower extremity.  Since then he has noticed increased swelling and color changes especially in his second toe.  He reports compliance with his Eliquis     On my evaluation the patient's toes and forefoot are erythematous however they are nontender. He has some superficial abrasions over the second toe. He states that according to his wife his toes always look that way and have not changed significantly from the past. Non ABI arterial duplex revealed chronic short segment SFA occlusion, profunda femoris artery stenosis, posterior tibial artery occlusion with patent ATA and DP. Likely all chronic findings.    Vitals:   07/04/24 0538 07/04/24 0823  BP: (!) 152/72 138/60  Pulse: (!) 56 (!) 54  Resp: 18 18  Temp: 98.1 F (36.7 C) 98.2 F (36.8 C)  SpO2: 99% 98%   Physical Exam: Cardiac:  RRR, normal S1 and S2.  No murmurs. Lungs: Rhonchorous throughout and diminished in the bases.  Nonlabored breathing. Incisions: None Extremities: Bilateral lower extremities warm to touch.  Left lower extremity swollen ankle and foot. Abdomen: Positive bowel sounds throughout, soft, nontender nondistended. Neurologic: Alert and oriented x 3, answers all questions follows commands appropriately.    CBC    Component Value Date/Time   WBC 4.1 07/04/2024 0419   RBC 3.67 (L) 07/04/2024 0419   HGB 11.2 (L) 07/04/2024 0419   HCT 34.7 (L) 07/04/2024 0419   PLT 85 (L) 07/04/2024  0419   MCV 94.6 07/04/2024 0419   MCH 30.5 07/04/2024 0419   MCHC 32.3 07/04/2024 0419   RDW 14.6 07/04/2024 0419   LYMPHSABS 1.2 06/30/2024 1600   MONOABS 0.9 06/30/2024 1600   EOSABS 0.1 06/30/2024 1600   BASOSABS 0.0 06/30/2024 1600   BASOSABS 0 05/14/2012 1000    BMET    Component Value Date/Time   NA 141 07/04/2024 0419   K 4.8 07/04/2024 0419   CL 113 (H) 07/04/2024 0419   CO2 21 (L) 07/04/2024 0419   GLUCOSE 91 07/04/2024 0419   BUN 39 (H) 07/04/2024 0419   CREATININE 1.84 (H) 07/04/2024 0419   CALCIUM  9.0 07/04/2024 0419   GFRNONAA 35 (L) 07/04/2024 0419   GFRAA 42 (L) 06/07/2020 1929    INR    Component Value Date/Time   INR 1.4 (H) 06/30/2024 1625     Intake/Output Summary (Last 24 hours) at 07/04/2024 0942 Last data filed at 07/04/2024 0656 Gross per 24 hour  Intake 180 ml  Output 1450 ml  Net -1270 ml     Assessment/Plan:  88 y.o. male   88 y.o. male who presents yesterday to [facility] emergency department with extreme left lower extremity pain.  Upon workup patient underwent arterial duplex ultrasounds with ABIs.  This shows chronic short segment SFA occlusion, profunda femoris artery stenosis, posterior tibial artery occlusion with patent ATA and DP.  Patient's ABIs bilaterally were 0.59 and 0.60 to his lower extremities.* No surgery found *    Therefore vascular surgery recommends patient undergo angiogram with possible intervention  of the left lower extremity.  Vascular surgery plans on taking the patient to the vascular lab on today Thursday, 07/04/2024 for left lower extremity angiogram with possible intervention.  I discussed in detail at the bedside this morning with the patient the procedure, benefits, risk, complications.  Patient verbalizes understanding wishes to proceed.  Patient was made n.p.o. after midnight day of the procedure.  Patient is currently on a heparin  infusion.  Patient's most recent BUN and creatinine are 39/1.84 with a GFR of 35.   I  discussed the case in detail with Dr. Selinda Gu MD and he agrees with the plan.   DVT prophylaxis: Heparin  infusion * No surgery found *     [name] [facility] 07/04/2024 9:42 AM

## 2024-07-04 NOTE — Progress Notes (Signed)
 Progress Note   Patient: Nathaniel Gamble FMW:[id number] DOB: 1936/02/10 DOA: 06/30/2024     4 DOS: the patient was seen and examined on 07/04/2024   Brief hospital course: 88 y.o. male with a known history of abdominal aortic aneurysm, melanoma, CKD 3B with secondary hyperparathyroidism, CAD status post MI and CVA, diabetes, hypertension, intracranial hemorrhage 4 years ago, peripheral vascular disease on Eliquis , renal artery stenosis presents to the emergency department for evaluation of lower extremity swelling.  Patient was in a usual state of health until this morning at 5:30 AM he woke up with sudden onset of severe left lower extremity pain and swelling, noticed discoloration of his left second toe.  Patient reports that his pain is significantly improved   Patient denies fevers/chills, weakness, dizziness, chest pain, shortness of breath, N/V/C/D, abdominal pain, dysuria/frequency, changes in mental status.    Otherwise there has been no change in status. Patient has been taking medication as prescribed and there has been no recent change in medication or diet.  No recent antibiotics.  There has been no recent illness, hospitalizations, travel or sick contacts.     EMS/ED Course: Patient received Tylenol , normal saline, heparin  infusion without bolus. Medical admission has been requested for further management of ischemic limb.  8/18.  Patient states his foot was red yesterday but not red today.  Patient feels okay.  He states that his foot was on a pillow and slid off and landed on the ground.  Patient was brought in with swelling of his ankle.  Arterial ultrasound shows a complete occlusion of the left superficial femoral artery and complete occlusion of the posterior tibial artery. 8/19.  Case discussed with vascular surgery and they will plan on doing an angiogram either on Thursday or Friday.  Evidence of moderate bilateral lower extremity peripheral vascular disease with resting  ankle-brachial index as of 0.59 on the right and 0.6 on the left.   I assumed care on 07/03/24. Further hospital course and management as outlined below.   Assessment and Plan:  * Ischemia of left lower extremity Vascular ultrasound showed complete occlusion of left superficial femoral artery and complete occlusion of the posterior tibial artery.  ABI low at 0.6 left leg and 0.59 right leg. --On heparin  drip --Holding Eliquis  --Continue aspirin  --Vascular surgery following -  --Taken for angiogram this AM - see report -- no interventions --Pain control PRN    Left ankle swelling Continue to monitor.  X-ray negative.  Uric acid normal range.   AKI superimposed on CKD stage IIIb AKI has resolved - Cr 2.31 on admission Cr now 1.7-1.8 likely his baseline --On Farxiga  --Stop IV fluids --Monitor BMP --Renally dose meds, avoid nephrotoxins & hypotension   Essential hypertension --On amlodipine  Coreg  and hydralazine  --Resumed on losartan , initially held for AKI   History of CAD (coronary artery disease) --On aspirin    Hyperlipidemia, unspecified --On simvastatin    Thrombocytopenia (HCC) Hepatitis C negative.  Seems chronic. --Monitor CBC      Subjective: Pt awake resting in bed son at bedside on rounds today.  Pt had angiogram this AM, no stents were placed.  He may need more involved intervention, per son report of what vascular told him after the procedure.  Pt denies complaints at this time. Hopes to go home tomorrow.   Physical Exam: Vitals:   07/04/24 1230 07/04/24 1245 07/04/24 1255 07/04/24 1300  BP: (!) 157/72 (!) 158/62  (!) 165/65  Pulse:   65 65  Resp: 15 15  20 17  Temp:   98.3 F (36.8 C)   TempSrc:      SpO2: 95% 95% 97% 93%  Weight:      Height:       General exam: awake, alert, no acute distress HEENT: moist mucus membranes, hearing grossly normal  Respiratory system: CTAB, no wheezes, rales or rhonchi, normal respiratory effort. Cardiovascular  system: normal S1/S2, RRR, +systolic murmur, no pedal edema.   Gastrointestinal system: soft, NT, ND Central nervous system: A&O x2+. no gross focal neurologic deficits, normal speech Extremities: LLE pitting edema, no RLE edema, BLE venous stasis changes Skin: dry, intact, normal temperature Psychiatry: normal mood, congruent affect, judgement and insight appear normal   Data Reviewed:  Notable labs -- Cr 1.84 stable, bicarb 21, BUN 44, Hbg stable 11.3, platelets 85k  Family Communication: son at bedside on rounds  Disposition: Status is: Inpatient Remains inpatient appropriate because: d/c pending clearance by vascular surgery   Planned Discharge Destination: Home with Home Health    Time spent: 42 minutes  Author: Burnard DELENA Cunning, DO 07/04/2024 2:34 PM  For on call review [email or web address].

## 2024-07-04 NOTE — Progress Notes (Signed)
 Dr. Marea in at bedside, speaking with pt. And his son re: procedural results. Both verbalized understanding of conversation with MD.

## 2024-07-04 NOTE — Op Note (Signed)
 [facility]  Percutaneous Study/Intervention Procedural Note   Date of Surgery: 07/04/2024  Surgeon(s):Tynika Luddy    Assistants:none  Pre-operative Diagnosis: PAD with claudication left lower extremity  Post-operative diagnosis:  Same  Procedure(s) Performed:             1.  Ultrasound guidance for vascular access right femoral artery             2.  Catheter placement into left common iliac artery (left limb of the aortobiiliac stent graft) from right femoral approach             3.  Aortogram and selective left lower extremity angiogram             4.  StarClose closure device right femoral artery  EBL: 5 cc  Contrast: 40 cc  Fluoro Time: 3.8 minutes  Moderate Conscious Sedation Time: approximately 21 minutes using 1 mg of Versed  and 50 mcg of Fentanyl               Indications:  Patient is a 88 y.o.male with claudication symptoms of the left lower extremity.  He has already undergone aortoiliac stent graft repair as well as a left renal artery stent.  He has marked renal insufficiency.  Risks and benefits are discussed and informed consent is obtained.   Procedure:  The patient was identified and appropriate procedural time out was performed.  The patient was then placed supine on the table and prepped and draped in the usual sterile fashion. Moderate conscious sedation was administered during a face to face encounter with the patient throughout the procedure with my supervision of the RN administering medicines and monitoring the patient's vital signs, pulse oximetry, telemetry and mental status throughout from the start of the procedure until the patient was taken to the recovery room. Ultrasound was used to evaluate the right common femoral artery.  It was patent and appeared somewhat aneurysmal in the midsegment.  A digital ultrasound image was acquired.  A Seldinger needle was used to access the distal right common femoral artery below the aneurysm under  direct ultrasound guidance and a permanent image was performed.  A 0.035 J wire was advanced without resistance and a 5Fr sheath was placed.  Pigtail catheter was placed into the aorta and an AP aortogram was performed. This demonstrated at least moderate restenosis of the left renal artery stent.  The previous aortoiliac stent graft was patent and there was not an obvious endoleak and no flow into the aneurysm sac.  There was a patent stent in the right external iliac artery with a patent right internal iliac artery.  The left internal iliac artery was occluded with a patent stent graft into the left external iliac artery.  I then used a V S1 catheter to hook the flow divider of the aortoiliac stent graft and get the catheter into the left limb of the stent graft analogous to the left common iliac artery.  Selective left lower extremity angiogram was then performed. This demonstrated significant disease in the left common femoral artery in the proximal portion of the profunda femoris artery with a greater than 75% stenosis.  The SFA had a near flush occlusion was occluded all the way down to the mid popliteal artery.  The only tibial runoff was a large peroneal artery that did not have any obvious disease on angiography. I elected to terminate the procedure.  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  The sheath was removed and StarClose closure device was deployed in the right femoral artery with excellent hemostatic result. The patient was taken to the recovery room in stable condition having tolerated the procedure well.  Findings:               Aortogram:  This demonstrated at least moderate restenosis of the left renal artery stent.  The previous aortoiliac stent graft was patent and there was not an obvious endoleak and no flow into the aneurysm sac.  There was a patent stent in the right external  iliac artery with a patent right internal iliac artery.  The left internal iliac artery was occluded with a patent stent graft into the left external iliac artery.             Left lower Extremity:  This demonstrated significant disease in the left common femoral artery in the proximal portion of the profunda femoris artery with a greater than 75% stenosis.  The SFA had a near flush occlusion was occluded all the way down to the mid popliteal artery.  The only tibial runoff was a large peroneal artery that did not have any obvious disease on angiography.   Disposition: Patient was taken to the recovery room in stable condition having tolerated the procedure well.  Complications: None  Selinda Gu 07/04/2024 12:02 PM   This note was created with Dragon Medical transcription system. Any errors in dictation are purely unintentional.

## 2024-07-04 NOTE — Progress Notes (Signed)
 PHARMACY - ANTICOAGULATION CONSULT NOTE  Pharmacy Consult for heparin  Indication: DVT  Allergies  Allergen Reactions   Oxycodone  Nausea Only   Lisinopril Swelling   Tetanus Toxoids Swelling    Patient Measurements: Height: 5' 10 (177.8 cm) Weight: 68 kg (149 lb 14.6 oz) IBW/kg (Calculated) : 73 HEPARIN  DW (KG): 68  Vital Signs: Temp: 98.1 F (36.7 C) (08/21 0538) Temp Source: Oral (08/21 0538) BP: 152/72 (08/21 0538) Pulse Rate: 56 (08/21 0538)  Labs: Recent Labs    07/02/24 0451 07/02/24 0826 07/02/24 1617 07/03/24 0441 07/03/24 0931 07/03/24 2111 07/04/24 0419  HGB 11.2*  --   --  11.3*  --   --  11.2*  HCT 33.7*  --   --  34.8*  --   --  34.7*  PLT 79*  --   --  84*  --   --  85*  APTT  --  67*   < >  --  56* 50* 83*  HEPARINUNFRC  --  1.02*  --   --  0.65  --  0.57  CREATININE 1.74*  --   --  1.80*  --   --  1.84*   < > = values in this interval not displayed.    Estimated Creatinine Clearance: 28.2 mL/min (A) (by C-G formula based on SCr of 1.84 mg/dL (H)).   Medical History: Past Medical History:  Diagnosis Date   Abdominal aortic aneurysm (HCC)    monitored by dr. bosie   Abdominal aortic aneurysm (HCC)    Cancer (HCC) 1976   melanoma, under right arm, removed   Chronic kidney disease 2011   stent right kidney   Coronary artery disease    Diabetes mellitus without complication (HCC)    Hypertension    Jaw fracture (HCC)    Myocardial infarction (HCC)    Peripheral vascular disease (HCC)    Renal artery stenosis (HCC)    Stroke [facility])    Assessment: 88 y/o male presenting with swelling, redness, and pain in the left lower extremity - found to have complete occlusion of left superficial femoral artery. PMH significant for abdominal aortic aneurysm, T2DM, atrial fibrillation on Eliquis , seizures, PAD, CKD III, HTN, stroke (2019, 2020). Pharmacy has been consulted to initiate and manage heparin  infusion.  Last dose of Eliquis : 5 mg on 06/30/24 in  AM Baseline labs: hgb 13.6, plt 109, HL>1.10, aPTT 37, INR 1.4  Given recent use of apixaban , and elevated baseline heparin  level, will monitor with aPTT levels until correlating with heparin  levels.   8/18 @ 0232:  aPTT = 130,  HL = > 1.10 8/18 @ 1308: aPTT= 126 8/18 2306 aPTT = 108, supratherapeutic 8/19 0826 aPTT = 67, therapeutic x 1, not correlating with HL 1.02 8/19 1617 aPTT = 59, subtherapeutic 8/20 0043 aPTT = 80. Therapeutic x 1 8/20 0931 aPTT = 56, SUBtherapeutic, not correlating with HL 0.65  8/20 2111 aPTT = 50, SUBtherapeutic 8/21 0419 aPTT = 83, therapeutic x 1 / HL 0.57  Goal of Therapy:  No boluses Heparin  level 0.3-0.5 units/ml aPTT 66-85 sec Monitor platelets by anticoagulation protocol: Yes    Plan:  Per MD, will not give heparin  boluses  - Continue heparin  infusion rate at 950 units/hr - Recheck aPTT 8 hours to confirm - Recheck HL daily  - Monitor aPTT levels until correlating with heparin  levels, then can transition to monitoring with heparin  levels only - Monitor CBC and signs/symptoms of bleeding  Thank you for involving pharmacy  in this patient's care.   Rankin CANDIE Dills, PharmD, [facility] 07/04/2024 6:25 AM

## 2024-07-04 NOTE — Progress Notes (Signed)
 Physical Therapy Treatment Patient Details Name: Nathaniel Gamble MRN: [id number] DOB: 09-21-1936 Today's Date: 07/04/2024   History of Present Illness Pt is an 88 y.o. male with a known history of abdominal aortic aneurysm, melanoma, CKD 3B with secondary hyperparathyroidism, CAD status post MI and CVA, diabetes, hypertension, intracranial hemorrhage 4 years ago, peripheral vascular disease on Eliquis , renal artery stenosis presents to the emergency department for evaluation of lower extremity swelling. MD assessment includes: ischemia of left lower extremity with complete occlusion of left superficial femoral artery and complete occlusion of the posterior tibial artery, L ankle swelling with imaging negative, AKI, and thrombocytopenia.    PT Comments  Pt was pleasant and motivated to participate during the session and put forth good effort throughout. Pt required cuing for sequencing during transfer training for decreased effort to come to standing including for hand placement and increased trunk flexion.  Pt was able to amb 150 feet with slow cadence and without the use of an AD per pt request.  Pt was generally steady during gait with short bilateral step lengths but with no overt LOB including during start/stops and 180 deg turns.  Pt participated in dynamic standing balance training with various foot positions with only occasional mild sway but no LOB.  Pt will benefit from continued PT services upon discharge to safely address deficits listed in patient problem list for decreased caregiver assistance and eventual return to PLOF.      If plan is discharge home, recommend the following: A little help with walking and/or transfers;A little help with bathing/dressing/bathroom;Assistance with cooking/housework;Assist for transportation;Help with stairs or ramp for entrance   Can travel by private vehicle        Equipment Recommendations  Rolling walker (2 wheels);Other (comment) (Pt declined RW)     Recommendations for Other Services       Precautions / Restrictions Precautions Precautions: Fall Restrictions Weight Bearing Restrictions Per Provider Order: No     Mobility  Bed Mobility               General bed mobility comments: NT, pt in chair pre session    Transfers Overall transfer level: Needs assistance Equipment used: None Transfers: Sit to/from Stand Sit to Stand: Supervision           General transfer comment: Cuing for increased trunk flexion and hand placement but no physical assist needed to come to standing    Ambulation/Gait Ambulation/Gait assistance: Supervision Gait Distance (Feet): 150 Feet Assistive device: None Gait Pattern/deviations: Step-through pattern, Decreased step length - right, Decreased step length - left, Trunk flexed Gait velocity: decreased     General Gait Details: Very slow cadence with short B step length but generally steady with no overt LOB including with start/stops and 180 deg turns   Optometrist     Tilt Bed    Modified Rankin (Stroke Patients Only)       Balance Overall balance assessment: Needs assistance   Sitting balance-Leahy Scale: Good     Standing balance support: No upper extremity supported, During functional activity Standing balance-Leahy Scale: Good                              Communication Communication Communication: No apparent difficulties  Cognition Arousal: Alert Behavior During Therapy: WFL for tasks assessed/performed   PT - Cognitive impairments: No apparent impairments  Following commands: Intact      Cueing Cueing Techniques: Verbal cues, Tactile cues  Exercises Other Exercises Other Exercises: Dynamic standing unsupported balance training with feet apart and more narrow with reaching outside BOS    General Comments        Pertinent Vitals/Pain Pain Assessment Pain  Assessment: No/denies pain    Home Living                          Prior Function            PT Goals (current goals can now be found in the care plan section) Progress towards PT goals: Progressing toward goals    Frequency    Min 2X/week      PT Plan      Co-evaluation              AM-PAC PT 6 Clicks Mobility   Outcome Measure  Help needed turning from your back to your side while in a flat bed without using bedrails?: A Little Help needed moving from lying on your back to sitting on the side of a flat bed without using bedrails?: A Little Help needed moving to and from a bed to a chair (including a wheelchair)?: A Little Help needed standing up from a chair using your arms (e.g., wheelchair or bedside chair)?: A Little Help needed to walk in hospital room?: A Little Help needed climbing 3-5 steps with a railing? : A Little 6 Click Score: 18    End of Session Equipment Utilized During Treatment: Gait belt Activity Tolerance: Patient tolerated treatment well Patient left: Other (comment) (Pt left standing at sink with CNA for shaving) Nurse Communication: Mobility status PT Visit Diagnosis: History of falling (Z91.81);Difficulty in walking, not elsewhere classified (R26.2);Muscle weakness (generalized) (M62.81)     Time: 9093-9070 PT Time Calculation (min) (ACUTE ONLY): 23 min  Charges:    $Gait Training: 8-22 mins $Therapeutic Exercise: 8-22 mins PT General Charges $$ ACUTE PT VISIT: 1 Visit                    D. Scott Chosen Garron PT, DPT 07/04/24, 9:45 AM

## 2024-07-04 NOTE — Interval H&P Note (Signed)
 History and Physical Interval Note:  07/04/2024 10:46 AM  Nathaniel Gamble  has presented today for surgery, with the diagnosis of PAD.  The various methods of treatment have been discussed with the patient and family. After consideration of risks, benefits and other options for treatment, the patient has consented to  Procedure(s): Lower Extremity Angiography (Left) as a surgical intervention.  The patient's history has been reviewed, patient examined, no change in status, stable for surgery.  I have reviewed the patient's chart and labs.  Questions were answered to the patient's satisfaction.     Tarrin Lebow

## 2024-07-05 DIAGNOSIS — E1322 Other specified diabetes mellitus with diabetic chronic kidney disease: Secondary | ICD-10-CM

## 2024-07-05 DIAGNOSIS — Z95828 Presence of other vascular implants and grafts: Secondary | ICD-10-CM

## 2024-07-05 DIAGNOSIS — I70202 Unspecified atherosclerosis of native arteries of extremities, left leg: Secondary | ICD-10-CM | POA: Diagnosis not present

## 2024-07-05 DIAGNOSIS — I714 Abdominal aortic aneurysm, without rupture, unspecified: Secondary | ICD-10-CM | POA: Diagnosis not present

## 2024-07-05 DIAGNOSIS — I998 Other disorder of circulatory system: Secondary | ICD-10-CM | POA: Diagnosis not present

## 2024-07-05 DIAGNOSIS — Z9889 Other specified postprocedural states: Secondary | ICD-10-CM

## 2024-07-05 DIAGNOSIS — N1832 Chronic kidney disease, stage 3b: Secondary | ICD-10-CM

## 2024-07-05 DIAGNOSIS — M79605 Pain in left leg: Secondary | ICD-10-CM | POA: Diagnosis not present

## 2024-07-05 LAB — BASIC METABOLIC PANEL WITH GFR
Anion gap: 10 (ref 5–15)
BUN: 39 mg/dL — ABNORMAL HIGH (ref 8–23)
CO2: 21 mmol/L — ABNORMAL LOW (ref 22–32)
Calcium: 9.7 mg/dL (ref 8.9–10.3)
Chloride: 107 mmol/L (ref 98–111)
Creatinine, Ser: 2.4 mg/dL — ABNORMAL HIGH (ref 0.61–1.24)
GFR, Estimated: 26 mL/min — ABNORMAL LOW (ref >60)
Glucose, Bld: 108 mg/dL — ABNORMAL HIGH (ref 70–99)
Potassium: 4.9 mmol/L (ref 3.5–5.1)
Sodium: 138 mmol/L (ref 135–145)

## 2024-07-05 LAB — CBC
HCT: 39.9 % (ref 39.0–52.0)
Hemoglobin: 12.7 g/dL — ABNORMAL LOW (ref 13.0–17.0)
MCH: 30.2 pg (ref 26.0–34.0)
MCHC: 31.8 g/dL (ref 30.0–36.0)
MCV: 95 fL (ref 80.0–100.0)
Platelets: 112 K/uL — ABNORMAL LOW (ref 150–400)
RBC: 4.2 MIL/uL — ABNORMAL LOW (ref 4.22–5.81)
RDW: 14.9 % (ref 11.5–15.5)
WBC: 6.1 K/uL (ref 4.0–10.5)
nRBC: 0 % (ref 0.0–0.2)

## 2024-07-05 LAB — GLUCOSE, CAPILLARY
Glucose-Capillary: 103 mg/dL — ABNORMAL HIGH (ref 70–99)
Glucose-Capillary: 156 mg/dL — ABNORMAL HIGH (ref 70–99)

## 2024-07-05 MED ORDER — SODIUM CHLORIDE 0.9 % IV SOLN: INTRAVENOUS | Status: DC

## 2024-07-05 NOTE — Plan of Care (Signed)
  Problem: Education: Goal: Knowledge of General Education information will improve Description: Including pain rating scale, medication(s)/side effects and non-pharmacologic comfort measures Outcome: Progressing   Problem: Health Behavior/Discharge Planning: Goal: Ability to manage health-related needs will improve Outcome: Progressing   Problem: Clinical Measurements: Goal: Ability to maintain clinical measurements within normal limits will improve Outcome: Progressing   Problem: Clinical Measurements: Goal: Will remain free from infection Outcome: Progressing   Problem: Clinical Measurements: Goal: Diagnostic test results will improve Outcome: Progressing   Problem: Clinical Measurements: Goal: Respiratory complications will improve Outcome: Completed/Met

## 2024-07-05 NOTE — Progress Notes (Signed)
 Progress Note    07/05/2024 1:00 PM 1 Day Post-Op  Subjective:  Nathaniel Gamble is an 88 yo male abdominal aortic aneurysm status post repair, diabetes type 2, seizures like activity, peripheral artery disease, stage IIIb chronic kidney disease, hypertensive chronic kidney disease, renal artery stenosis, secondary hyperparathyroidism of renal origin, on Eliquis  who presents to the emergency department with sudden onset left lower extremity pain.   Patient is now POD #1 from:  Procedure(s) Performed:             1.  Ultrasound guidance for vascular access right femoral artery             2.  Catheter placement into left common iliac artery (left limb of the aortobiiliac stent graft) from right femoral approach             3.  Aortogram and selective left lower extremity angiogram             4.  StarClose closure device right femoral artery  Patient is resting comfortably today.  Recovering as expected postprocedure.  Right groin puncture site clean dry and intact.  No complications overnight.  Vitals are remained stable.   Vitals:   07/05/24 0348 07/05/24 0751  BP: (!) 151/59 (!) 144/61  Pulse: 79 72  Resp: 20 18  Temp: 98.1 F (36.7 C) 97.8 F (36.6 C)  SpO2: 97% 98%   Physical Exam: Cardiac:  RRR, normal S1 and S2.  No murmurs. Lungs: Rhonchorous throughout and diminished in the bases.  Nonlabored breathing. Incisions: Right groin puncture site with dressing clean dry and intact.  No hematoma seroma. Extremities: Bilateral lower extremities warm to touch.  Left lower extremity swollen ankle and foot. Abdomen: Positive bowel sounds throughout, soft, nontender nondistended. Neurologic: Alert and oriented x 3, answers all questions follows commands appropriately.  CBC    Component Value Date/Time   WBC 6.1 07/05/2024 0349   RBC 4.20 (L) 07/05/2024 0349   HGB 12.7 (L) 07/05/2024 0349   HCT 39.9 07/05/2024 0349   PLT 112 (L) 07/05/2024 0349   MCV 95.0 07/05/2024 0349    MCH 30.2 07/05/2024 0349   MCHC 31.8 07/05/2024 0349   RDW 14.9 07/05/2024 0349   LYMPHSABS 1.2 06/30/2024 1600   MONOABS 0.9 06/30/2024 1600   EOSABS 0.1 06/30/2024 1600   BASOSABS 0.0 06/30/2024 1600   BASOSABS 0 05/14/2012 1000    BMET    Component Value Date/Time   NA 138 07/05/2024 0349   K 4.9 07/05/2024 0349   CL 107 07/05/2024 0349   CO2 21 (L) 07/05/2024 0349   GLUCOSE 108 (H) 07/05/2024 0349   BUN 39 (H) 07/05/2024 0349   CREATININE 2.40 (H) 07/05/2024 0349   CALCIUM  9.7 07/05/2024 0349   GFRNONAA 26 (L) 07/05/2024 0349   GFRAA 42 (L) 06/07/2020 1929    INR    Component Value Date/Time   INR 1.4 (H) 06/30/2024 1625     Intake/Output Summary (Last 24 hours) at 07/05/2024 1300 Last data filed at 07/05/2024 0900 Gross per 24 hour  Intake 281.25 ml  Output --  Net 281.25 ml     Assessment/Plan:  88 y.o. male is s/p SEE ABOVE 1 Day Post-Op   PLAN Patient has significant disease in the left common femoral artery in the proximal portion of the profunda femoris artery with a greater than 75% stenosis.  The SFA had a near flush occlusion was occluded all the way down to the mid popliteal artery.  The only tibial runoff was a large peroneal artery that did not have any obvious disease on angiography.   Therefore patient will require surgery to fix his disease. No other intervention is planned at this time. We will follow up with patient in clinic with Dr Marea to discuss any options going forward to help with his arterial disease.    DVT prophylaxis:  Eliquis  2.5 mg BID and ASA 81 mg daily.    [name] [facility] 07/05/2024 1:00 PM

## 2024-07-05 NOTE — Progress Notes (Signed)
 Discharge instructions reviewed with the patient and his son Toney. Staff attempted to make follow up appointment with [facility] but clinic was closed. Patient pushed out by wheelchair with belongings.

## 2024-07-05 NOTE — Progress Notes (Addendum)
 Mobility Specialist - Progress Note   07/05/24 1103  Mobility  Activity Ambulated with assistance  Level of Assistance Standby assist, set-up cues, supervision of patient - no hands on  Assistive Device Front wheel walker  Distance Ambulated (ft) 40 ft  Activity Response Tolerated well  Mobility visit 1 Mobility  Mobility Specialist Start Time (ACUTE ONLY) 1047  Mobility Specialist Stop Time (ACUTE ONLY) 1053  Mobility Specialist Time Calculation (min) (ACUTE ONLY) 6 min   Pt sitting in the chair upon entry, utilizing RA. Pt expressed feeling well today, and awaiting d/c. Pt STS to RW and amb ~ 40 ft in the hallway MinG-SBA-- slow gait, short steps. Pt returned to the room, left seated in the chair with needs within reach. Family/friend present at bedside.  America Silvan Mobility Specialist 07/05/24 11:05 AM

## 2024-07-05 NOTE — Discharge Summary (Signed)
 Physician Discharge Summary   Patient: Nathaniel Gamble MRN: [id number] DOB: [birth year]  Admit date:     06/30/2024  Discharge date: 07/05/2024  Discharge Physician: Burnard DELENA Cunning   PCP: Rudolpho Norleen BIRCH, MD   Recommendations at discharge:    Follow up with Vascular Surgery Follow up with Nephrology Follow up with Primary Care Repeat BMP within 3-5 days, CBC routinely  Discharge Diagnoses: Principal Problem:   Ischemic leg Active Problems:   Left ankle swelling   Acute kidney injury superimposed on CKD (HCC)   Essential hypertension   History of CAD (coronary artery disease)   Hyperlipidemia, unspecified   Thrombocytopenia (HCC)   ASO (arteriosclerosis obliterans)   Pain of left lower extremity  Resolved Problems:   * No resolved hospital problems. *  Hospital Course:  88 y.o. male with a known history of abdominal aortic aneurysm, melanoma, CKD 3B with secondary hyperparathyroidism, CAD status post MI and CVA, diabetes, hypertension, intracranial hemorrhage 4 years ago, peripheral vascular disease on Eliquis , renal artery stenosis presents to the emergency department for evaluation of lower extremity swelling.  Patient was in a usual state of health until this morning at 5:30 AM he woke up with sudden onset of severe left lower extremity pain and swelling, noticed discoloration of his left second toe.  Patient reports that his pain is significantly improved   Patient denies fevers/chills, weakness, dizziness, chest pain, shortness of breath, N/V/C/D, abdominal pain, dysuria/frequency, changes in mental status.    Otherwise there has been no change in status. Patient has been taking medication as prescribed and there has been no recent change in medication or diet.  No recent antibiotics.  There has been no recent illness, hospitalizations, travel or sick contacts.     EMS/ED Course: Patient received Tylenol , normal saline, heparin  infusion without bolus. Medical admission  has been requested for further management of ischemic limb.   8/18.  Patient states his foot was red yesterday but not red today.  Patient feels okay.  He states that his foot was on a pillow and slid off and landed on the ground.  Patient was brought in with swelling of his ankle.  Arterial ultrasound shows a complete occlusion of the left superficial femoral artery and complete occlusion of the posterior tibial artery.  8/19.  Case discussed with vascular surgery and they will plan on doing an angiogram either on Thursday or Friday.  Evidence of moderate bilateral lower extremity peripheral vascular disease with resting ankle-brachial index as of 0.59 on the right and 0.6 on the left.     I assumed care on 07/03/24. Further hospital course and management as outlined below.  8/21 -- pt underwent LLE angiogram with vascular - see OpNote.  Findings not amenable to re-vascularization, but would require more extensive surgery.  8/22 -- pt doing well post-angiogram yesterday.  Pt is adamant to go home today.  We discussed his renal function worsened after contrast given in procedure yesterday, that recommendation would be to remain in hospital for IV fluids and repeat labs to ensure kidneys are recovering.  Pt declined this and stated he would get outpatient labs done within a few days and drink plenty of fluids in the meantime.     Assessment and Plan:  * Ischemia of left lower extremity Vascular ultrasound showed complete occlusion of left superficial femoral artery and complete occlusion of the posterior tibial artery.  ABI low at 0.6 left leg and 0.59 right leg. --Treated with IV heparin  drip --  Holding Eliquis  - resume at discharge --Continue aspirin  --Vascular surgery consulted - see notes & angiogram Op Note  --Pain control PRN --Follow up outpatient with Vascular Surgery to discuss surgical options    Left ankle swelling - improved Continue to monitor at follow up.  X-ray negative.   Uric acid normal range.   AKI superimposed on CKD stage IIIb AKI had resolved - Cr 2.31 on admission Cr now 1.7-1.8 likely his baseline, but now AKI recurrent day 1 post angiogram --Given IV fluids today --Pt declines to stay in hospital for more IV fluids or labs --Close outpatient follow up in 2-3 days for labs --Encouraged pt to drink plenty of fluids --Also discussed this with patient's son, who agreed with pt's decision to proceed with discharge home today --Renally dose meds, avoid nephrotoxins & hypotension   Essential hypertension --On amlodipine  Coreg  and hydralazine  --Resumed on losartan , initially held for AKI   History of CAD (coronary artery disease) --On aspirin    Hyperlipidemia, unspecified --On simvastatin    Thrombocytopenia (HCC) Hepatitis C negative.  Seems chronic. --Monitor CBC         Consultants: as above Procedures performed: as above  Disposition: Home (pt declined HH PT) Diet recommendation:  Discharge Diet Orders (From admission, onward)     Start     Ordered   07/05/24 0000  Diet - low sodium heart healthy        07/05/24 1420            DISCHARGE MEDICATION: Allergies as of 07/05/2024       Reactions   Oxycodone  Nausea Only   Lisinopril Swelling   Tetanus Toxoids Swelling        Medication List     PAUSE taking these medications    losartan  100 MG tablet Wait to take this until your doctor or other care provider tells you to start again. Commonly known as: COZAAR  Take 100 mg by mouth daily.       STOP taking these medications    tobramycin 0.3 % ophthalmic solution Commonly known as: TOBREX       TAKE these medications    acetaminophen  325 MG tablet Commonly known as: TYLENOL  Take 1-2 tablets (325-650 mg total) by mouth every 4 (four) hours as needed for mild pain (or temp >/= 101 F).   amLODipine  5 MG tablet Commonly known as: NORVASC  Take 5 mg by mouth daily.   aspirin  EC 81 MG tablet Take 1 tablet  (81 mg total) by mouth daily at 6 (six) AM.   carvedilol  25 MG tablet Commonly known as: COREG  Take 25 mg by mouth 2 (two) times daily with a meal.   cyanocobalamin  1000 MCG tablet Commonly known as: VITAMIN B12 Take 1,000 mcg by mouth daily.   Eliquis  2.5 MG Tabs tablet Generic drug: apixaban  Take 2.5 mg by mouth 2 (two) times daily.   Farxiga  10 MG Tabs tablet Generic drug: dapagliflozin  propanediol Take 10 mg by mouth daily.   hydrALAZINE  10 MG tablet Commonly known as: APRESOLINE  Take 1 tablet by mouth at bedtime.   ISOSOURCE PO Take by mouth 5 (five) times daily.   multivitamin-lutein  Caps capsule Take 1 capsule by mouth daily.   simvastatin  40 MG tablet Commonly known as: ZOCOR  Take 40 mg by mouth daily at 6 PM.   Vitamin D  (Ergocalciferol ) 1.25 MG (50000 UNIT) Caps capsule Commonly known as: DRISDOL  Take 50,000 Units by mouth once a week.        Follow-up Information  Rudolpho Norleen BIRCH, MD. Schedule an appointment as soon as possible for a visit.   Specialty: Internal Medicine Why: Needs repeat labs early next week to check renal function.  Hospital follow up. Contact information: [address] [facility] [place] [address] [phone]         Marea Selinda RAMAN, MD. Call.   Specialties: Vascular Surgery, Radiology, Interventional Cardiology Why: Call next week to confirm next follow up appointment Contact information: [address] [place] [address] [phone]                Discharge Exam: Filed Weights   06/30/24 1800 07/04/24 1010 07/05/24 0408  Weight: 68 kg 68 kg 69 kg   General exam: awake, alert, no acute distress HEENT: moist mucus membranes, hearing grossly normal  Respiratory system: CTA, no wheezes, rales or rhonchi, normal respiratory effort. Cardiovascular system: normal S1/S2, RRR   Gastrointestinal system: soft, NT, ND Central nervous system: A&O x 4. no gross focal neurologic  deficits, normal speech Extremities: moves all, no edema, normal tone Skin: dry, intact, normal temperature Psychiatry: normal mood, congruent affect, judgement and insight appear normal'  Condition at discharge: stable  The results of significant diagnostics from this hospitalization (including imaging, microbiology, ancillary and laboratory) are listed below for reference.   Imaging Studies: PERIPHERAL VASCULAR CATHETERIZATION Result Date: 07/04/2024 See surgical note for result.  US  ARTERIAL ABI (SCREENING LOWER EXTREMITY) Result Date: 07/01/2024 CLINICAL DATA:  Peripheral vascular disease and claudication. Left lower extremity arterial duplex ultrasound demonstrating left SFA occlusive disease. EXAM: NONINVASIVE PHYSIOLOGIC VASCULAR STUDY OF BILATERAL LOWER EXTREMITIES TECHNIQUE: Evaluation of both lower extremities were performed at rest, including calculation of ankle-brachial indices with single level Doppler, pressure and pulse volume recording. COMPARISON:  Left lower extremity arterial duplex ultrasound on 06/30/2024 FINDINGS: Right ABI:  0.59 Left ABI:  0.60 Right Lower Extremity: Monophasic posterior tibial and dorsalis pedis waveforms. Left Lower Extremity: Monophasic posterior tibial and dorsalis pedis waveforms. 0.5-0.79 Moderate PAD IMPRESSION: Evidence of moderate bilateral lower extremity peripheral vascular disease with resting ankle-brachial indices of 0.59 on the right and 0.60 on the left. Monophasic distal waveforms bilaterally. Electronically Signed   By: Marcey Moan M.D.   On: 07/01/2024 13:46   DG Ankle 2 Views Left Result Date: 06/30/2024 CLINICAL DATA:  Ankle pain swelling EXAM: LEFT ANKLE - 2 VIEW COMPARISON:  None Available. FINDINGS: Generalized soft tissue swelling. No acute fracture or malalignment. Medial and lateral degenerative changes. Ossicle or remote injury at the tip of medial malleolus. IMPRESSION: Soft tissue swelling with degenerative changes. No acute  osseous abnormality. Electronically Signed   By: Luke Bun M.D.   On: 06/30/2024 19:19   US  ARTERIAL LOWER EXTREMITY DUPLEX LEFT (NON-ABI) Result Date: 06/30/2024 CLINICAL DATA:  Ischemic toe EXAM: LEFT LOWER EXTREMITY ARTERIAL DUPLEX SCAN TECHNIQUE: Gray-scale sonography as well as color Doppler and duplex ultrasound was performed to evaluate the lower extremity arteries including the common, superficial and profunda femoral arteries, popliteal artery and calf arteries. COMPARISON:  None Available. FINDINGS: Left lower Extremity Inflow: Normal common femoral arterial waveforms and velocities. No evidence of inflow (aortoiliac) disease. Outflow: Focal occlusion of the superficial femoral artery in the mid thigh. The vessel reconstitutes as the popliteal artery. Abnormal post occlusive delayed waveforms in the popliteal artery. Atherosclerotic plaque results in significantly elevated peak systolic velocities in the proximal profunda femoral artery consistent with greater than 60% stenosis. Runoff: Abnormal monophasic arterial waveforms in the anterior tibial artery.  The posterior tibial artery appears occluded. IMPRESSION: 1. Complete occlusion of the left superficial femoral artery in the mid and distal thigh. 2. Greater than 60% diameter stenosis of the origins of the profunda femoral artery. 3. Complete occlusion of the posterior tibial artery. Electronically Signed   By: Wilkie Lent M.D.   On: 06/30/2024 17:52   CT Head Wo Contrast Result Date: 06/30/2024 CLINICAL DATA:  Will likely need to start heparin  drip, prior history of ICH EXAM: CT HEAD WITHOUT CONTRAST TECHNIQUE: Contiguous axial images were obtained from the base of the skull through the vertex without intravenous contrast. RADIATION DOSE REDUCTION: This exam was performed according to the departmental dose-optimization program which includes automated exposure control, adjustment of the mA and/or kV according to patient size and/or use  of iterative reconstruction technique. COMPARISON:  CT head June 5, 25. FINDINGS: Brain: No evidence of acute infarction, hemorrhage, hydrocephalus, extra-axial collection or mass lesion/mass effect. Remote right posterior MCA territory infarcts, unchanged. Vascular: Calcific atherosclerosis.  No hyperdense vessel. Skull: No acute fracture. Sinuses/Orbits: Clear sinuses.  No acute orbital findings. Other: No mastoid effusions. IMPRESSION: 1. No evidence of acute intracranial abnormality. 2. Unchanged remote right posterior MCA territory infarcts. Electronically Signed   By: Gilmore GORMAN Molt M.D.   On: 06/30/2024 17:20   DG Chest 2 View Result Date: 06/30/2024 CLINICAL DATA:  Infection. Bilateral feet and ankle swelling. Difficulty walking. EXAM: CHEST - 2 VIEW COMPARISON:  None Available. FINDINGS: The heart is enlarged and the mediastinal contour is within normal limits. There is atherosclerotic calcification of the aorta. The pulmonary vasculature is distended. There is a small right pleural effusion. No pneumothorax is seen. Airspace disease is noted in the lingular segment of the left upper lobe. A loop recorder device is present over the left chest. Sternotomy wires are noted over the midline. No acute osseous abnormality. IMPRESSION: 1. Cardiomegaly with distended pulmonary vasculature. 2. Airspace disease in the left upper lobe, possible atelectasis or infiltrate. 3. Small right pleural effusion. Electronically Signed   By: Leita Birmingham M.D.   On: 06/30/2024 17:01    Microbiology: Results for orders placed or performed during the hospital encounter of 12/25/19  MRSA PCR Screening     Status: None   Collection Time: 12/25/19 10:44 AM   Specimen: Nasopharyngeal  Result Value Ref Range Status   MRSA by PCR NEGATIVE NEGATIVE Final    Comment:        The GeneXpert MRSA Assay (FDA approved for NASAL specimens only), is one component of a comprehensive MRSA colonization surveillance program. It  is not intended to diagnose MRSA infection nor to guide or monitor treatment for MRSA infections. Performed at [facility], [address] [place], [place] [address]     Labs: CBC: Recent Labs  Lab 06/30/24 1600 07/01/24 0232 07/02/24 0451 07/03/24 0441 07/04/24 0419 07/05/24 0349  WBC 9.9 10.6* 5.6 5.0 4.1 6.1  NEUTROABS 7.6  --   --   --   --   --   HGB 13.6 12.0* 11.2* 11.3* 11.2* 12.7*  HCT 41.9 36.5* 33.7* 34.8* 34.7* 39.9  MCV 94.2 92.2 92.3 93.8 94.6 95.0  PLT 109* 91* 79* 84* 85* 112*   Basic Metabolic Panel: Recent Labs  Lab 07/01/24 0232 07/02/24 0451 07/03/24 0441 07/04/24 0419 07/05/24 0349  NA 139 136 140 141 138  K 4.7 4.5 4.5 4.8 4.9  CL 111 110 111 113* 107  CO2 20* 20* 21* 21* 21*  GLUCOSE 115*  89 100* 91 108*  BUN 36* 38* 44* 39* 39*  CREATININE 1.87* 1.74* 1.80* 1.84* 2.40*  CALCIUM  8.7* 8.5* 9.1 9.0 9.7   Liver Function Tests: Recent Labs  Lab 06/30/24 1600  AST 16  ALT 15  ALKPHOS 73  BILITOT 1.1  PROT 7.3  ALBUMIN 4.1   CBG: Recent Labs  Lab 07/04/24 1220 07/04/24 1613 07/04/24 2129 07/05/24 0755 07/05/24 1114  GLUCAP 90 90 102* 103* 156*    Discharge time spent: greater than 30 minutes.  Signed: Burnard DELENA Cunning, DO [facility] 07/05/2024
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
## 2024-07-16 ENCOUNTER — Encounter (INDEPENDENT_AMBULATORY_CARE_PROVIDER_SITE_OTHER): Payer: Self-pay | Admitting: Vascular Surgery

## 2024-07-16 ENCOUNTER — Ambulatory Visit (INDEPENDENT_AMBULATORY_CARE_PROVIDER_SITE_OTHER): Admitting: Vascular Surgery

## 2024-07-16 VITALS — BP 139/70 | HR 52 | Ht 70.0 in | Wt 150.0 lb

## 2024-07-16 DIAGNOSIS — I7143 Infrarenal abdominal aortic aneurysm, without rupture: Secondary | ICD-10-CM | POA: Diagnosis not present

## 2024-07-16 DIAGNOSIS — E119 Type 2 diabetes mellitus without complications: Secondary | ICD-10-CM | POA: Diagnosis not present

## 2024-07-16 DIAGNOSIS — E785 Hyperlipidemia, unspecified: Secondary | ICD-10-CM

## 2024-07-16 DIAGNOSIS — M7989 Other specified soft tissue disorders: Secondary | ICD-10-CM

## 2024-07-16 DIAGNOSIS — N1832 Chronic kidney disease, stage 3b: Secondary | ICD-10-CM

## 2024-07-16 DIAGNOSIS — I739 Peripheral vascular disease, unspecified: Secondary | ICD-10-CM

## 2024-07-16 DIAGNOSIS — I10 Essential (primary) hypertension: Secondary | ICD-10-CM

## 2024-07-16 NOTE — Assessment & Plan Note (Signed)
 Swelling has gone down but is still noticeable.  Would recommend continued compression and elevation is much as possible.  His activity is limited by his other comorbidities but he is still walking.

## 2024-07-16 NOTE — Assessment & Plan Note (Signed)
Can worsen LE swelling.

## 2024-07-16 NOTE — Progress Notes (Signed)
 MRN : [id number]  Nathaniel Gamble is a 88 y.o. (1935/11/27) male who presents with chief complaint of  Chief Complaint  Patient presents with   Follow-up    1 week follow , swelling has gone done in his ankle still having some in his leg on the left leg   .  History of Present Illness: Patient returns today in follow up of his PAD and left leg swelling.  His swelling has gone down although it remains noticeable in the left lower extremity.  He had a significant fall and the bruising has collected down into the lower leg area.  No open wounds.  This is not currently hurting.  He had an angiogram performed which did show significant disease on that side and his ABI had been reduced and known previously.  Revascularization would require femoral endarterectomy at a minimum and given his lack of symptoms other than swelling, I did not feel this would be helpful.  He does not have claudication.  He does not have rest pain.  He does not have ulceration.  Current Outpatient Medications  Medication Sig Dispense Refill   acetaminophen  (TYLENOL ) 325 MG tablet Take 1-2 tablets (325-650 mg total) by mouth every 4 (four) hours as needed for mild pain (or temp >/= 101 F).     amLODipine  (NORVASC ) 5 MG tablet Take 5 mg by mouth daily.     aspirin  EC 81 MG EC tablet Take 1 tablet (81 mg total) by mouth daily at 6 (six) AM.     carvedilol  (COREG ) 25 MG tablet Take 25 mg by mouth 2 (two) times daily with a meal.     cyanocobalamin  (VITAMIN B12) 1000 MCG tablet Take 1,000 mcg by mouth daily.     dapagliflozin  propanediol (FARXIGA ) 10 MG TABS tablet Take 10 mg by mouth daily.     ELIQUIS  2.5 MG TABS tablet Take 2.5 mg by mouth 2 (two) times daily.     hydrALAZINE  (APRESOLINE ) 10 MG tablet Take 1 tablet by mouth at bedtime.     [Paused] losartan  (COZAAR ) 100 MG tablet Take 100 mg by mouth daily.      multivitamin-lutein  (OCUVITE-LUTEIN ) CAPS capsule Take 1 capsule by mouth daily.     Nutritional Supplements  (ISOSOURCE PO) Take by mouth 5 (five) times daily.     simvastatin  (ZOCOR ) 40 MG tablet Take 40 mg by mouth daily at 6 PM.     Vitamin D , Ergocalciferol , (DRISDOL ) 50000 units CAPS capsule Take 50,000 Units by mouth once a week.   1   No current facility-administered medications for this visit.    Past Medical History:  Diagnosis Date   Abdominal aortic aneurysm (HCC)    monitored by dr. bosie   Abdominal aortic aneurysm (HCC)    Cancer (HCC) 1976   melanoma, under right arm, removed   Chronic kidney disease 2011   stent right kidney   Coronary artery disease    Diabetes mellitus without complication (HCC)    Hypertension    Jaw fracture (HCC)    Myocardial infarction [facility])    Peripheral vascular disease (HCC)    Renal artery stenosis (HCC)    Stroke [facility])     Past Surgical History:  Procedure Laterality Date   ABDOMINAL AORTIC ENDOVASCULAR STENT GRAFT N/A 12/25/2019   Procedure: ABDOMINAL AORTIC ENDOVASCULAR STENT GRAFT;  Surgeon: Marea Selinda RAMAN, MD;  Location: [facility] ORS;  Service: Vascular;  Laterality: N/A;   CATARACT EXTRACTION Bilateral 2014   CORONARY  ARTERY BYPASS GRAFT  2010   ENDOVASCULAR REPAIR/STENT GRAFT N/A 12/25/2019   Procedure: ENDOVASCULAR REPAIR/STENT GRAFT;  Surgeon: Marea Selinda RAMAN, MD;  Location: [facility] INVASIVE CV LAB;  Service: Cardiovascular;  Laterality: N/A;   INGUINAL HERNIA REPAIR Left 07/09/2015   Procedure: HERNIA REPAIR INGUINAL ADULT;  Surgeon: Larinda Unknown Sharps, MD;  Location: [facility] ORS;  Service: General;  Laterality: Left;   LOOP RECORDER INSERTION N/A 06/28/2018   Procedure: LOOP RECORDER INSERTION;  Surgeon: Ammon Blunt, MD;  Location: [facility] INVASIVE CV LAB;  Service: Cardiovascular;  Laterality: N/A;   LOWER EXTREMITY ANGIOGRAPHY Left 07/04/2024   Procedure: Lower Extremity Angiography;  Surgeon: Marea Selinda RAMAN, MD;  Location: [facility] INVASIVE CV LAB;  Service: Cardiovascular;  Laterality: Left;   MANDIBLE FRACTURE SURGERY Left    RENAL ARTERY  STENT Right      Social History   Tobacco Use   Smoking status: Former   Smokeless tobacco: Former  Substance Use Topics   Alcohol use: Yes    Alcohol/week: 1.0 standard drink of alcohol    Types: 1 Glasses of wine per week   Drug use: No      Family History  Problem Relation Age of Onset   Hyperlipidemia Mother    Stroke Mother    Hyperlipidemia Father      Allergies  Allergen Reactions   Oxycodone  Nausea Only   Lisinopril Swelling   Tetanus Toxoids Swelling      REVIEW OF SYSTEMS (Negative unless checked)   Constitutional: [] Weight loss  [] Fever  [] Chills Cardiac: [] Chest pain   [] Chest pressure   [] Palpitations   [] Shortness of breath when laying flat   [] Shortness of breath at rest   [] Shortness of breath with exertion. Vascular:  [] Pain in legs with walking   [] Pain in legs at rest   [] Pain in legs when laying flat   [] Claudication   [] Pain in feet when walking  [] Pain in feet at rest  [] Pain in feet when laying flat   [] History of DVT   [] Phlebitis   [x] Swelling in legs   [] Varicose veins   [] Non-healing ulcers Pulmonary:   [] Uses home oxygen   [] Productive cough   [] Hemoptysis   [] Wheeze  [] COPD   [] Asthma Neurologic:  [] Dizziness  [] Blackouts   [] Seizures   [x] History of stroke   [] History of TIA  [] Aphasia   [] Temporary blindness   [] Dysphagia   [] Weakness or numbness in arms   [] Weakness or numbness in legs Musculoskeletal:  [x] Arthritis   [] Joint swelling   [x] Joint pain   [] Low back pain Hematologic:  [] Easy bruising  [] Easy bleeding   [] Hypercoagulable state   [] Anemic   Gastrointestinal:  [] Blood in stool   [] Vomiting blood  [] Gastroesophageal reflux/heartburn   [] Abdominal pain Genitourinary:  [x] Chronic kidney disease   [] Difficult urination  [] Frequent urination  [] Burning with urination   [] Hematuria Skin:  [] Rashes   [] Ulcers   [] Wounds Psychological:  [] History of anxiety   []  History of major depression.  Physical Examination  BP 139/70   Pulse  (!) 52   Ht 5' 10 (1.778 m)   Wt 150 lb (68 kg)   BMI 21.52 kg/m  Gen:  WD/WN, NAD Head: [place]/AT, No temporalis wasting. Ear/Nose/Throat: Hearing grossly intact, nares w/o erythema or drainage Eyes: Conjunctiva clear. Sclera non-icteric Neck: Supple.  Trachea midline Pulmonary:  Good air movement, no use of accessory muscles.  Cardiac: bradycardic Vascular:  Vessel Right Left  Radial Palpable Palpable  PT 1+ Palpable Not Palpable  DP 1+ Palpable Not Palpable   Gastrointestinal: soft, non-tender/non-distended. No guarding/reflex.  Musculoskeletal: M/S 5/5 throughout.  No deformity or atrophy. 1-2+ LLE edema. Neurologic: Sensation grossly intact in extremities.  Symmetrical.  Speech is fluent.  Psychiatric: Judgment intact, Mood & affect appropriate for pt's clinical situation. Dermatologic: No rashes or ulcers noted.  No cellulitis or open wounds.      Labs Recent Results (from the past 2160 hours)  Lactic acid, plasma     Status: None   Collection Time: 06/30/24  4:00 PM  Result Value Ref Range   Lactic Acid, Venous 1.2 0.5 - 1.9 mmol/L    Comment: Performed at [facility], [address] [place], [place] [address]  Comprehensive metabolic panel     Status: Abnormal   Collection Time: 06/30/24  4:00 PM  Result Value Ref Range   Sodium 139 135 - 145 mmol/L   Potassium 5.3 (H) 3.5 - 5.1 mmol/L   Chloride 106 98 - 111 mmol/L   CO2 22 22 - 32 mmol/L   Glucose, Bld 119 (H) 70 - 99 mg/dL    Comment: Glucose reference range applies only to samples taken after fasting for at least 8 hours.   BUN 45 (H) 8 - 23 mg/dL   Creatinine, Ser 7.68 (H) 0.61 - 1.24 mg/dL   Calcium  9.6 8.9 - 10.3 mg/dL   Total Protein 7.3 6.5 - 8.1 g/dL   Albumin 4.1 3.5 - 5.0 g/dL   AST 16 15 - 41 U/L   ALT 15 0 - 44 U/L   Alkaline Phosphatase 73 38 - 126 U/L   Total Bilirubin 1.1 0.0 - 1.2 mg/dL   GFR, Estimated 27 (L) >60 mL/min    Comment:  (NOTE) Calculated using the CKD-EPI Creatinine Equation (2021)    Anion gap 11 5 - 15    Comment: Performed at [facility], [address] [place], [place] [address]  CBC with Differential     Status: Abnormal   Collection Time: 06/30/24  4:00 PM  Result Value Ref Range   WBC 9.9 4.0 - 10.5 K/uL   RBC 4.45 4.22 - 5.81 MIL/uL   Hemoglobin 13.6 13.0 - 17.0 g/dL   HCT 58.0 60.9 - 47.9 %   MCV 94.2 80.0 - 100.0 fL   MCH 30.6 26.0 - 34.0 pg   MCHC 32.5 30.0 - 36.0 g/dL   RDW 85.2 88.4 - 84.4 %   Platelets 109 (L) 150 - 400 K/uL   nRBC 0.0 0.0 - 0.2 %   Neutrophils Relative % 78 %   Neutro Abs 7.6 1.7 - 7.7 K/uL   Lymphocytes Relative 12 %   Lymphs Abs 1.2 0.7 - 4.0 K/uL   Monocytes Relative 9 %   Monocytes Absolute 0.9 0.1 - 1.0 K/uL   Eosinophils Relative 1 %   Eosinophils Absolute 0.1 0.0 - 0.5 K/uL   Basophils Relative 0 %   Basophils Absolute 0.0 0.0 - 0.1 K/uL   Immature Granulocytes 0 %   Abs Immature Granulocytes 0.04 0.00 - 0.07 K/uL   Abs Granulocyte 7.6 (H) 1.5 - 6.5 K/uL    Comment: Performed at [facility], [address] [place], [place] [address]  Urinalysis, w/ Reflex to Culture (Infection Suspected) -Urine, Clean Catch     Status: Abnormal   Collection Time: 06/30/24  4:00 PM  Result Value Ref Range   Specimen Source URINE, CLEAN CATCH  Color, Urine STRAW (A) YELLOW   APPearance CLEAR (A) CLEAR   Specific Gravity, Urine 1.010 1.005 - 1.030   pH 6.0 5.0 - 8.0   Glucose, UA >=500 (A) NEGATIVE mg/dL   Hgb urine dipstick NEGATIVE NEGATIVE   Bilirubin Urine NEGATIVE NEGATIVE   Ketones, ur NEGATIVE NEGATIVE mg/dL   Protein, ur 899 (A) NEGATIVE mg/dL   Nitrite NEGATIVE NEGATIVE   Leukocytes,Ua NEGATIVE NEGATIVE   RBC / HPF 0-5 0 - 5 RBC/hpf   WBC, UA 0-5 0 - 5 WBC/hpf    Comment:        Reflex urine culture not performed if WBC <=10, OR if Squamous epithelial cells >5. If Squamous epithelial cells >5 suggest recollection.     Bacteria, UA RARE (A) NONE SEEN   Squamous Epithelial / HPF 0 0 - 5 /HPF   Mucus PRESENT    Sperm, UA PRESENT     Comment: Performed at [facility], [address] [place], [place] [address]  APTT     Status: None   Collection Time: 06/30/24  4:25 PM  Result Value Ref Range   aPTT 32 24 - 36 seconds    Comment: Performed at [facility], [address] [place], [place] [address]  Protime-INR     Status: Abnormal   Collection Time: 06/30/24  4:25 PM  Result Value Ref Range   Prothrombin Time 17.4 (H) 11.4 - 15.2 seconds   INR 1.4 (H) 0.8 - 1.2    Comment: (NOTE) INR goal varies based on device and disease states. Performed at [facility], [address] [place], [place] [address]   Heparin  level (unfractionated)     Status: Abnormal   Collection Time: 06/30/24  4:25 PM  Result Value Ref Range   Heparin  Unfractionated >1.10 (H) 0.30 - 0.70 IU/mL    Comment: (NOTE) The clinical reportable range upper limit is being lowered to >1.10 to align with the FDA approved guidance for the current laboratory assay.  If heparin  results are below expected values, and patient dosage has  been confirmed, suggest follow up testing of antithrombin III levels. Performed at [facility], [address] [place], [place] [address]   Hemoglobin A1c     Status: None   Collection Time: 06/30/24 10:47 PM  Result Value Ref Range   Hgb A1c MFr Bld 4.9 4.8 - 5.6 %    Comment: (NOTE)         Prediabetes: 5.7 - 6.4         Diabetes: >6.4         Glycemic control for adults with diabetes: <7.0    Mean Plasma Glucose 94 mg/dL    Comment: (NOTE) Performed At: [facility] [address] [place], [place] [id number] Jennette Shorter MD Ey:[id number]   APTT     Status: Abnormal   Collection Time: 07/01/24  2:32 AM  Result Value Ref Range   aPTT 130 (H) 24 - 36 seconds    Comment:        IF BASELINE aPTT IS ELEVATED, SUGGEST PATIENT RISK  ASSESSMENT BE USED TO DETERMINE APPROPRIATE ANTICOAGULANT THERAPY. Performed at [facility], [address] [place], [place] [address]   Heparin  level (unfractionated)     Status: Abnormal   Collection Time: 07/01/24  2:32 AM  Result Value Ref Range   Heparin  Unfractionated >1.10 (H) 0.30 - 0.70 IU/mL    Comment: (NOTE) The clinical reportable range upper limit is being  lowered to >1.10 to align with the FDA approved guidance for the current laboratory assay.  If heparin  results are below expected values, and patient dosage has  been confirmed, suggest follow up testing of antithrombin III levels. Performed at [facility], [address] [place], [place] [address]   CBC     Status: Abnormal   Collection Time: 07/01/24  2:32 AM  Result Value Ref Range   WBC 10.6 (H) 4.0 - 10.5 K/uL   RBC 3.96 (L) 4.22 - 5.81 MIL/uL   Hemoglobin 12.0 (L) 13.0 - 17.0 g/dL   HCT 63.4 (L) 60.9 - 47.9 %   MCV 92.2 80.0 - 100.0 fL   MCH 30.3 26.0 - 34.0 pg   MCHC 32.9 30.0 - 36.0 g/dL   RDW 85.3 88.4 - 84.4 %   Platelets 91 (L) 150 - 400 K/uL    Comment: REPEATED TO VERIFY Immature Platelet Fraction may be clinically indicated, consider ordering this additional test [id number]    nRBC 0.0 0.0 - 0.2 %    Comment: Performed at [facility], [address] [place], [place] [address]  Basic metabolic panel     Status: Abnormal   Collection Time: 07/01/24  2:32 AM  Result Value Ref Range   Sodium 139 135 - 145 mmol/L   Potassium 4.7 3.5 - 5.1 mmol/L   Chloride 111 98 - 111 mmol/L   CO2 20 (L) 22 - 32 mmol/L   Glucose, Bld 115 (H) 70 - 99 mg/dL    Comment: Glucose reference range applies only to samples taken after fasting for at least 8 hours.   BUN 36 (H) 8 - 23 mg/dL   Creatinine, Ser 8.12 (H) 0.61 - 1.24 mg/dL   Calcium  8.7 (L) 8.9 - 10.3 mg/dL   GFR, Estimated 35 (L) >60 mL/min    Comment: (NOTE) Calculated using the CKD-EPI Creatinine Equation (2021)     Anion gap 8 5 - 15    Comment: Performed at [facility], [address], [place], [place] [address]  Uric acid     Status: None   Collection Time: 07/01/24  2:32 AM  Result Value Ref Range   Uric Acid, Serum 7.1 3.7 - 8.6 mg/dL    Comment: Performed at [facility], [address] [place], [place] [address]  Glucose, capillary     Status: None   Collection Time: 07/01/24  8:22 AM  Result Value Ref Range   Glucose-Capillary 91 70 - 99 mg/dL    Comment: Glucose reference range applies only to samples taken after fasting for at least 8 hours.  Glucose, capillary     Status: None   Collection Time: 07/01/24 12:11 PM  Result Value Ref Range   Glucose-Capillary 94 70 - 99 mg/dL    Comment: Glucose reference range applies only to samples taken after fasting for at least 8 hours.  APTT     Status: Abnormal   Collection Time: 07/01/24  1:08 PM  Result Value Ref Range   aPTT 126 (H) 24 - 36 seconds    Comment:        IF BASELINE aPTT IS ELEVATED, SUGGEST PATIENT RISK ASSESSMENT BE USED TO DETERMINE APPROPRIATE ANTICOAGULANT THERAPY. Performed at [facility], [address] [place], [place] [address]   Glucose, capillary     Status: None   Collection Time: 07/01/24  4:56 PM  Result Value Ref Range   Glucose-Capillary 98 70 - 99 mg/dL  Comment: Glucose reference range applies only to samples taken after fasting for at least 8 hours.   Comment 1 Notify RN    Comment 2 Document in Chart   Glucose, capillary     Status: None   Collection Time: 07/01/24  9:12 PM  Result Value Ref Range   Glucose-Capillary 95 70 - 99 mg/dL    Comment: Glucose reference range applies only to samples taken after fasting for at least 8 hours.  APTT     Status: Abnormal   Collection Time: 07/01/24 11:06 PM  Result Value Ref Range   aPTT 108 (H) 24 - 36 seconds    Comment:        IF BASELINE aPTT IS ELEVATED, SUGGEST PATIENT RISK ASSESSMENT BE USED TO DETERMINE  APPROPRIATE ANTICOAGULANT THERAPY. Performed at [facility], [address] [place], [place] [address]   Hepatitis C antibody     Status: None   Collection Time: 07/02/24  4:51 AM  Result Value Ref Range   HCV Ab NON REACTIVE NON REACTIVE    Comment: (NOTE) Nonreactive HCV antibody screen is consistent with no HCV infections,  unless recent infection is suspected or other evidence exists to indicate HCV infection.  Performed at [facility], [address] [place], [place] [address]   Basic metabolic panel     Status: Abnormal   Collection Time: 07/02/24  4:51 AM  Result Value Ref Range   Sodium 136 135 - 145 mmol/L   Potassium 4.5 3.5 - 5.1 mmol/L   Chloride 110 98 - 111 mmol/L   CO2 20 (L) 22 - 32 mmol/L   Glucose, Bld 89 70 - 99 mg/dL    Comment: Glucose reference range applies only to samples taken after fasting for at least 8 hours.   BUN 38 (H) 8 - 23 mg/dL   Creatinine, Ser 8.25 (H) 0.61 - 1.24 mg/dL   Calcium  8.5 (L) 8.9 - 10.3 mg/dL   GFR, Estimated 38 (L) >60 mL/min    Comment: (NOTE) Calculated using the CKD-EPI Creatinine Equation (2021)    Anion gap 6 5 - 15    Comment: Performed at [facility], [address] [place], [place] [address]  CBC     Status: Abnormal   Collection Time: 07/02/24  4:51 AM  Result Value Ref Range   WBC 5.6 4.0 - 10.5 K/uL   RBC 3.65 (L) 4.22 - 5.81 MIL/uL   Hemoglobin 11.2 (L) 13.0 - 17.0 g/dL   HCT 66.2 (L) 60.9 - 47.9 %   MCV 92.3 80.0 - 100.0 fL   MCH 30.7 26.0 - 34.0 pg   MCHC 33.2 30.0 - 36.0 g/dL   RDW 85.1 88.4 - 84.4 %   Platelets 79 (L) 150 - 400 K/uL    Comment: Immature Platelet Fraction may be clinically indicated, consider ordering this additional test [id number]    nRBC 0.0 0.0 - 0.2 %    Comment: Performed at [facility], [address] [place], [place] [address]  Glucose, capillary     Status: None   Collection Time: 07/02/24  7:29 AM  Result Value Ref Range    Glucose-Capillary 99 70 - 99 mg/dL    Comment: Glucose reference range applies only to samples taken after fasting for at least 8 hours.   Comment 1 Notify RN    Comment 2 Document in Chart   Heparin  level (unfractionated)     Status: Abnormal   Collection Time:  07/02/24  8:26 AM  Result Value Ref Range   Heparin  Unfractionated 1.02 (H) 0.30 - 0.70 IU/mL    Comment: (NOTE) The clinical reportable range upper limit is being lowered to >1.10 to align with the FDA approved guidance for the current laboratory assay.  If heparin  results are below expected values, and patient dosage has  been confirmed, suggest follow up testing of antithrombin III levels. Performed at [facility], [address] [place], [place] [address]   APTT     Status: Abnormal   Collection Time: 07/02/24  8:26 AM  Result Value Ref Range   aPTT 67 (H) 24 - 36 seconds    Comment:        IF BASELINE aPTT IS ELEVATED, SUGGEST PATIENT RISK ASSESSMENT BE USED TO DETERMINE APPROPRIATE ANTICOAGULANT THERAPY. Performed at [facility], [address] [place], [place] [address]   Glucose, capillary     Status: Abnormal   Collection Time: 07/02/24 11:23 AM  Result Value Ref Range   Glucose-Capillary 124 (H) 70 - 99 mg/dL    Comment: Glucose reference range applies only to samples taken after fasting for at least 8 hours.   Comment 1 Notify RN    Comment 2 Document in Chart   Glucose, capillary     Status: Abnormal   Collection Time: 07/02/24  4:07 PM  Result Value Ref Range   Glucose-Capillary 121 (H) 70 - 99 mg/dL    Comment: Glucose reference range applies only to samples taken after fasting for at least 8 hours.   Comment 1 Notify RN    Comment 2 Document in Chart   APTT     Status: Abnormal   Collection Time: 07/02/24  4:17 PM  Result Value Ref Range   aPTT 59 (H) 24 - 36 seconds    Comment:        IF BASELINE aPTT IS ELEVATED, SUGGEST PATIENT RISK ASSESSMENT BE USED TO DETERMINE  APPROPRIATE ANTICOAGULANT THERAPY. Performed at [facility], [address] [place], [place] [address]   Glucose, capillary     Status: Abnormal   Collection Time: 07/02/24  8:50 PM  Result Value Ref Range   Glucose-Capillary 112 (H) 70 - 99 mg/dL    Comment: Glucose reference range applies only to samples taken after fasting for at least 8 hours.  APTT     Status: Abnormal   Collection Time: 07/03/24 12:43 AM  Result Value Ref Range   aPTT 80 (H) 24 - 36 seconds    Comment:        IF BASELINE aPTT IS ELEVATED, SUGGEST PATIENT RISK ASSESSMENT BE USED TO DETERMINE APPROPRIATE ANTICOAGULANT THERAPY. Performed at [facility], [address] [place], [place] [address]   CBC     Status: Abnormal   Collection Time: 07/03/24  4:41 AM  Result Value Ref Range   WBC 5.0 4.0 - 10.5 K/uL   RBC 3.71 (L) 4.22 - 5.81 MIL/uL   Hemoglobin 11.3 (L) 13.0 - 17.0 g/dL   HCT 65.1 (L) 60.9 - 47.9 %   MCV 93.8 80.0 - 100.0 fL   MCH 30.5 26.0 - 34.0 pg   MCHC 32.5 30.0 - 36.0 g/dL   RDW 85.1 88.4 - 84.4 %   Platelets 84 (L) 150 - 400 K/uL    Comment: REPEATED TO VERIFY Immature Platelet Fraction may be clinically indicated, consider ordering this additional test [id number]    nRBC 0.0 0.0 - 0.2 %  Comment: Performed at [facility], [address] [place], [place] [address]  Basic metabolic panel     Status: Abnormal   Collection Time: 07/03/24  4:41 AM  Result Value Ref Range   Sodium 140 135 - 145 mmol/L   Potassium 4.5 3.5 - 5.1 mmol/L   Chloride 111 98 - 111 mmol/L   CO2 21 (L) 22 - 32 mmol/L   Glucose, Bld 100 (H) 70 - 99 mg/dL    Comment: Glucose reference range applies only to samples taken after fasting for at least 8 hours.   BUN 44 (H) 8 - 23 mg/dL   Creatinine, Ser 8.19 (H) 0.61 - 1.24 mg/dL   Calcium  9.1 8.9 - 10.3 mg/dL   GFR, Estimated 36 (L) >60 mL/min    Comment: (NOTE) Calculated using the CKD-EPI Creatinine Equation (2021)    Anion  gap 8 5 - 15    Comment: Performed at [facility], [address] [place], [place] [address]  APTT     Status: Abnormal   Collection Time: 07/03/24  9:31 AM  Result Value Ref Range   aPTT 56 (H) 24 - 36 seconds    Comment:        IF BASELINE aPTT IS ELEVATED, SUGGEST PATIENT RISK ASSESSMENT BE USED TO DETERMINE APPROPRIATE ANTICOAGULANT THERAPY. Performed at [facility], [address] [place], [place] [address]   Heparin  level (unfractionated)     Status: None   Collection Time: 07/03/24  9:31 AM  Result Value Ref Range   Heparin  Unfractionated 0.65 0.30 - 0.70 IU/mL    Comment: (NOTE) The clinical reportable range upper limit is being lowered to >1.10 to align with the FDA approved guidance for the current laboratory assay.  If heparin  results are below expected values, and patient dosage has  been confirmed, suggest follow up testing of antithrombin III levels. Performed at [facility], [address] [place], [place] [address]   Glucose, capillary     Status: None   Collection Time: 07/03/24 11:54 AM  Result Value Ref Range   Glucose-Capillary 87 70 - 99 mg/dL    Comment: Glucose reference range applies only to samples taken after fasting for at least 8 hours.   Comment 1 Notify RN    Comment 2 Document in Chart   APTT     Status: Abnormal   Collection Time: 07/03/24  9:11 PM  Result Value Ref Range   aPTT 50 (H) 24 - 36 seconds    Comment:        IF BASELINE aPTT IS ELEVATED, SUGGEST PATIENT RISK ASSESSMENT BE USED TO DETERMINE APPROPRIATE ANTICOAGULANT THERAPY. Performed at [facility], [address] [place], [place] [address]   Glucose, capillary     Status: Abnormal   Collection Time: 07/03/24 10:23 PM  Result Value Ref Range   Glucose-Capillary 100 (H) 70 - 99 mg/dL    Comment: Glucose reference range applies only to samples taken after fasting for at least 8 hours.  Heparin  level (unfractionated)     Status:  None   Collection Time: 07/04/24  4:19 AM  Result Value Ref Range   Heparin  Unfractionated 0.57 0.30 - 0.70 IU/mL    Comment: (NOTE) The clinical reportable range upper limit is being lowered to >1.10 to align with the FDA approved guidance for the current laboratory assay.  If heparin  results are below expected values, and patient dosage has  been confirmed, suggest follow up testing of  antithrombin III levels. Performed at [facility], [address] [place], [place] [address]   CBC     Status: Abnormal   Collection Time: 07/04/24  4:19 AM  Result Value Ref Range   WBC 4.1 4.0 - 10.5 K/uL   RBC 3.67 (L) 4.22 - 5.81 MIL/uL   Hemoglobin 11.2 (L) 13.0 - 17.0 g/dL   HCT 65.2 (L) 60.9 - 47.9 %   MCV 94.6 80.0 - 100.0 fL   MCH 30.5 26.0 - 34.0 pg   MCHC 32.3 30.0 - 36.0 g/dL   RDW 85.3 88.4 - 84.4 %   Platelets 85 (L) 150 - 400 K/uL    Comment: CONSISTENT WITH PREVIOUS RESULT Immature Platelet Fraction may be clinically indicated, consider ordering this additional test [id number]    nRBC 0.0 0.0 - 0.2 %    Comment: Performed at [facility], [address], [place], [place] [address]  Basic metabolic panel with GFR     Status: Abnormal   Collection Time: 07/04/24  4:19 AM  Result Value Ref Range   Sodium 141 135 - 145 mmol/L   Potassium 4.8 3.5 - 5.1 mmol/L   Chloride 113 (H) 98 - 111 mmol/L   CO2 21 (L) 22 - 32 mmol/L   Glucose, Bld 91 70 - 99 mg/dL    Comment: Glucose reference range applies only to samples taken after fasting for at least 8 hours.   BUN 39 (H) 8 - 23 mg/dL   Creatinine, Ser 8.15 (H) 0.61 - 1.24 mg/dL   Calcium  9.0 8.9 - 10.3 mg/dL   GFR, Estimated 35 (L) >60 mL/min    Comment: (NOTE) Calculated using the CKD-EPI Creatinine Equation (2021)    Anion gap 7 5 - 15    Comment: Performed at [facility], [address] [place], [place] [address]  APTT     Status: Abnormal   Collection Time: 07/04/24  4:19 AM  Result Value  Ref Range   aPTT 83 (H) 24 - 36 seconds    Comment:        IF BASELINE aPTT IS ELEVATED, SUGGEST PATIENT RISK ASSESSMENT BE USED TO DETERMINE APPROPRIATE ANTICOAGULANT THERAPY. Performed at [facility], [address] [place], [place] [address]   Glucose, capillary     Status: None   Collection Time: 07/04/24  8:21 AM  Result Value Ref Range   Glucose-Capillary 86 70 - 99 mg/dL    Comment: Glucose reference range applies only to samples taken after fasting for at least 8 hours.   Comment 1 Notify RN    Comment 2 Document in Chart   Glucose, capillary     Status: None   Collection Time: 07/04/24 12:20 PM  Result Value Ref Range   Glucose-Capillary 90 70 - 99 mg/dL    Comment: Glucose reference range applies only to samples taken after fasting for at least 8 hours.  APTT     Status: None   Collection Time: 07/04/24  1:54 PM  Result Value Ref Range   aPTT 33 24 - 36 seconds    Comment: Performed at [facility], [address] [place], [place] [address]  Glucose, capillary     Status: None   Collection Time: 07/04/24  4:13 PM  Result Value Ref Range   Glucose-Capillary 90 70 - 99 mg/dL    Comment: Glucose reference range applies only to samples taken after fasting for at least 8 hours.   Comment 1 Notify  RN    Comment 2 Document in Chart   Glucose, capillary     Status: Abnormal   Collection Time: 07/04/24  9:29 PM  Result Value Ref Range   Glucose-Capillary 102 (H) 70 - 99 mg/dL    Comment: Glucose reference range applies only to samples taken after fasting for at least 8 hours.  Basic metabolic panel with GFR     Status: Abnormal   Collection Time: 07/05/24  3:49 AM  Result Value Ref Range   Sodium 138 135 - 145 mmol/L   Potassium 4.9 3.5 - 5.1 mmol/L   Chloride 107 98 - 111 mmol/L   CO2 21 (L) 22 - 32 mmol/L   Glucose, Bld 108 (H) 70 - 99 mg/dL    Comment: Glucose reference range applies only to samples taken after fasting for at least 8 hours.    BUN 39 (H) 8 - 23 mg/dL   Creatinine, Ser 7.59 (H) 0.61 - 1.24 mg/dL   Calcium  9.7 8.9 - 10.3 mg/dL   GFR, Estimated 26 (L) >60 mL/min    Comment: (NOTE) Calculated using the CKD-EPI Creatinine Equation (2021)    Anion gap 10 5 - 15    Comment: Performed at [facility], [address] [place], [place] [address]  CBC     Status: Abnormal   Collection Time: 07/05/24  3:49 AM  Result Value Ref Range   WBC 6.1 4.0 - 10.5 K/uL   RBC 4.20 (L) 4.22 - 5.81 MIL/uL   Hemoglobin 12.7 (L) 13.0 - 17.0 g/dL   HCT 60.0 60.9 - 47.9 %   MCV 95.0 80.0 - 100.0 fL   MCH 30.2 26.0 - 34.0 pg   MCHC 31.8 30.0 - 36.0 g/dL   RDW 85.0 88.4 - 84.4 %   Platelets 112 (L) 150 - 400 K/uL   nRBC 0.0 0.0 - 0.2 %    Comment: Performed at [facility], [address] [place], [place] [address]  Glucose, capillary     Status: Abnormal   Collection Time: 07/05/24  7:55 AM  Result Value Ref Range   Glucose-Capillary 103 (H) 70 - 99 mg/dL    Comment: Glucose reference range applies only to samples taken after fasting for at least 8 hours.  Glucose, capillary     Status: Abnormal   Collection Time: 07/05/24 11:14 AM  Result Value Ref Range   Glucose-Capillary 156 (H) 70 - 99 mg/dL    Comment: Glucose reference range applies only to samples taken after fasting for at least 8 hours.    Radiology PERIPHERAL VASCULAR CATHETERIZATION Result Date: 07/04/2024 See surgical note for result.  US  ARTERIAL ABI (SCREENING LOWER EXTREMITY) Result Date: 07/01/2024 CLINICAL DATA:  Peripheral vascular disease and claudication. Left lower extremity arterial duplex ultrasound demonstrating left SFA occlusive disease. EXAM: NONINVASIVE PHYSIOLOGIC VASCULAR STUDY OF BILATERAL LOWER EXTREMITIES TECHNIQUE: Evaluation of both lower extremities were performed at rest, including calculation of ankle-brachial indices with single level Doppler, pressure and pulse volume recording. COMPARISON:  Left lower extremity  arterial duplex ultrasound on 06/30/2024 FINDINGS: Right ABI:  0.59 Left ABI:  0.60 Right Lower Extremity: Monophasic posterior tibial and dorsalis pedis waveforms. Left Lower Extremity: Monophasic posterior tibial and dorsalis pedis waveforms. 0.5-0.79 Moderate PAD IMPRESSION: Evidence of moderate bilateral lower extremity peripheral vascular disease with resting ankle-brachial indices of 0.59 on the right and 0.60 on the left. Monophasic distal waveforms bilaterally. Electronically Signed   By: Marcey Moan M.D.   On: 07/01/2024  13:46   DG Ankle 2 Views Left Result Date: 06/30/2024 CLINICAL DATA:  Ankle pain swelling EXAM: LEFT ANKLE - 2 VIEW COMPARISON:  None Available. FINDINGS: Generalized soft tissue swelling. No acute fracture or malalignment. Medial and lateral degenerative changes. Ossicle or remote injury at the tip of medial malleolus. IMPRESSION: Soft tissue swelling with degenerative changes. No acute osseous abnormality. Electronically Signed   By: Luke Bun M.D.   On: 06/30/2024 19:19   US  ARTERIAL LOWER EXTREMITY DUPLEX LEFT (NON-ABI) Result Date: 06/30/2024 CLINICAL DATA:  Ischemic toe EXAM: LEFT LOWER EXTREMITY ARTERIAL DUPLEX SCAN TECHNIQUE: Gray-scale sonography as well as color Doppler and duplex ultrasound was performed to evaluate the lower extremity arteries including the common, superficial and profunda femoral arteries, popliteal artery and calf arteries. COMPARISON:  None Available. FINDINGS: Left lower Extremity Inflow: Normal common femoral arterial waveforms and velocities. No evidence of inflow (aortoiliac) disease. Outflow: Focal occlusion of the superficial femoral artery in the mid thigh. The vessel reconstitutes as the popliteal artery. Abnormal post occlusive delayed waveforms in the popliteal artery. Atherosclerotic plaque results in significantly elevated peak systolic velocities in the proximal profunda femoral artery consistent with greater than 60% stenosis.  Runoff: Abnormal monophasic arterial waveforms in the anterior tibial artery. The posterior tibial artery appears occluded. IMPRESSION: 1. Complete occlusion of the left superficial femoral artery in the mid and distal thigh. 2. Greater than 60% diameter stenosis of the origins of the profunda femoral artery. 3. Complete occlusion of the posterior tibial artery. Electronically Signed   By: Wilkie Lent M.D.   On: 06/30/2024 17:52   CT Head Wo Contrast Result Date: 06/30/2024 CLINICAL DATA:  Will likely need to start heparin  drip, prior history of ICH EXAM: CT HEAD WITHOUT CONTRAST TECHNIQUE: Contiguous axial images were obtained from the base of the skull through the vertex without intravenous contrast. RADIATION DOSE REDUCTION: This exam was performed according to the departmental dose-optimization program which includes automated exposure control, adjustment of the mA and/or kV according to patient size and/or use of iterative reconstruction technique. COMPARISON:  CT head June 5, 25. FINDINGS: Brain: No evidence of acute infarction, hemorrhage, hydrocephalus, extra-axial collection or mass lesion/mass effect. Remote right posterior MCA territory infarcts, unchanged. Vascular: Calcific atherosclerosis.  No hyperdense vessel. Skull: No acute fracture. Sinuses/Orbits: Clear sinuses.  No acute orbital findings. Other: No mastoid effusions. IMPRESSION: 1. No evidence of acute intracranial abnormality. 2. Unchanged remote right posterior MCA territory infarcts. Electronically Signed   By: Gilmore GORMAN Molt M.D.   On: 06/30/2024 17:20   DG Chest 2 View Result Date: 06/30/2024 CLINICAL DATA:  Infection. Bilateral feet and ankle swelling. Difficulty walking. EXAM: CHEST - 2 VIEW COMPARISON:  None Available. FINDINGS: The heart is enlarged and the mediastinal contour is within normal limits. There is atherosclerotic calcification of the aorta. The pulmonary vasculature is distended. There is a small right  pleural effusion. No pneumothorax is seen. Airspace disease is noted in the lingular segment of the left upper lobe. A loop recorder device is present over the left chest. Sternotomy wires are noted over the midline. No acute osseous abnormality. IMPRESSION: 1. Cardiomegaly with distended pulmonary vasculature. 2. Airspace disease in the left upper lobe, possible atelectasis or infiltrate. 3. Small right pleural effusion. Electronically Signed   By: Leita Birmingham M.D.   On: 06/30/2024 17:01    Assessment/Plan  AAA (abdominal aortic aneurysm) without rupture (HCC) Several year status post repair.  Doing well.  To be checked early next year.  Stent graft was patent on recent angiogram.  Stage 3b chronic kidney disease Can worsen LE swelling.  Peripheral arterial disease (HCC) Left common femoral and profunda femoris disease is present in addition to a long SFA occlusion.  He really does not have much in the way of symptoms and his disease has been longstanding with significant reduction in his ABIs bilaterally for over a decade.  As long as he does not have symptoms and does not have any limb threatening issues such as ulceration, infection, or rest pain, I would not recommend femoral endarterectomy and extensive surgery for his disease.  That would be what is required, and given his age and comorbidities this would be very involved.  He has follow-up ABI scheduled for several months from now.  Swelling of limb Swelling has gone down but is still noticeable.  Would recommend continued compression and elevation is much as possible.  His activity is limited by his other comorbidities but he is still walking.    Hypertension blood pressure control important in reducing the progression of atherosclerotic disease and AAA growth. On appropriate oral medications.     Dyslipidemia lipid control important in reducing the progression of atherosclerotic disease. Continue statin therapy   Diabetes  mellitus without complication (HCC) blood glucose control important in reducing the progression of atherosclerotic disease. Also, involved in wound healing. On appropriate medications.  Selinda Gu, MD  07/16/2024 11:47 AM    This note was created with Dragon medical transcription system.  Any errors from dictation are purely unintentional

## 2024-07-16 NOTE — Assessment & Plan Note (Signed)
 Left common femoral and profunda femoris disease is present in addition to a long SFA occlusion.  He really does not have much in the way of symptoms and his disease has been longstanding with significant reduction in his ABIs bilaterally for over a decade.  As long as he does not have symptoms and does not have any limb threatening issues such as ulceration, infection, or rest pain, I would not recommend femoral endarterectomy and extensive surgery for his disease.  That would be what is required, and given his age and comorbidities this would be very involved.  He has follow-up ABI scheduled for several months from now.

## 2024-07-16 NOTE — Assessment & Plan Note (Signed)
 Several year status post repair.  Doing well.  To be checked early next year.  Stent graft was patent on recent angiogram.

## 2024-09-17 NOTE — Progress Notes (Signed)
 Established Patient Visit   Chief Complaint: Chief Complaint  Patient presents with   Follow-up    6 month    Date of Service: 09/17/2024 Date of Birth: February 25, 1936 PCP: Nathaniel Norleen Lenis, MD  History of Present Illness: Nathaniel Gamble is a 88 y.o.male patient with a history of  CABG x 5 (LIMA to LAD, SVG to RCA and AM, SVG to D1, SVG to OM1), 06/2009 NSTEMI, 05/2009 Peripheral artery disease, carotid artery stenosis Hypertension Hyperlipidemia Renal artery stenosis, stent left renal artery, 07/2010 AAA CKD Previous tobacco abuse x 30 years, quit 05/2009   CVA   Linq implantation   Paroxysmal atrial fibrillation, noted on Linq (EOL)   Occlusion/PTA left superficial femoral artery and posterior tibial artery 07/04/2024 [facility])  The patient presents today for a follow-up, previously followed by Dr. Bosie and Dr. Lawyer.  The patient denies exertional chest pain.  He has chronic exertional dyspnea, which is unchanged.  He denies palpitations or heart racing.  He has mild intermittent peripheral edema left greater than right.  Patient is active in his garden, but no longer exercises.   2D echocardiogram 06/2019 revealed normal left ventricular function with LVEF greater than 55% with moderate LVH and mild aortic insufficiency, and negative bubble study.  The patient underwent ETT Myoview in 2018, exercising for 8:16 minutes on a Bruce protocol with negative ECG, LVEF 52% with no evidence of reversible ischemia.  The patient has paroxysmal atrial fibrillation with a chads vasc score of 6, on low dose Eliquis  for stroke prevention. He is on carvedilol  for rate control.  The patient has hyperlipidemia, LDL 68 on 08/14/2024, on simvastatin , which is well-tolerated without apparent side effects, followed by his primary care provider.  The patient follows a low-cholesterol, low-fat diet.  The patient has hypertension, which is well controlled today, currently on hydralazine , losartan , amlodipine , and  carvedilol , which are well-tolerated without apparent side effects.  The patient follows a low-sodium, no added salt diet.  The patient has peripheral artery disease with history of AAA, renal artery stenosis, and carotid stenosis, and recent occlusion left superficial femoral artery and posterior tibial artery, status post PTA by Dr. Marea.  Past Medical and Surgical History  Past Medical History Past Medical History:  Diagnosis Date   AAA (abdominal aortic aneurysm) ()    Carotid artery stenosis    COPD (chronic obstructive pulmonary disease) (CMS/HHS-HCC)    Coronary artery disease    Diverticulosis    DJD (degenerative joint disease)    Gout    Gouty arthritis    Right foot   History of stroke    Hyperlipidemia    Hypertension    Melanoma (CMS/HHS-HCC)    right arm axilla   PVD (peripheral vascular disease)    Lower extremities   Renal artery stenosis    Renal insufficiency    Sinusitis, chronic, unspecified    Type 2 diabetes mellitus (CMS/HHS-HCC)     Past Surgical History He has a past surgical history that includes melanoma removed from right axilla; Cardiac catheterization; renal artery stent; Tonsillectomy; jaw surgery; Coronary artery bypass graft; WISDOM TEETH; esophagogastrodoudenoscopy w/percutaneous gastrostomy tube (N/A, 06/21/2019); abdominal aorta procedure (2021); and Abdominal aortic aneurysm repair (12/25/2019).   Medications and Allergies  Current Medications  Current Outpatient Medications  Medication Sig Dispense Refill   amLODIPine  (NORVASC ) 10 MG tablet TAKE 1 TABLET BY MOUTH DAILY 30 tablet 5   aspirin  81 MG EC tablet TAKE 1 TABLET BY MOUTH DAILY 90 tablet 0  carvediloL  (COREG ) 25 MG tablet TAKE 1 TABLET BY MOUTH TWICE A DAY 60 tablet 5   cyanocobalamin  (VITAMIN B12) 1000 MCG tablet Take 1,000 mcg by mouth once daily     ELIQUIS  2.5 mg tablet TAKE 1 TABLET BY MOUTH EVERY 12 HOURS 60 tablet 5   ergocalciferol , vitamin D2, 1,250  mcg (50,000 unit) capsule Take 50,000 Units by mouth monthly        hydrALAZINE  (APRESOLINE ) 10 MG tablet TAKE 1 TABLET BY MOUTH NIGHTLY 30 tablet 5   KERENDIA 10 mg Tab Take 10 mg by mouth once daily     losartan  (COZAAR ) 100 MG tablet TAKE 1 TABLET BY MOUTH DAILY 90 tablet 1   simvastatin  (ZOCOR ) 40 MG tablet TAKE 1 TABLET BY MOUTH NIGHTLY 90 tablet 1   dapagliflozin  (FARXIGA ) 10 mg tablet Take by mouth     No current facility-administered medications for this visit.    Allergies: Lisinopril, Oxycodone , and Tetanus toxoid adsorbed  Social and Family History  Social History  reports that he quit smoking about 15 years ago. His smoking use included cigarettes and pipe. He started smoking about 55 years ago. He has a 40 pack-year smoking history. He has never used smokeless tobacco. He reports that he does not currently use alcohol. He reports that he does not use drugs.  Family History Family History  Problem Relation Name Age of Onset   Stroke Mother [name]    Raynaud syndrome Mother [name]     Review of Systems   Review of Systems: The patient denies chest pain, shortness of breath, orthopnea, paroxysmal nocturnal dyspnea, pedal edema, palpitations, heart racing, presyncope, syncope. Review of 12 Systems is negative except as described above.  Physical Examination   Vitals:BP 130/80   Pulse 65   Ht 177.8 cm (5' 10)   Wt 66.2 kg (146 lb)   SpO2 97%   BMI 20.95 kg/m  Ht:177.8 cm (5' 10) Wt:66.2 kg (146 lb) ADJ:Anib surface area is 1.81 meters squared. Body mass index is 20.95 kg/m.  General: Alert and oriented. Well-appearing. No acute distress. HEENT: Pupils equally reactive to light and accomodation    Neck: no JVD Lungs: Normal effort of breathing; clear to auscultation bilaterally; no wheezes, rales, rhonchi Heart: Regular rate and rhythm. No murmur, rub, or gallop Abdomen: soft nontender, nondistended,  Extremities: no cyanosis, clubbing,  with mild bilateral ankle edema Peripheral Pulses: 2+ radial  Skin: Warm, dry, no diaphoresis   Assessment   88 y.o. male with  1. Abdominal aortic aneurysm (AAA) without rupture, unspecified part ()   2. Coronary artery disease involving native coronary artery of native heart without angina pectoris   3. Primary hypertension   4. Intermittent atrial fibrillation (CMS/HHS-HCC)   5. Peripheral arterial disease   6. Dyslipidemia   7. Type 2 diabetes mellitus with stage 3b chronic kidney disease, without long-term current use of insulin  (CMS/HHS-HCC)   8. History of tobacco abuse   9. Acute CVA (cerebrovascular accident) (CMS/HHS-HCC)   10. Carotid artery stenosis, unilateral, left   11. Renal artery stenosis    12. Stage 3b chronic kidney disease (CMS-HCC)    88 year old gentleman with a history of NSTEMI and CABG x 5 in 2010, hypertension, hyperlipidemia, PAD (carotid and renal artery stenosis, AAA, occluded left superficial femoral artery and posterior tibial artery status post PTA), hyperlipidemia, history of CVA, status post Linq implantation, which detected paroxysmal atrial fibrillation, now anticoagulated with Eliquis , and rate controlled.  The patient has  essential hypertension, blood pressure well-controlled on current BP medications.   Plan   Continue current medications Counseled patient about low-sodium diet DASH diet printed instructions given to the patient Patient about low-cholesterol diet and Continue simvastatin  for hyperlipidemia management Low-fat and cholesterol diet printed instructions given to the patient Continue Eliquis  for stroke prevention Return to clinic for follow-up in 6 months  No orders of the defined types were placed in this encounter.   Return in about 6 months (around 03/17/2025).   MARSA DOOMS, MD PhD [facility]

## 2024-10-29 ENCOUNTER — Ambulatory Visit (INDEPENDENT_AMBULATORY_CARE_PROVIDER_SITE_OTHER)

## 2024-10-29 ENCOUNTER — Ambulatory Visit (INDEPENDENT_AMBULATORY_CARE_PROVIDER_SITE_OTHER): Admitting: Nurse Practitioner

## 2024-10-29 ENCOUNTER — Other Ambulatory Visit (INDEPENDENT_AMBULATORY_CARE_PROVIDER_SITE_OTHER): Payer: Self-pay | Admitting: Vascular Surgery

## 2024-10-29 ENCOUNTER — Encounter (INDEPENDENT_AMBULATORY_CARE_PROVIDER_SITE_OTHER): Payer: Self-pay | Admitting: Nurse Practitioner

## 2024-10-29 VITALS — BP 162/74 | HR 59 | Resp 18 | Ht 70.0 in | Wt 147.4 lb

## 2024-10-29 DIAGNOSIS — S80822A Blister (nonthermal), left lower leg, initial encounter: Secondary | ICD-10-CM | POA: Diagnosis not present

## 2024-10-29 DIAGNOSIS — I739 Peripheral vascular disease, unspecified: Secondary | ICD-10-CM

## 2024-10-29 DIAGNOSIS — I70209 Unspecified atherosclerosis of native arteries of extremities, unspecified extremity: Secondary | ICD-10-CM

## 2024-10-29 DIAGNOSIS — Z9889 Other specified postprocedural states: Secondary | ICD-10-CM | POA: Diagnosis not present

## 2024-10-29 MED ORDER — DOXYCYCLINE HYCLATE 100 MG PO CAPS: 100.0000 mg | ORAL_CAPSULE | Freq: Two times a day (BID) | ORAL | 0 refills | Status: AC

## 2024-10-29 NOTE — Progress Notes (Signed)
 Subjective:    Patient ID: Nathaniel Gamble, male    DOB: 12-15-1935, 88 y.o.   MRN: [id number] Chief Complaint  Patient presents with   Follow-up    Add on ABI + consult patient has blisters    HPI  Discussed the use of AI scribe software for clinical note transcription with the patient, who gave verbal consent to proceed.  History of Present Illness Nathaniel Gamble is an 88 year old male who presents with a large blister on his leg.  He noticed the sudden appearance of a large blister on his leg yesterday after wearing long johns and thin socks. The blister extends from the knee down to the ankle, and fluid began to leak from it when he removed his long johns. No pain is associated with the blister.  He has experienced leg swelling since undergoing angioplasty two months ago, with noticeable swelling in the affected leg. He is not currently taking any diuretics for fluid management.  He is currently taking Eliquis  for blood clot prevention and a once-a-month iron supplement. He is allergic to tetanus and lisinopril, and oxycodone  causes nausea. He picks up his medications from [employer].  He mentions a past procedure where a monitoring device was placed near his heart after bypass surgery, but the home receiver for this device is no longer functioning.    Results Diagnostic Left lower extremity ABI (10/29/2024): 0.80 increased from 0.68 on 11/2023 Right lower extremity ABI (10/29/2024): 0.74 increased from 0.58 on 11/2023 Left lower extremity angiogram (06/2024): Significant  LLE arterial occlusion; no intervention performed  Unna boot application with Xeroform and zinc oxide-impregnated gauze to left lower extremity    Review of Systems  Cardiovascular:  Positive for leg swelling.  Skin:  Positive for wound.  Neurological:  Positive for weakness.  All other systems reviewed and are negative.      Objective:   Physical Exam Vitals reviewed.  HENT:      Head: Normocephalic.  Cardiovascular:     Rate and Rhythm: Normal rate.  Pulmonary:     Effort: Pulmonary effort is normal.  Musculoskeletal:     Left lower leg: 2+ Edema present.  Skin:    General: Skin is warm and dry.  Neurological:     Mental Status: He is alert and oriented to person, place, and time.  Psychiatric:        Mood and Affect: Mood normal.        Behavior: Behavior normal.        Thought Content: Thought content normal.        Judgment: Judgment normal.     Physical Exam EXTREMITIES: Increased swelling in the leg. Large blister on the leg.  BP (!) 162/74 (BP Location: Left Arm, Patient Position: Sitting, Cuff Size: Normal)   Pulse (!) 59   Resp 18   Ht 5' 10 (1.778 m)   Wt 147 lb 6.4 oz (66.9 kg)   BMI 21.15 kg/m   Past Medical History:  Diagnosis Date   Abdominal aortic aneurysm    monitored by dr. bosie   Abdominal aortic aneurysm    Cancer (HCC) 1976   melanoma, under right arm, removed   Chronic kidney disease 2011   stent right kidney   Coronary artery disease    Diabetes mellitus without complication (HCC)    Hypertension    Jaw fracture (HCC)    Myocardial infarction [facility])    Peripheral vascular disease    Renal  artery stenosis    Stroke [facility])     Social History   Socioeconomic History   Marital status: Married    Spouse name: Not on file   Number of children: Not on file   Years of education: Not on file   Highest education level: Not on file  Occupational History   Not on file  Tobacco Use   Smoking status: Former   Smokeless tobacco: Former  Substance and Sexual Activity   Alcohol use: Yes    Alcohol/week: 1.0 standard drink of alcohol    Types: 1 Glasses of wine per week   Drug use: No   Sexual activity: Not on file  Other Topics Concern   Not on file  Social History Narrative   Not on file   Social Drivers of Health   Tobacco Use: Medium Risk (10/29/2024)   Patient History    Smoking Tobacco Use: Former     Smokeless Tobacco Use: Former    Passive Exposure: Not on Actuary Strain: Low Risk  (08/21/2024)   Received from [facility]   Overall Financial Resource Strain (CARDIA)    Difficulty of Paying Living Expenses: Not hard at all  Food Insecurity: No Food Insecurity (08/21/2024)   Received from [facility]   Epic    Within the past 12 months, you worried that your food would run out before you got the money to buy more.: Never true    Within the past 12 months, the food you bought just didn't last and you didn't have money to get more.: Never true  Transportation Needs: No Transportation Needs (08/21/2024)   Received from [facility] - Transportation    In the past 12 months, has lack of transportation kept you from medical appointments or from getting medications?: No    Lack of Transportation (Non-Medical): No  Physical Activity: Not on file  Stress: Not on file  Social Connections: Socially Integrated (06/30/2024)   Social Connection and Isolation Panel    Frequency of Communication with Friends and Family: More than three times a week    Frequency of Social Gatherings with Friends and Family: Once a week    Attends Religious Services: 1 to 4 times per year    Active Member of [employer] or Organizations: No    Attends Banker Meetings: 1 to 4 times per year    Marital Status: Married  Catering Manager Violence: Not At Risk (06/30/2024)   Epic    Fear of Current or Ex-Partner: No    Emotionally Abused: No    Physically Abused: No    Sexually Abused: No  Depression (PHQ2-9): Not on file  Alcohol Screen: Not on file  Housing: Low Risk  (08/21/2024)   Received from [facility]   Epic    In the last 12 months, was there a time when you were not able to pay the mortgage or rent on time?: No    In the past 12 months, how many times have you moved where you were living?: 0    At any  time in the past 12 months, were you homeless or living in a shelter (including now)?: No  Utilities: Not At Risk (08/21/2024)   Received from [facility]   Epic    In the past 12 months has the electric, gas, oil, or water company threatened to shut off services in your home?:  No  Health Literacy: Not on file    Past Surgical History:  Procedure Laterality Date   ABDOMINAL AORTIC ENDOVASCULAR STENT GRAFT N/A 12/25/2019   Procedure: ABDOMINAL AORTIC ENDOVASCULAR STENT GRAFT;  Surgeon: Marea Selinda RAMAN, MD;  Location: [facility] ORS;  Service: Vascular;  Laterality: N/A;   CATARACT EXTRACTION Bilateral 2014   CORONARY ARTERY BYPASS GRAFT  2010   ENDOVASCULAR STENT GRAFT (AAA) N/A 12/25/2019   Procedure: ENDOVASCULAR REPAIR/STENT GRAFT;  Surgeon: Marea Selinda RAMAN, MD;  Location: [facility] INVASIVE CV LAB;  Service: Cardiovascular;  Laterality: N/A;   INGUINAL HERNIA REPAIR Left 07/09/2015   Procedure: HERNIA REPAIR INGUINAL ADULT;  Surgeon: Larinda Unknown Sharps, MD;  Location: [facility] ORS;  Service: General;  Laterality: Left;   LOOP RECORDER INSERTION N/A 06/28/2018   Procedure: LOOP RECORDER INSERTION;  Surgeon: Ammon Blunt, MD;  Location: [facility] INVASIVE CV LAB;  Service: Cardiovascular;  Laterality: N/A;   LOWER EXTREMITY ANGIOGRAPHY Left 07/04/2024   Procedure: Lower Extremity Angiography;  Surgeon: Marea Selinda RAMAN, MD;  Location: [facility] INVASIVE CV LAB;  Service: Cardiovascular;  Laterality: Left;   MANDIBLE FRACTURE SURGERY Left    RENAL ARTERY STENT Right     Family History  Problem Relation Age of Onset   Hyperlipidemia Mother    Stroke Mother    Hyperlipidemia Father     Allergies[1]     Latest Ref Rng & Units 07/05/2024    3:49 AM 07/04/2024    4:19 AM 07/03/2024    4:41 AM  CBC  WBC 4.0 - 10.5 K/uL 6.1  4.1  5.0   Hemoglobin 13.0 - 17.0 g/dL 87.2  88.7  88.6   Hematocrit 39.0 - 52.0 % 39.9  34.7  34.8   Platelets 150 - 400 K/uL 112  85  84       CMP     Component  Value Date/Time   NA 138 07/05/2024 0349   K 4.9 07/05/2024 0349   CL 107 07/05/2024 0349   CO2 21 (L) 07/05/2024 0349   GLUCOSE 108 (H) 07/05/2024 0349   BUN 39 (H) 07/05/2024 0349   CREATININE 2.40 (H) 07/05/2024 0349   CALCIUM  9.7 07/05/2024 0349   PROT 7.3 06/30/2024 1600   ALBUMIN 4.1 06/30/2024 1600   AST 16 06/30/2024 1600   ALT 15 06/30/2024 1600   ALKPHOS 73 06/30/2024 1600   BILITOT 1.1 06/30/2024 1600   GFRNONAA 26 (L) 07/05/2024 0349     No results found.     Assessment & Plan:   1. PAD (peripheral artery disease) (Primary) Peripheral arterial disease of lower extremities Peripheral arterial disease with previous angioplasty. ABI shows no significant worsening . No new interventions unless non-healing wounds develop.  - Continue current management and monitor for new symptoms or non-healing wounds.  2. Blister (nonthermal), left lower leg, initial encounter Left lower extremity blister with chronic venous insufficiency Acute blister likely from friction. Chronic venous insufficiency with swelling. Risk of worsening or infection without management. Early intervention with Unna boot to prevent progression. Doxycycline  for potential cellulitis. - Engineer, [employer], change weekly for 1-2 weeks. - Prescribed doxycycline  for potential cellulitis. - Scheduled weekly nurse visits for Unna boot changes. - Maintain appointment on December 23rd for evaluation and potential Unna boot removal.   Medications Ordered Prior to Encounter[2]  There are no Patient Instructions on file for this visit. Return for Weekly [employer] changes and keep 1/6 26 .   Nathaniel FORBES Daring, NP       [  1]  Allergies Allergen Reactions   Oxycodone  Nausea Only   Lisinopril Swelling   Tetanus Toxoid-Containing Vaccines Swelling  [2]  Current Outpatient Medications on File Prior to Visit  Medication Sig Dispense Refill   acetaminophen  (TYLENOL ) 325 MG tablet Take 1-2 tablets (325-650 mg  total) by mouth every 4 (four) hours as needed for mild pain (or temp >/= 101 F).     amLODipine  (NORVASC ) 5 MG tablet Take 5 mg by mouth daily.     aspirin  EC 81 MG EC tablet Take 1 tablet (81 mg total) by mouth daily at 6 (six) AM.     carvedilol  (COREG ) 25 MG tablet Take 25 mg by mouth 2 (two) times daily with a meal.     cyanocobalamin  (VITAMIN B12) 1000 MCG tablet Take 1,000 mcg by mouth daily.     dapagliflozin  propanediol (FARXIGA ) 10 MG TABS tablet Take 10 mg by mouth daily.     ELIQUIS  2.5 MG TABS tablet Take 2.5 mg by mouth 2 (two) times daily.     hydrALAZINE  (APRESOLINE ) 10 MG tablet Take 1 tablet by mouth at bedtime.     [Paused] losartan  (COZAAR ) 100 MG tablet Take 100 mg by mouth daily.      multivitamin-lutein  (OCUVITE-LUTEIN ) CAPS capsule Take 1 capsule by mouth daily.     Nutritional Supplements (ISOSOURCE PO) Take by mouth 5 (five) times daily.     simvastatin  (ZOCOR ) 40 MG tablet Take 40 mg by mouth daily at 6 PM.     Vitamin D , Ergocalciferol , (DRISDOL ) 50000 units CAPS capsule Take 50,000 Units by mouth once a week.   1   No current facility-administered medications on file prior to visit.

## 2024-10-29 NOTE — Progress Notes (Signed)
 History of Present Illness  There is no documented history at this time  Assessments & Plan   There are no diagnoses linked to this encounter.    Additional instructions  Subjective:  Patient presents with venous ulcer of the Right lower extremity.    Procedure:  3 layer unna wrap was placed Right lower extremity.   Xeroform applied under 3 layer zinc unna   Plan:   Follow up in one week.

## 2024-10-30 LAB — VAS US ABI WITH/WO TBI
Left ABI: 0.8
Right ABI: 0.74

## 2024-11-06 ENCOUNTER — Ambulatory Visit (INDEPENDENT_AMBULATORY_CARE_PROVIDER_SITE_OTHER): Admitting: Nurse Practitioner

## 2024-11-06 ENCOUNTER — Encounter (INDEPENDENT_AMBULATORY_CARE_PROVIDER_SITE_OTHER): Payer: Self-pay

## 2024-11-06 VITALS — BP 185/81 | HR 61 | Resp 18 | Ht 70.0 in | Wt 146.2 lb

## 2024-11-06 DIAGNOSIS — M7989 Other specified soft tissue disorders: Secondary | ICD-10-CM

## 2024-11-06 NOTE — Progress Notes (Signed)
 History of Present Illness  There is no documented history at this time  Assessments & Plan   There are no diagnoses linked to this encounter.    Additional instructions  Subjective:  Patient presents with venous ulcer of the Right lower extremity.    Procedure:  3 layer unna wrap was placed Right lower extremity.   Plan:   Follow up in one week.

## 2024-11-10 ENCOUNTER — Encounter (INDEPENDENT_AMBULATORY_CARE_PROVIDER_SITE_OTHER): Payer: Self-pay | Admitting: Nurse Practitioner

## 2024-11-13 ENCOUNTER — Encounter (INDEPENDENT_AMBULATORY_CARE_PROVIDER_SITE_OTHER): Payer: Self-pay

## 2024-11-13 ENCOUNTER — Ambulatory Visit (INDEPENDENT_AMBULATORY_CARE_PROVIDER_SITE_OTHER): Admitting: Nurse Practitioner

## 2024-11-13 VITALS — BP 146/72 | HR 61 | Resp 18

## 2024-11-13 DIAGNOSIS — M7989 Other specified soft tissue disorders: Secondary | ICD-10-CM

## 2024-11-13 NOTE — Progress Notes (Signed)
 History of Present Illness  There is no documented history at this time  Assessments & Plan   There are no diagnoses linked to this encounter.    Additional instructions  Subjective:  Patient presents with venous ulcer of the Left lower extremity.    Procedure:  3 layer unna wrap was placed Left lower extremity.   Plan:   Follow up in one week.

## 2024-11-17 ENCOUNTER — Encounter (INDEPENDENT_AMBULATORY_CARE_PROVIDER_SITE_OTHER): Payer: Self-pay | Admitting: Nurse Practitioner

## 2024-11-19 ENCOUNTER — Encounter (INDEPENDENT_AMBULATORY_CARE_PROVIDER_SITE_OTHER): Payer: Self-pay | Admitting: Vascular Surgery

## 2024-11-19 ENCOUNTER — Ambulatory Visit (INDEPENDENT_AMBULATORY_CARE_PROVIDER_SITE_OTHER): Payer: BC Managed Care – PPO | Admitting: Vascular Surgery

## 2024-11-19 ENCOUNTER — Encounter (INDEPENDENT_AMBULATORY_CARE_PROVIDER_SITE_OTHER): Payer: BC Managed Care – PPO

## 2024-11-19 ENCOUNTER — Ambulatory Visit (INDEPENDENT_AMBULATORY_CARE_PROVIDER_SITE_OTHER): Payer: BC Managed Care – PPO

## 2024-11-19 VITALS — BP 155/69 | HR 59 | Resp 17 | Ht 70.0 in | Wt 145.0 lb

## 2024-11-19 DIAGNOSIS — E119 Type 2 diabetes mellitus without complications: Secondary | ICD-10-CM

## 2024-11-19 DIAGNOSIS — I10 Essential (primary) hypertension: Secondary | ICD-10-CM | POA: Diagnosis not present

## 2024-11-19 DIAGNOSIS — E785 Hyperlipidemia, unspecified: Secondary | ICD-10-CM | POA: Diagnosis not present

## 2024-11-19 DIAGNOSIS — I739 Peripheral vascular disease, unspecified: Secondary | ICD-10-CM | POA: Diagnosis not present

## 2024-11-19 DIAGNOSIS — I7143 Infrarenal abdominal aortic aneurysm, without rupture: Secondary | ICD-10-CM

## 2024-11-19 DIAGNOSIS — N1832 Chronic kidney disease, stage 3b: Secondary | ICD-10-CM

## 2024-11-19 NOTE — Progress Notes (Signed)
 "   MRN : [id number]  Nathaniel Gamble is a 89 y.o. (09-18-1936) male who presents with chief complaint of  Chief Complaint  Patient presents with   Follow-up    fu 1 year + Evar  .  History of Present Illness:   Discussed the use of AI scribe software for clinical note transcription with the patient, who gave verbal consent to proceed.  History of Present Illness Nathaniel Gamble is an 89 year old male with peripheral arterial disease and prior abdominal aortic aneurysm repair who presents for follow-up of lower extremity ulcer and edema.  He has chronic lower extremity edema and ulceration, currently most prominent over the anterior leg.  The ulcerated area feels rough and raw, similar to a superficial burn, with some tissue present. He previously had minor bleeding with dressing changes but none this morning after leaving gauze in place overnight. He uses compression wraps but they slide, cause discomfort in his shoe, and he removed the wrap before the visit after bathing.  He notes no active bleeding, drainage, fever, or purulent discharge. The wound remains raw and he is managing it with gauze and wraps.  He has significant difficulty with mobility at home, especially on stairs, after a prior fall, and is requesting a lift chair to assist with transfers and ambulation. That certainly seems reasonable given his many issues, advanced age and poor mobility.    Results Radiology Abdominal aortic aneurysm imaging (11/19/2024): Stent graft is patent, Diameter less than 3 centimeters. (Independently interpreted)  Diagnostic ABIs right is 0.74, left is 0.80. digit pressures are 92 on the right and 110 on the left  Current Outpatient Medications  Medication Sig Dispense Refill   acetaminophen  (TYLENOL ) 325 MG tablet Take 1-2 tablets (325-650 mg total) by mouth every 4 (four) hours as needed for mild pain (or temp >/= 101 F).     amLODipine  (NORVASC ) 5 MG tablet Take 5 mg by mouth  daily.     aspirin  EC 81 MG EC tablet Take 1 tablet (81 mg total) by mouth daily at 6 (six) AM.     carvedilol  (COREG ) 25 MG tablet Take 25 mg by mouth 2 (two) times daily with a meal.     cyanocobalamin  (VITAMIN B12) 1000 MCG tablet Take 1,000 mcg by mouth daily.     dapagliflozin  propanediol (FARXIGA ) 10 MG TABS tablet Take 10 mg by mouth daily.     ELIQUIS  2.5 MG TABS tablet Take 2.5 mg by mouth 2 (two) times daily.     hydrALAZINE  (APRESOLINE ) 10 MG tablet Take 1 tablet by mouth at bedtime.     multivitamin-lutein  (OCUVITE-LUTEIN ) CAPS capsule Take 1 capsule by mouth daily.     Nutritional Supplements (ISOSOURCE PO) Take by mouth 5 (five) times daily.     simvastatin  (ZOCOR ) 40 MG tablet Take 40 mg by mouth daily at 6 PM.     Vitamin D , Ergocalciferol , (DRISDOL ) 50000 units CAPS capsule Take 50,000 Units by mouth once a week.   1   doxycycline  (VIBRAMYCIN ) 100 MG capsule Take 1 capsule (100 mg total) by mouth 2 (two) times daily. (Patient not taking: Reported on 11/19/2024) 20 capsule 0   [Paused] losartan  (COZAAR ) 100 MG tablet Take 100 mg by mouth daily.      No current facility-administered medications for this visit.    Past Medical History:  Diagnosis Date   Abdominal aortic aneurysm    monitored by dr. bosie   Abdominal aortic aneurysm  Cancer (HCC) 1976   melanoma, under right arm, removed   Chronic kidney disease 2011   stent right kidney   Coronary artery disease    Diabetes mellitus without complication (HCC)    Hypertension    Jaw fracture (HCC)    Myocardial infarction [facility])    Peripheral vascular disease    Renal artery stenosis    Stroke [facility])     Past Surgical History:  Procedure Laterality Date   ABDOMINAL AORTIC ENDOVASCULAR STENT GRAFT N/A 12/25/2019   Procedure: ABDOMINAL AORTIC ENDOVASCULAR STENT GRAFT;  Surgeon: Marea Selinda RAMAN, MD;  Location: [facility] ORS;  Service: Vascular;  Laterality: N/A;   CATARACT EXTRACTION Bilateral 2014   CORONARY ARTERY BYPASS  GRAFT  2010   ENDOVASCULAR STENT GRAFT (AAA) N/A 12/25/2019   Procedure: ENDOVASCULAR REPAIR/STENT GRAFT;  Surgeon: Marea Selinda RAMAN, MD;  Location: [facility] INVASIVE CV LAB;  Service: Cardiovascular;  Laterality: N/A;   INGUINAL HERNIA REPAIR Left 07/09/2015   Procedure: HERNIA REPAIR INGUINAL ADULT;  Surgeon: Larinda Unknown Sharps, MD;  Location: [facility] ORS;  Service: General;  Laterality: Left;   LOOP RECORDER INSERTION N/A 06/28/2018   Procedure: LOOP RECORDER INSERTION;  Surgeon: Ammon Blunt, MD;  Location: [facility] INVASIVE CV LAB;  Service: Cardiovascular;  Laterality: N/A;   LOWER EXTREMITY ANGIOGRAPHY Left 07/04/2024   Procedure: Lower Extremity Angiography;  Surgeon: Marea Selinda RAMAN, MD;  Location: [facility] INVASIVE CV LAB;  Service: Cardiovascular;  Laterality: Left;   MANDIBLE FRACTURE SURGERY Left    RENAL ARTERY STENT Right      Social History[1]    Family History  Problem Relation Age of Onset   Hyperlipidemia Mother    Stroke Mother    Hyperlipidemia Father      Allergies[2]    REVIEW OF SYSTEMS (Negative unless checked)   Constitutional: [] Weight loss  [] Fever  [] Chills Cardiac: [] Chest pain   [] Chest pressure   [] Palpitations   [] Shortness of breath when laying flat   [] Shortness of breath at rest   [] Shortness of breath with exertion. Vascular:  [] Pain in legs with walking   [] Pain in legs at rest   [] Pain in legs when laying flat   [] Claudication   [] Pain in feet when walking  [] Pain in feet at rest  [] Pain in feet when laying flat   [] History of DVT   [] Phlebitis   [x] Swelling in legs   [] Varicose veins   [] Non-healing ulcers Pulmonary:   [] Uses home oxygen   [] Productive cough   [] Hemoptysis   [] Wheeze  [] COPD   [] Asthma Neurologic:  [] Dizziness  [] Blackouts   [] Seizures   [x] History of stroke   [] History of TIA  [] Aphasia   [] Temporary blindness   [] Dysphagia   [] Weakness or numbness in arms   [] Weakness or numbness in legs Musculoskeletal:  [x] Arthritis   [] Joint swelling    [x] Joint pain   [] Low back pain Hematologic:  [] Easy bruising  [] Easy bleeding   [] Hypercoagulable state   [] Anemic   Gastrointestinal:  [] Blood in stool   [] Vomiting blood  [] Gastroesophageal reflux/heartburn   [] Abdominal pain Genitourinary:  [x] Chronic kidney disease   [] Difficult urination  [] Frequent urination  [] Burning with urination   [] Hematuria Skin:  [] Rashes   [x] Ulcers   [x] Wounds Psychological:  [] History of anxiety   []  History of major depression.    Physical Examination  BP (!) 155/69   Pulse (!) 59   Resp 17   Ht 5' 10 (1.778 m)   Wt 145 lb (65.8 kg)  BMI 20.81 kg/m  Gen:   NAD, elderly, somewhat debilitated Head: [place]/AT, No temporalis wasting. Ear/Nose/Throat: Hearing grossly intact, nares w/o erythema or drainage Eyes: Conjunctiva clear. Sclera non-icteric Neck: Supple.  Trachea midline Pulmonary:  Good air movement, no use of accessory muscles.  Cardiac: bradycardic Vascular:  Vessel Right Left  Radial Palpable Palpable                          PT Not Palpable Trace Palpable  DP 1+ Palpable 1+ Palpable    Musculoskeletal: M/S 5/5 throughout.  No deformity or atrophy.  Superficial ulceration is present in the left lower leg medially and anteriorly.  1+ bilateral lower extremity edema. Neurologic: Sensation grossly intact in extremities.  Symmetrical.  Speech is fluent.  Psychiatric: Judgment intact, Mood & affect appropriate for pt's clinical situation. Dermatologic: Superficial left lower leg ulcerations without erythema or drainage.  Physical Exam CARDIOVASCULAR: Aneurysm less than 3 cm. EXTREMITIES: Swelling improved, not significant. No bleeding. Raw areas present.    Labs Recent Results (from the past 2160 hours)  VAS US  ABI WITH/WO TBI     Status: None   Collection Time: 10/29/24  1:43 PM  Result Value Ref Range   Right ABI .74    Left ABI .80     Radiology VAS US  ABI WITH/WO TBI Result Date: 10/30/2024  LOWER EXTREMITY DOPPLER  STUDY Patient Name:  JERMAL DISMUKE  Date of Exam:   10/29/2024 Medical Rec #: [id number]         Accession #:    [id number] Date of Birth: [birth year]         Patient Gender: M Patient Age:   61 years Exam Location:  [facility] Procedure:      VAS US  ABI WITH/WO TBI Referring Phys: SELINDA Marita Burnsed --------------------------------------------------------------------------------  Indications: Peripheral artery disease. High Risk Factors: Hypertension, hyperlipidemia.  Vascular Interventions: EVAR of AAA. Comparison Study: 11/2023 Performing Technologist: Jerel Croak RVT  Examination Guidelines: A complete evaluation includes at minimum, Doppler waveform signals and systolic blood pressure reading at the level of bilateral brachial, anterior tibial, and posterior tibial arteries, when vessel segments are accessible. Bilateral testing is considered an integral part of a complete examination. Photoelectric Plethysmograph (PPG) waveforms and toe systolic pressure readings are included as required and additional duplex testing as needed. Limited examinations for reoccurring indications may be performed as noted.  ABI Findings: +---------+------------------+-----+----------+--------+ Right    Rt Pressure (mmHg)IndexWaveform  Comment  +---------+------------------+-----+----------+--------+ Brachial 154                                       +---------+------------------+-----+----------+--------+ PTA      115               0.74 monophasic         +---------+------------------+-----+----------+--------+ DP       105               0.68 monophasic         +---------+------------------+-----+----------+--------+ Great Toe92                0.59 Abnormal           +---------+------------------+-----+----------+--------+ +---------+------------------+-----+----------+-------+ Left     Lt Pressure (mmHg)IndexWaveform  Comment +---------+------------------+-----+----------+-------+  Brachial 155                                      +---------+------------------+-----+----------+-------+  PTA      115               0.74 monophasic        +---------+------------------+-----+----------+-------+ DP       124               0.80 monophasic        +---------+------------------+-----+----------+-------+ Great Toe110               0.71 Normal            +---------+------------------+-----+----------+-------+ +-------+-----------+-----------+------------+------------+ ABI/TBIToday's ABIToday's TBIPrevious ABIPrevious TBI +-------+-----------+-----------+------------+------------+ Right  .74        .59        .58         .39          +-------+-----------+-----------+------------+------------+ Left   .80        .71        .68         .47          +-------+-----------+-----------+------------+------------+  Bilateral ABIs and TBIs appear increased compared to prior study on 11/2023.  Summary: Right: Resting right ankle-brachial index indicates moderate right lower extremity arterial disease. The right toe-brachial index is abnormal.  Left: Resting left ankle-brachial index indicates moderate left lower extremity arterial disease. The left toe-brachial index is normal.  *See table(s) above for measurements and observations.  Electronically signed by Selinda Gu MD on 10/30/2024 at 8:59:44 AM.    Final     Assessment/Plan Assessment & Plan Peripheral arterial disease with lower extremity ulcer Chronic peripheral arterial disease with healing lower extremity ulcer. Adequate perfusion, no infection or hemorrhage. Surgical intervention not indicated unless ulcer regresses or becomes infected. - Applied Xeroform dressing for wound care. - Requested lift chair for mobility assistance. - Recheck ABIs in 6 months.  Abdominal aortic aneurysm, post-repair, stable Post endovascular repair of abdominal aortic aneurysm. Imaging shows stability with aneurysm under 3 cm  status postrepair, no recurrence.  Stent graft is patent - Scheduled annual abdominal aortic aneurysm surveillance imaging. - Scheduled lower extremity circulation assessment in six months.  Hypertension blood pressure control important in reducing the progression of atherosclerotic disease and AAA growth. On appropriate oral medications.     Dyslipidemia lipid control important in reducing the progression of atherosclerotic disease. Continue statin therapy   Diabetes mellitus without complication (HCC) blood glucose control important in reducing the progression of atherosclerotic disease. Also, involved in wound healing. On appropriate medications.  Stage 3b chronic kidney disease (HCC) Avoid contrast unless absolutely necessary.  Selinda Gu, MD  11/19/2024 3:07 PM    This note was created with Dragon medical transcription system.  Any errors from dictation are purely unintentional     [1]  Social History Tobacco Use   Smoking status: Former   Smokeless tobacco: Former  Substance Use Topics   Alcohol use: Yes    Alcohol/week: 1.0 standard drink of alcohol    Types: 1 Glasses of wine per week   Drug use: No  [2]  Allergies Allergen Reactions   Oxycodone  Nausea Only   Lisinopril Swelling   Tetanus Toxoid-Containing Vaccines Swelling   "

## 2024-11-19 NOTE — Assessment & Plan Note (Signed)
Avoid contrast unless absolutely necessary. 

## 2025-05-20 ENCOUNTER — Ambulatory Visit (INDEPENDENT_AMBULATORY_CARE_PROVIDER_SITE_OTHER): Admitting: Vascular Surgery

## 2025-05-20 ENCOUNTER — Encounter (INDEPENDENT_AMBULATORY_CARE_PROVIDER_SITE_OTHER)

## 2025-11-18 ENCOUNTER — Other Ambulatory Visit (INDEPENDENT_AMBULATORY_CARE_PROVIDER_SITE_OTHER)

## 2025-11-18 ENCOUNTER — Ambulatory Visit (INDEPENDENT_AMBULATORY_CARE_PROVIDER_SITE_OTHER): Admitting: Vascular Surgery
